# Patient Record
Sex: Male | Born: 1975 | Race: White | Hispanic: No | Smoking: Current every day smoker
Health system: Southern US, Community
[De-identification: ages and names within clinical notes are randomized; demographics above are authoritative.]

## PROBLEM LIST (undated history)

## (undated) DIAGNOSIS — Z8616 Personal history of COVID-19: Secondary | ICD-10-CM

## (undated) DIAGNOSIS — I639 Cerebral infarction, unspecified: Secondary | ICD-10-CM

## (undated) DIAGNOSIS — Z87891 Personal history of nicotine dependence: Secondary | ICD-10-CM

## (undated) DIAGNOSIS — G43909 Migraine, unspecified, not intractable, without status migrainosus: Secondary | ICD-10-CM

## (undated) DIAGNOSIS — Q249 Congenital malformation of heart, unspecified: Secondary | ICD-10-CM

## (undated) DIAGNOSIS — R51 Headache: Secondary | ICD-10-CM

## (undated) DIAGNOSIS — F419 Anxiety disorder, unspecified: Secondary | ICD-10-CM

## (undated) DIAGNOSIS — I10 Essential (primary) hypertension: Secondary | ICD-10-CM

## (undated) DIAGNOSIS — Z8673 Personal history of transient ischemic attack (TIA), and cerebral infarction without residual deficits: Secondary | ICD-10-CM

## (undated) DIAGNOSIS — M542 Cervicalgia: Secondary | ICD-10-CM

## (undated) DIAGNOSIS — Q210 Ventricular septal defect: Secondary | ICD-10-CM

## (undated) DIAGNOSIS — F319 Bipolar disorder, unspecified: Secondary | ICD-10-CM

## (undated) DIAGNOSIS — J449 Chronic obstructive pulmonary disease, unspecified: Secondary | ICD-10-CM

## (undated) DIAGNOSIS — Z720 Tobacco use: Secondary | ICD-10-CM

## (undated) DIAGNOSIS — R519 Headache, unspecified: Secondary | ICD-10-CM

## (undated) DIAGNOSIS — R002 Palpitations: Secondary | ICD-10-CM

## (undated) HISTORY — DX: Palpitations: R00.2

## (undated) HISTORY — PX: AMPUTATION FINGER / THUMB: SUR24

## (undated) HISTORY — DX: Cervicalgia: M54.2

## (undated) HISTORY — DX: Chronic obstructive pulmonary disease, unspecified: J44.9

## (undated) HISTORY — DX: Migraine, unspecified, not intractable, without status migrainosus: G43.909

## (undated) HISTORY — DX: Personal history of transient ischemic attack (TIA), and cerebral infarction without residual deficits: Z86.73

## (undated) HISTORY — DX: Headache, unspecified: R51.9

## (undated) HISTORY — PX: CARDIAC SURGERY: SHX584

## (undated) HISTORY — DX: Personal history of nicotine dependence: Z87.891

## (undated) HISTORY — DX: Tobacco use: Z72.0

## (undated) HISTORY — DX: Congenital malformation of heart, unspecified: Q24.9

## (undated) HISTORY — DX: Headache: R51

## (undated) HISTORY — DX: Bipolar disorder, unspecified: F31.9

## (undated) HISTORY — DX: Essential (primary) hypertension: I10

## (undated) HISTORY — DX: Personal history of COVID-19: Z86.16

## (undated) HISTORY — DX: Ventricular septal defect: Q21.0

## (undated) HISTORY — DX: Anxiety disorder, unspecified: F41.9

---

## 1997-12-09 ENCOUNTER — Emergency Department (HOSPITAL_COMMUNITY): Admission: EM | Admit: 1997-12-09 | Discharge: 1997-12-09 | Payer: Self-pay

## 2000-02-18 ENCOUNTER — Emergency Department (HOSPITAL_COMMUNITY): Admission: EM | Admit: 2000-02-18 | Discharge: 2000-02-18 | Payer: Self-pay | Admitting: Emergency Medicine

## 2001-01-30 ENCOUNTER — Encounter: Payer: Self-pay | Admitting: Emergency Medicine

## 2001-01-30 ENCOUNTER — Emergency Department (HOSPITAL_COMMUNITY): Admission: EM | Admit: 2001-01-30 | Discharge: 2001-01-30 | Payer: Self-pay | Admitting: Emergency Medicine

## 2001-03-22 ENCOUNTER — Emergency Department (HOSPITAL_COMMUNITY): Admission: EM | Admit: 2001-03-22 | Discharge: 2001-03-23 | Payer: Self-pay | Admitting: Emergency Medicine

## 2001-03-23 ENCOUNTER — Encounter: Payer: Self-pay | Admitting: Emergency Medicine

## 2001-09-23 ENCOUNTER — Encounter: Payer: Self-pay | Admitting: Emergency Medicine

## 2001-09-23 ENCOUNTER — Emergency Department (HOSPITAL_COMMUNITY): Admission: EM | Admit: 2001-09-23 | Discharge: 2001-09-23 | Payer: Self-pay | Admitting: Emergency Medicine

## 2001-11-11 ENCOUNTER — Emergency Department (HOSPITAL_COMMUNITY): Admission: EM | Admit: 2001-11-11 | Discharge: 2001-11-11 | Payer: Self-pay | Admitting: Emergency Medicine

## 2001-11-11 ENCOUNTER — Encounter: Payer: Self-pay | Admitting: Emergency Medicine

## 2001-12-14 ENCOUNTER — Emergency Department (HOSPITAL_COMMUNITY): Admission: EM | Admit: 2001-12-14 | Discharge: 2001-12-14 | Payer: Self-pay | Admitting: *Deleted

## 2002-02-01 ENCOUNTER — Encounter: Payer: Self-pay | Admitting: Emergency Medicine

## 2002-02-01 ENCOUNTER — Emergency Department (HOSPITAL_COMMUNITY): Admission: EM | Admit: 2002-02-01 | Discharge: 2002-02-01 | Payer: Self-pay | Admitting: Emergency Medicine

## 2002-02-19 ENCOUNTER — Emergency Department (HOSPITAL_COMMUNITY): Admission: EM | Admit: 2002-02-19 | Discharge: 2002-02-19 | Payer: Self-pay | Admitting: Emergency Medicine

## 2002-02-19 ENCOUNTER — Encounter: Payer: Self-pay | Admitting: Emergency Medicine

## 2002-05-19 ENCOUNTER — Emergency Department (HOSPITAL_COMMUNITY): Admission: EM | Admit: 2002-05-19 | Discharge: 2002-05-19 | Payer: Self-pay | Admitting: Emergency Medicine

## 2002-05-20 ENCOUNTER — Emergency Department (HOSPITAL_COMMUNITY): Admission: EM | Admit: 2002-05-20 | Discharge: 2002-05-20 | Payer: Self-pay | Admitting: Emergency Medicine

## 2002-08-02 ENCOUNTER — Emergency Department (HOSPITAL_COMMUNITY): Admission: EM | Admit: 2002-08-02 | Discharge: 2002-08-02 | Payer: Self-pay | Admitting: Emergency Medicine

## 2002-08-02 ENCOUNTER — Encounter: Payer: Self-pay | Admitting: Emergency Medicine

## 2002-09-25 ENCOUNTER — Emergency Department (HOSPITAL_COMMUNITY): Admission: EM | Admit: 2002-09-25 | Discharge: 2002-09-25 | Payer: Self-pay | Admitting: Emergency Medicine

## 2002-09-25 ENCOUNTER — Encounter: Payer: Self-pay | Admitting: Emergency Medicine

## 2002-10-10 ENCOUNTER — Encounter: Payer: Self-pay | Admitting: Emergency Medicine

## 2002-10-10 ENCOUNTER — Emergency Department (HOSPITAL_COMMUNITY): Admission: EM | Admit: 2002-10-10 | Discharge: 2002-10-10 | Payer: Self-pay | Admitting: Emergency Medicine

## 2003-08-25 ENCOUNTER — Emergency Department (HOSPITAL_COMMUNITY): Admission: EM | Admit: 2003-08-25 | Discharge: 2003-08-25 | Payer: Self-pay | Admitting: Emergency Medicine

## 2003-08-25 IMAGING — CR DG CHEST 2V
2 series · 2 of 2 positions shown · non-contrast
Comparison: [DATE].

CLINICAL DATA: Cough, fever, cold.
 CHEST, TWO VIEWS [DATE]

[view not recorded (1 of 2)]
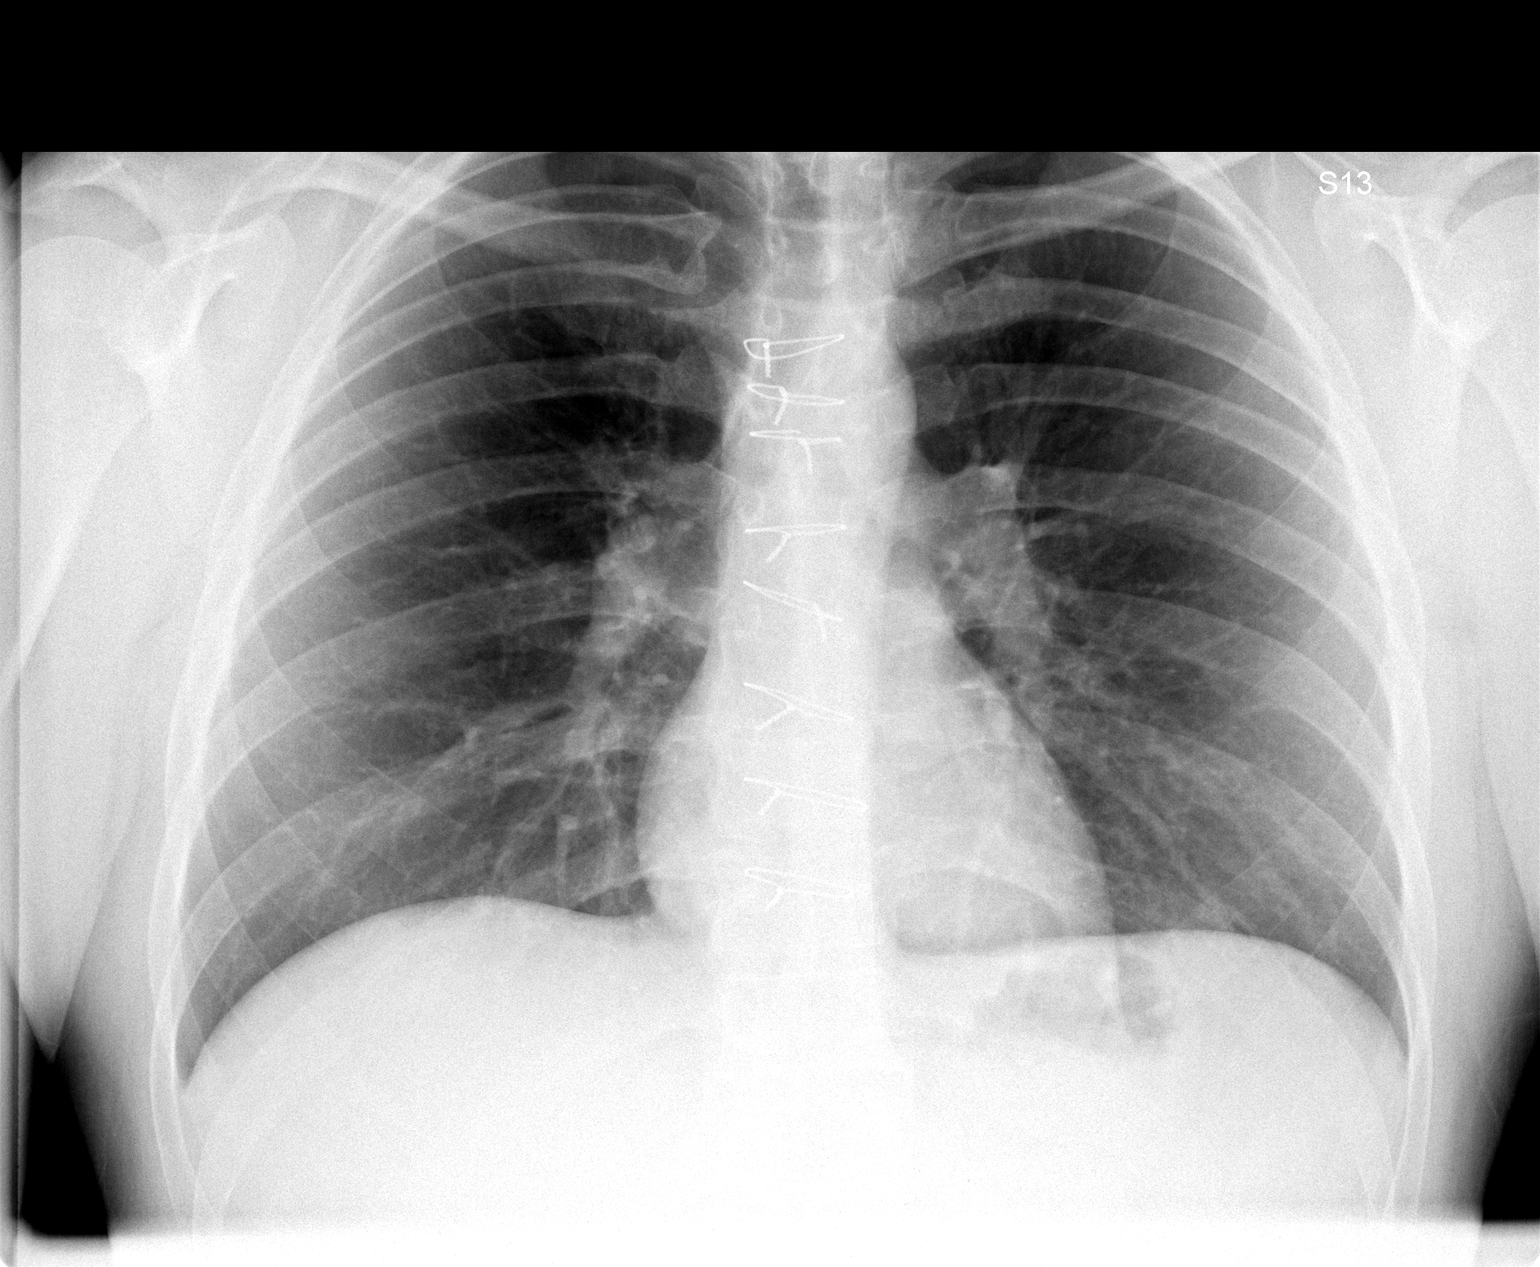

[view not recorded (2 of 2)]
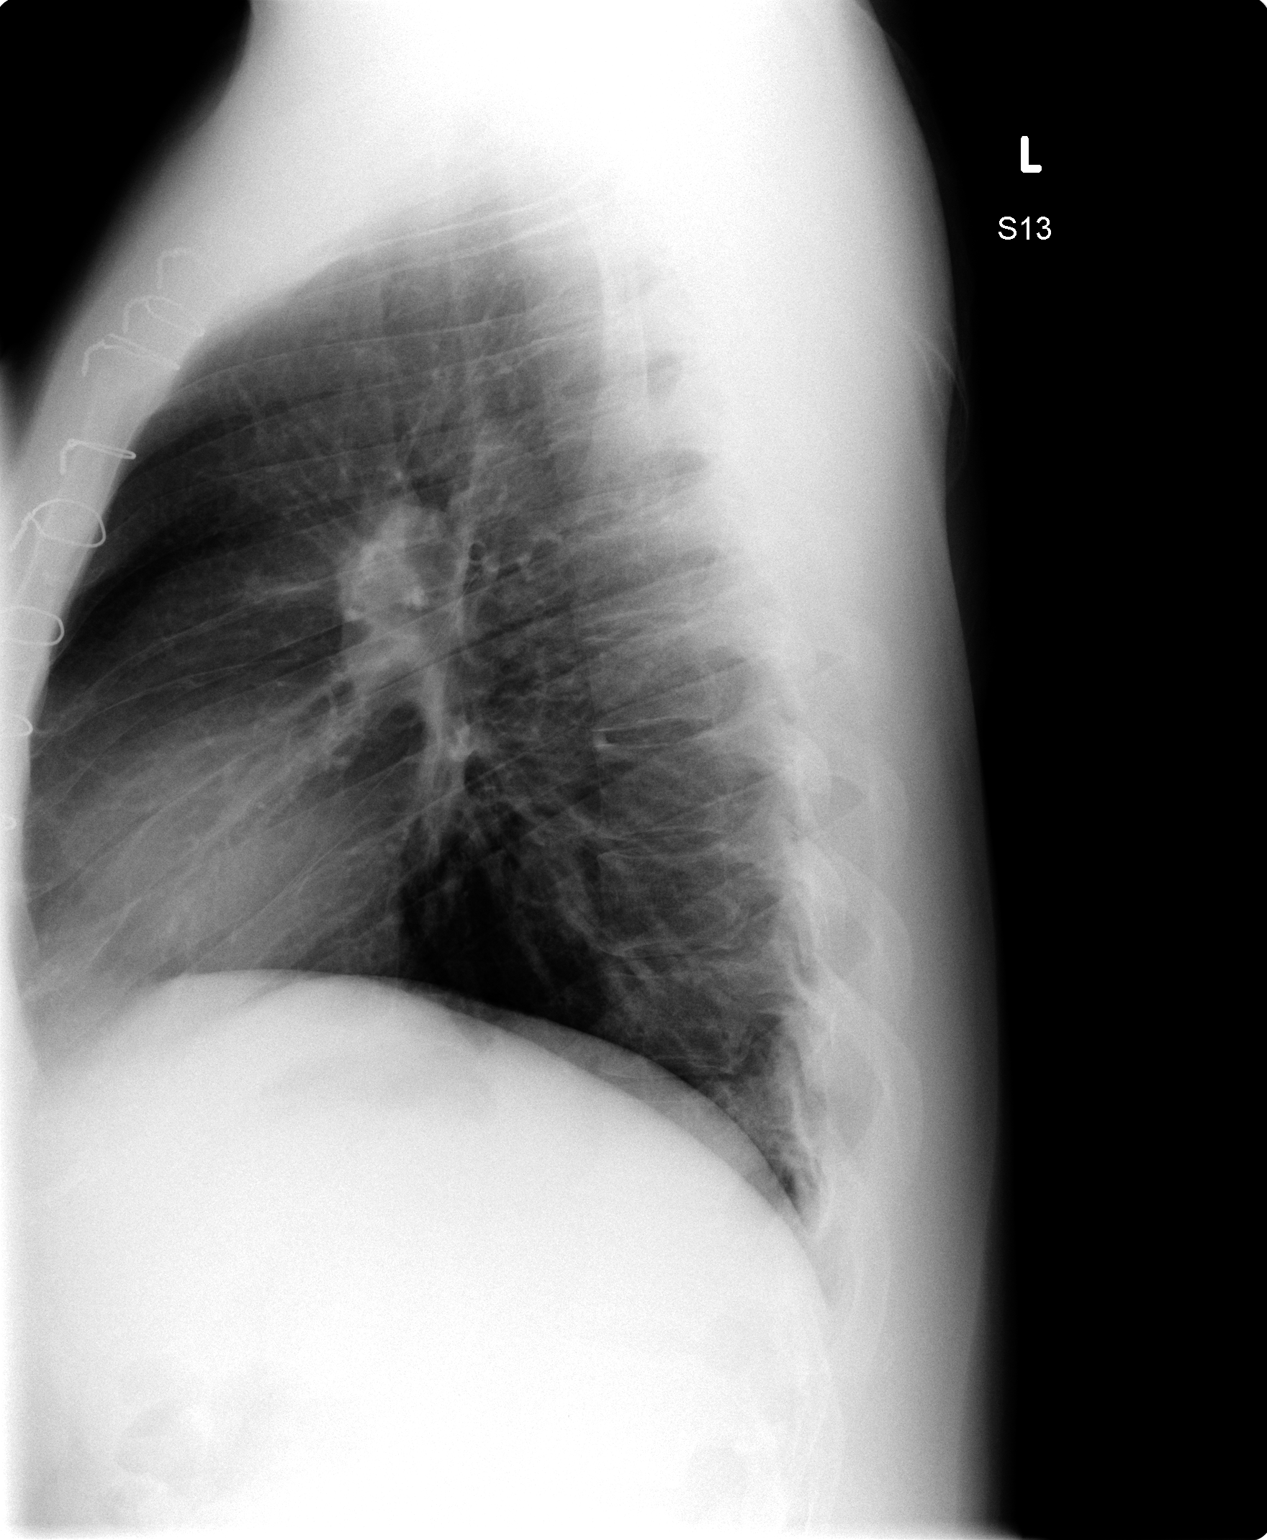

[2 of 2 positions shown; findings below may reference images not displayed]

The patient is status post median sternotomy.  The heart is normal in size.  The lungs are clear.  No effusions.  Visualized skeleton is unremarkable. 
 IMPRESSION
 Status post median sternotomy.  No active disease.

## 2003-11-03 ENCOUNTER — Emergency Department (HOSPITAL_COMMUNITY): Admission: EM | Admit: 2003-11-03 | Discharge: 2003-11-03 | Payer: Self-pay | Admitting: Emergency Medicine

## 2004-02-10 ENCOUNTER — Emergency Department (HOSPITAL_COMMUNITY): Admission: EM | Admit: 2004-02-10 | Discharge: 2004-02-10 | Payer: Self-pay | Admitting: Emergency Medicine

## 2009-04-16 ENCOUNTER — Ambulatory Visit (HOSPITAL_COMMUNITY): Payer: Self-pay | Admitting: Licensed Clinical Social Worker

## 2009-04-30 ENCOUNTER — Ambulatory Visit (HOSPITAL_COMMUNITY): Payer: Self-pay | Admitting: Licensed Clinical Social Worker

## 2009-05-31 ENCOUNTER — Ambulatory Visit (HOSPITAL_COMMUNITY): Payer: Self-pay | Admitting: Licensed Clinical Social Worker

## 2009-06-30 ENCOUNTER — Ambulatory Visit (HOSPITAL_COMMUNITY): Payer: Self-pay | Admitting: Psychiatry

## 2011-01-28 ENCOUNTER — Emergency Department (HOSPITAL_COMMUNITY)
Admission: EM | Admit: 2011-01-28 | Discharge: 2011-01-28 | Disposition: A | Discharge status: Home / Self Care | Payer: Medicare Other | Attending: Emergency Medicine | Admitting: Emergency Medicine

## 2011-01-28 ENCOUNTER — Emergency Department (HOSPITAL_COMMUNITY): Payer: Medicare Other

## 2011-01-28 DIAGNOSIS — J189 Pneumonia, unspecified organism: Secondary | ICD-10-CM | POA: Insufficient documentation

## 2011-01-28 DIAGNOSIS — I252 Old myocardial infarction: Secondary | ICD-10-CM | POA: Insufficient documentation

## 2011-01-28 DIAGNOSIS — Z86718 Personal history of other venous thrombosis and embolism: Secondary | ICD-10-CM | POA: Insufficient documentation

## 2011-01-28 DIAGNOSIS — R509 Fever, unspecified: Secondary | ICD-10-CM | POA: Insufficient documentation

## 2011-01-28 DIAGNOSIS — Z7982 Long term (current) use of aspirin: Secondary | ICD-10-CM | POA: Insufficient documentation

## 2011-01-28 DIAGNOSIS — IMO0001 Reserved for inherently not codable concepts without codable children: Secondary | ICD-10-CM | POA: Insufficient documentation

## 2011-01-28 LAB — CBC
Hemoglobin: 15 g/dL (ref 13.0–17.0)
MCH: 30.5 pg (ref 26.0–34.0)
MCHC: 35.7 g/dL (ref 30.0–36.0)
MCV: 85.5 fL (ref 78.0–100.0)
Platelets: 150 10*3/uL (ref 150–400)
RBC: 4.91 MIL/uL (ref 4.22–5.81)

## 2011-01-28 LAB — DIFFERENTIAL
Eosinophils Absolute: 0.3 10*3/uL (ref 0.0–0.7)
Lymphs Abs: 1.3 10*3/uL (ref 0.7–4.0)
Monocytes Absolute: 1.4 10*3/uL — ABNORMAL HIGH (ref 0.1–1.0)
Monocytes Relative: 13 % — ABNORMAL HIGH (ref 3–12)
Neutrophils Relative %: 73 % (ref 43–77)

## 2013-07-14 DIAGNOSIS — M545 Low back pain, unspecified: Secondary | ICD-10-CM

## 2013-07-14 HISTORY — DX: Low back pain, unspecified: M54.50

## 2014-08-19 DIAGNOSIS — S29019A Strain of muscle and tendon of unspecified wall of thorax, initial encounter: Secondary | ICD-10-CM | POA: Diagnosis not present

## 2014-08-19 DIAGNOSIS — Y999 Unspecified external cause status: Secondary | ICD-10-CM | POA: Diagnosis not present

## 2014-08-19 DIAGNOSIS — Z7982 Long term (current) use of aspirin: Secondary | ICD-10-CM | POA: Diagnosis not present

## 2014-08-19 DIAGNOSIS — I10 Essential (primary) hypertension: Secondary | ICD-10-CM | POA: Diagnosis not present

## 2015-01-28 DIAGNOSIS — J0101 Acute recurrent maxillary sinusitis: Secondary | ICD-10-CM | POA: Diagnosis not present

## 2015-01-28 DIAGNOSIS — I252 Old myocardial infarction: Secondary | ICD-10-CM | POA: Diagnosis not present

## 2015-01-28 DIAGNOSIS — F17210 Nicotine dependence, cigarettes, uncomplicated: Secondary | ICD-10-CM | POA: Diagnosis not present

## 2015-01-28 DIAGNOSIS — Z720 Tobacco use: Secondary | ICD-10-CM | POA: Diagnosis not present

## 2015-01-28 DIAGNOSIS — Z8673 Personal history of transient ischemic attack (TIA), and cerebral infarction without residual deficits: Secondary | ICD-10-CM | POA: Diagnosis not present

## 2015-01-28 DIAGNOSIS — Z7982 Long term (current) use of aspirin: Secondary | ICD-10-CM | POA: Diagnosis not present

## 2015-01-28 DIAGNOSIS — Z89011 Acquired absence of right thumb: Secondary | ICD-10-CM | POA: Diagnosis not present

## 2015-02-01 ENCOUNTER — Encounter (HOSPITAL_COMMUNITY): Payer: Self-pay | Admitting: *Deleted

## 2015-02-01 ENCOUNTER — Emergency Department (HOSPITAL_COMMUNITY)
Admission: EM | Admit: 2015-02-01 | Discharge: 2015-02-01 | Disposition: A | Discharge status: Home / Self Care | Payer: Commercial Managed Care - HMO | Attending: Emergency Medicine | Admitting: Emergency Medicine

## 2015-02-01 DIAGNOSIS — Q249 Congenital malformation of heart, unspecified: Secondary | ICD-10-CM | POA: Insufficient documentation

## 2015-02-01 DIAGNOSIS — Z720 Tobacco use: Secondary | ICD-10-CM | POA: Diagnosis not present

## 2015-02-01 DIAGNOSIS — K088 Other specified disorders of teeth and supporting structures: Secondary | ICD-10-CM | POA: Insufficient documentation

## 2015-02-01 DIAGNOSIS — Z8673 Personal history of transient ischemic attack (TIA), and cerebral infarction without residual deficits: Secondary | ICD-10-CM | POA: Diagnosis not present

## 2015-02-01 DIAGNOSIS — K0889 Other specified disorders of teeth and supporting structures: Secondary | ICD-10-CM

## 2015-02-01 HISTORY — DX: Cerebral infarction, unspecified: I63.9

## 2015-02-01 HISTORY — DX: Congenital malformation of heart, unspecified: Q24.9

## 2015-02-01 NOTE — ED Notes (Signed)
PT family reports 3-2 week Hx of dental pain. Pt also having a sinus infection. Pt is currently taking anti BX for the sinus infection. Pt was last seen at [facility].

## 2015-02-01 NOTE — ED Provider Notes (Signed)
CSN: [id number]     Arrival date & time 02/01/15  1006 History  This chart was scribed for non-physician practitioner, Montine Circle, PA-C working with Quintella Reichert, MD by Hansel Feinstein, ED scribe. This patient was seen in room TR09C/TR09C and the patient's care was started at 11:16 AM    Chief Complaint  Patient presents with  . Dental Problem   The history is provided by the patient. No language interpreter was used.    HPI Comments: Roy Koch is a 39 y.o. male who presents to the Emergency Department with a chief complaint of moderate lower left dental pain onset 3 weeks ago. He is currently on 500mg  amoxicillin for a sinus infection. Pt has also tried warm compress, taken 800mg  ibuprofen, Vicodin, motrin with no relief. Pt is not currently followed by a dentist. He denies fever, other symptoms.   Past Medical History  Diagnosis Date  . Stroke   . Cardiac abnormality    Past Surgical History  Procedure Laterality Date  . Cardiac surgery  at 39 years old   History reviewed. No pertinent family history. Social History  Substance Use Topics  . Smoking status: Current Every Day Smoker    Types: Cigarettes  . Smokeless tobacco: Never Used  . Alcohol Use: No    Review of Systems  Constitutional: Negative for fever.  HENT: Positive for dental problem.    Allergies  Review of patient's allergies indicates no known allergies.  Home Medications   Prior to Admission medications   Not on File   BP 138/98 mmHg  Pulse 61  Temp(Src) 97.9 F (36.6 C) (Oral)  Resp 12  Ht 6' (1.829 m)  Wt 220 lb (99.791 kg)  BMI 29.83 kg/m2  SpO2 96% Physical Exam  Constitutional: He is oriented to person, place, and time. He appears well-developed and well-nourished.  HENT:  Head: Normocephalic and atraumatic.  Terrible dentition throughout  No signs of peritonsillar or tonsillar abscess.  No signs of gingival abscess. Oropharynx is clear and without exudates.  Uvula is midline.  Airway  is intact. No signs of Ludwig's angina with palpation of oral and sublingual mucosa.   Eyes: Conjunctivae and EOM are normal. Pupils are equal, round, and reactive to light.  Neck: Normal range of motion. Neck supple.  Cardiovascular: Normal rate.   Pulmonary/Chest: Effort normal. No respiratory distress.  Abdominal: He exhibits no distension.  Musculoskeletal: Normal range of motion.  Neurological: He is alert and oriented to person, place, and time.  Skin: Skin is warm and dry.  Psychiatric: He has a normal mood and affect. His behavior is normal.  Nursing note and vitals reviewed.  ED Course  Dental Date/Time: 02/01/2015 11:38 AM Performed by: Montine Circle Authorized by: Montine Circle Consent: Verbal consent obtained. Risks and benefits: risks, benefits and alternatives were discussed Consent given by: patient Patient understanding: patient states understanding of the procedure being performed Patient consent: the patient's understanding of the procedure matches consent given Procedure consent: procedure consent matches procedure scheduled Relevant documents: relevant documents present and verified Test results: test results available and properly labeled Site marked: the operative site was marked Imaging studies: imaging studies available Required items: required blood products, implants, devices, and special equipment available Patient identity confirmed: verbally with patient Time out: Immediately prior to procedure a "time out" was called to verify the correct patient, procedure, equipment, support staff and site/side marked as required. Local anesthesia used: yes Anesthesia: local infiltration Local anesthetic: bupivacaine 0.25% with epinephrine  Anesthetic total: 1.8 ml Patient sedated: no Patient tolerance: Patient tolerated the procedure well with no immediate complications   (including critical care time) DIAGNOSTIC STUDIES: Oxygen Saturation is 96% on RA,  adequate by my interpretation.    COORDINATION OF CARE: 11:18 AM Discussed treatment plan with pt at bedside and pt agreed to plan.     MDM   Final diagnoses:  Pain, dental    Patient with toothache.  No gross abscess.  Exam unconcerning for Ludwig's angina or spread of infection.  Patient currently being treated with amoxicillin. Request dental block.  Urged patient to follow-up with dentist.    I personally performed the services described in this documentation, which was scribed in my presence. The recorded information has been reviewed and is accurate.    Adon Gehlhausen, PA-C 02/01/15 [place], MD 02/01/15 [phone]

## 2015-02-01 NOTE — ED Notes (Signed)
PA at the bedside.

## 2015-02-01 NOTE — Discharge Instructions (Signed)

## 2015-02-01 NOTE — ED Notes (Signed)
Declined W/C at D/C and was escorted to lobby by RN. 

## 2015-02-22 DIAGNOSIS — I252 Old myocardial infarction: Secondary | ICD-10-CM | POA: Diagnosis not present

## 2015-02-22 DIAGNOSIS — F17210 Nicotine dependence, cigarettes, uncomplicated: Secondary | ICD-10-CM | POA: Diagnosis not present

## 2015-02-22 DIAGNOSIS — K088 Other specified disorders of teeth and supporting structures: Secondary | ICD-10-CM | POA: Diagnosis not present

## 2015-02-22 DIAGNOSIS — Z8673 Personal history of transient ischemic attack (TIA), and cerebral infarction without residual deficits: Secondary | ICD-10-CM | POA: Diagnosis not present

## 2015-02-22 DIAGNOSIS — Z7982 Long term (current) use of aspirin: Secondary | ICD-10-CM | POA: Diagnosis not present

## 2015-04-19 DIAGNOSIS — J449 Chronic obstructive pulmonary disease, unspecified: Secondary | ICD-10-CM | POA: Diagnosis not present

## 2015-04-19 DIAGNOSIS — Z6830 Body mass index (BMI) 30.0-30.9, adult: Secondary | ICD-10-CM | POA: Diagnosis not present

## 2015-04-19 DIAGNOSIS — J019 Acute sinusitis, unspecified: Secondary | ICD-10-CM | POA: Diagnosis not present

## 2015-04-19 DIAGNOSIS — Z1389 Encounter for screening for other disorder: Secondary | ICD-10-CM | POA: Diagnosis not present

## 2015-05-11 DIAGNOSIS — Z6829 Body mass index (BMI) 29.0-29.9, adult: Secondary | ICD-10-CM | POA: Diagnosis not present

## 2015-05-11 DIAGNOSIS — F12920 Cannabis use, unspecified with intoxication, uncomplicated: Secondary | ICD-10-CM | POA: Diagnosis not present

## 2015-05-11 DIAGNOSIS — R030 Elevated blood-pressure reading, without diagnosis of hypertension: Secondary | ICD-10-CM | POA: Diagnosis not present

## 2015-05-11 DIAGNOSIS — F3110 Bipolar disorder, current episode manic without psychotic features, unspecified: Secondary | ICD-10-CM | POA: Diagnosis not present

## 2015-05-11 DIAGNOSIS — Z0000 Encounter for general adult medical examination without abnormal findings: Secondary | ICD-10-CM | POA: Diagnosis not present

## 2015-05-11 DIAGNOSIS — S68511S Complete traumatic transphalangeal amputation of right thumb, sequela: Secondary | ICD-10-CM | POA: Diagnosis not present

## 2015-05-11 DIAGNOSIS — Z8673 Personal history of transient ischemic attack (TIA), and cerebral infarction without residual deficits: Secondary | ICD-10-CM | POA: Diagnosis not present

## 2015-05-11 DIAGNOSIS — J449 Chronic obstructive pulmonary disease, unspecified: Secondary | ICD-10-CM | POA: Diagnosis not present

## 2015-06-11 DIAGNOSIS — Z6830 Body mass index (BMI) 30.0-30.9, adult: Secondary | ICD-10-CM | POA: Diagnosis not present

## 2015-06-11 DIAGNOSIS — J441 Chronic obstructive pulmonary disease with (acute) exacerbation: Secondary | ICD-10-CM | POA: Diagnosis not present

## 2015-06-11 DIAGNOSIS — Z1389 Encounter for screening for other disorder: Secondary | ICD-10-CM | POA: Diagnosis not present

## 2015-06-11 DIAGNOSIS — E669 Obesity, unspecified: Secondary | ICD-10-CM | POA: Diagnosis not present

## 2015-06-11 DIAGNOSIS — Z720 Tobacco use: Secondary | ICD-10-CM | POA: Diagnosis not present

## 2015-06-23 DIAGNOSIS — J40 Bronchitis, not specified as acute or chronic: Secondary | ICD-10-CM | POA: Diagnosis not present

## 2015-06-23 DIAGNOSIS — R071 Chest pain on breathing: Secondary | ICD-10-CM | POA: Diagnosis not present

## 2015-06-23 DIAGNOSIS — Z720 Tobacco use: Secondary | ICD-10-CM | POA: Diagnosis not present

## 2015-06-23 DIAGNOSIS — F17210 Nicotine dependence, cigarettes, uncomplicated: Secondary | ICD-10-CM | POA: Diagnosis not present

## 2015-06-23 DIAGNOSIS — R0789 Other chest pain: Secondary | ICD-10-CM | POA: Diagnosis not present

## 2015-06-23 DIAGNOSIS — R05 Cough: Secondary | ICD-10-CM | POA: Diagnosis not present

## 2015-06-23 DIAGNOSIS — Z7982 Long term (current) use of aspirin: Secondary | ICD-10-CM | POA: Diagnosis not present

## 2015-06-23 DIAGNOSIS — Z8673 Personal history of transient ischemic attack (TIA), and cerebral infarction without residual deficits: Secondary | ICD-10-CM | POA: Diagnosis not present

## 2015-06-23 DIAGNOSIS — I252 Old myocardial infarction: Secondary | ICD-10-CM | POA: Diagnosis not present

## 2015-06-23 DIAGNOSIS — I4510 Unspecified right bundle-branch block: Secondary | ICD-10-CM | POA: Diagnosis not present

## 2015-06-24 DIAGNOSIS — R05 Cough: Secondary | ICD-10-CM | POA: Diagnosis not present

## 2015-08-21 DIAGNOSIS — S2231XA Fracture of one rib, right side, initial encounter for closed fracture: Secondary | ICD-10-CM | POA: Diagnosis not present

## 2015-08-21 DIAGNOSIS — I252 Old myocardial infarction: Secondary | ICD-10-CM | POA: Diagnosis not present

## 2015-08-21 DIAGNOSIS — Z8673 Personal history of transient ischemic attack (TIA), and cerebral infarction without residual deficits: Secondary | ICD-10-CM | POA: Diagnosis not present

## 2015-08-21 DIAGNOSIS — Z7982 Long term (current) use of aspirin: Secondary | ICD-10-CM | POA: Diagnosis not present

## 2015-08-21 DIAGNOSIS — X58XXXA Exposure to other specified factors, initial encounter: Secondary | ICD-10-CM | POA: Diagnosis not present

## 2015-08-21 DIAGNOSIS — J441 Chronic obstructive pulmonary disease with (acute) exacerbation: Secondary | ICD-10-CM | POA: Diagnosis not present

## 2015-08-21 DIAGNOSIS — F17210 Nicotine dependence, cigarettes, uncomplicated: Secondary | ICD-10-CM | POA: Diagnosis not present

## 2015-08-21 DIAGNOSIS — Z720 Tobacco use: Secondary | ICD-10-CM | POA: Diagnosis not present

## 2015-08-22 DIAGNOSIS — S2231XA Fracture of one rib, right side, initial encounter for closed fracture: Secondary | ICD-10-CM | POA: Diagnosis not present

## 2015-08-31 DIAGNOSIS — S2231XA Fracture of one rib, right side, initial encounter for closed fracture: Secondary | ICD-10-CM | POA: Diagnosis not present

## 2015-08-31 DIAGNOSIS — Z6831 Body mass index (BMI) 31.0-31.9, adult: Secondary | ICD-10-CM | POA: Diagnosis not present

## 2015-08-31 DIAGNOSIS — J449 Chronic obstructive pulmonary disease, unspecified: Secondary | ICD-10-CM | POA: Diagnosis not present

## 2015-10-28 DIAGNOSIS — F17210 Nicotine dependence, cigarettes, uncomplicated: Secondary | ICD-10-CM | POA: Diagnosis not present

## 2015-10-28 DIAGNOSIS — K047 Periapical abscess without sinus: Secondary | ICD-10-CM | POA: Diagnosis not present

## 2015-10-28 DIAGNOSIS — K0889 Other specified disorders of teeth and supporting structures: Secondary | ICD-10-CM | POA: Diagnosis not present

## 2015-10-28 DIAGNOSIS — I252 Old myocardial infarction: Secondary | ICD-10-CM | POA: Diagnosis not present

## 2015-10-28 DIAGNOSIS — Z8673 Personal history of transient ischemic attack (TIA), and cerebral infarction without residual deficits: Secondary | ICD-10-CM | POA: Diagnosis not present

## 2015-10-28 DIAGNOSIS — Z9889 Other specified postprocedural states: Secondary | ICD-10-CM | POA: Diagnosis not present

## 2015-10-28 DIAGNOSIS — Z886 Allergy status to analgesic agent status: Secondary | ICD-10-CM | POA: Diagnosis not present

## 2015-10-28 DIAGNOSIS — Z89011 Acquired absence of right thumb: Secondary | ICD-10-CM | POA: Diagnosis not present

## 2015-10-28 DIAGNOSIS — H65191 Other acute nonsuppurative otitis media, right ear: Secondary | ICD-10-CM | POA: Diagnosis not present

## 2015-12-25 DIAGNOSIS — I252 Old myocardial infarction: Secondary | ICD-10-CM | POA: Diagnosis not present

## 2015-12-25 DIAGNOSIS — M50323 Other cervical disc degeneration at C6-C7 level: Secondary | ICD-10-CM | POA: Diagnosis not present

## 2015-12-25 DIAGNOSIS — X500XXA Overexertion from strenuous movement or load, initial encounter: Secondary | ICD-10-CM | POA: Diagnosis not present

## 2015-12-25 DIAGNOSIS — M47814 Spondylosis without myelopathy or radiculopathy, thoracic region: Secondary | ICD-10-CM | POA: Diagnosis not present

## 2015-12-25 DIAGNOSIS — S29012A Strain of muscle and tendon of back wall of thorax, initial encounter: Secondary | ICD-10-CM | POA: Diagnosis not present

## 2015-12-25 DIAGNOSIS — S161XXA Strain of muscle, fascia and tendon at neck level, initial encounter: Secondary | ICD-10-CM | POA: Diagnosis not present

## 2015-12-25 DIAGNOSIS — F17210 Nicotine dependence, cigarettes, uncomplicated: Secondary | ICD-10-CM | POA: Diagnosis not present

## 2015-12-25 DIAGNOSIS — Z9889 Other specified postprocedural states: Secondary | ICD-10-CM | POA: Diagnosis not present

## 2015-12-25 DIAGNOSIS — M5134 Other intervertebral disc degeneration, thoracic region: Secondary | ICD-10-CM | POA: Diagnosis not present

## 2015-12-25 DIAGNOSIS — Z8673 Personal history of transient ischemic attack (TIA), and cerebral infarction without residual deficits: Secondary | ICD-10-CM | POA: Diagnosis not present

## 2015-12-25 DIAGNOSIS — S29019A Strain of muscle and tendon of unspecified wall of thorax, initial encounter: Secondary | ICD-10-CM | POA: Diagnosis not present

## 2016-01-23 DIAGNOSIS — I252 Old myocardial infarction: Secondary | ICD-10-CM | POA: Diagnosis not present

## 2016-01-23 DIAGNOSIS — F17210 Nicotine dependence, cigarettes, uncomplicated: Secondary | ICD-10-CM | POA: Diagnosis not present

## 2016-01-23 DIAGNOSIS — J209 Acute bronchitis, unspecified: Secondary | ICD-10-CM | POA: Diagnosis not present

## 2016-01-23 DIAGNOSIS — Z7982 Long term (current) use of aspirin: Secondary | ICD-10-CM | POA: Diagnosis not present

## 2016-01-23 DIAGNOSIS — Z8673 Personal history of transient ischemic attack (TIA), and cerebral infarction without residual deficits: Secondary | ICD-10-CM | POA: Diagnosis not present

## 2016-01-23 DIAGNOSIS — Z720 Tobacco use: Secondary | ICD-10-CM | POA: Diagnosis not present

## 2016-01-23 DIAGNOSIS — R0602 Shortness of breath: Secondary | ICD-10-CM | POA: Diagnosis not present

## 2016-01-27 DIAGNOSIS — Z79899 Other long term (current) drug therapy: Secondary | ICD-10-CM | POA: Diagnosis not present

## 2016-01-27 DIAGNOSIS — M5412 Radiculopathy, cervical region: Secondary | ICD-10-CM | POA: Diagnosis not present

## 2016-01-27 DIAGNOSIS — J441 Chronic obstructive pulmonary disease with (acute) exacerbation: Secondary | ICD-10-CM | POA: Diagnosis not present

## 2016-01-27 DIAGNOSIS — M25511 Pain in right shoulder: Secondary | ICD-10-CM | POA: Diagnosis not present

## 2016-01-27 DIAGNOSIS — G4700 Insomnia, unspecified: Secondary | ICD-10-CM | POA: Diagnosis not present

## 2016-08-17 DIAGNOSIS — J069 Acute upper respiratory infection, unspecified: Secondary | ICD-10-CM | POA: Diagnosis not present

## 2016-08-17 DIAGNOSIS — J029 Acute pharyngitis, unspecified: Secondary | ICD-10-CM | POA: Diagnosis not present

## 2016-08-17 DIAGNOSIS — Z6831 Body mass index (BMI) 31.0-31.9, adult: Secondary | ICD-10-CM | POA: Diagnosis not present

## 2016-08-17 DIAGNOSIS — J020 Streptococcal pharyngitis: Secondary | ICD-10-CM | POA: Diagnosis not present

## 2016-12-23 DIAGNOSIS — R9431 Abnormal electrocardiogram [ECG] [EKG]: Secondary | ICD-10-CM | POA: Diagnosis not present

## 2016-12-23 DIAGNOSIS — R0789 Other chest pain: Secondary | ICD-10-CM | POA: Diagnosis not present

## 2016-12-23 DIAGNOSIS — I252 Old myocardial infarction: Secondary | ICD-10-CM | POA: Diagnosis not present

## 2016-12-23 DIAGNOSIS — J984 Other disorders of lung: Secondary | ICD-10-CM | POA: Diagnosis not present

## 2016-12-23 DIAGNOSIS — F17210 Nicotine dependence, cigarettes, uncomplicated: Secondary | ICD-10-CM | POA: Diagnosis not present

## 2016-12-23 DIAGNOSIS — Z7982 Long term (current) use of aspirin: Secondary | ICD-10-CM | POA: Diagnosis not present

## 2016-12-23 DIAGNOSIS — I4510 Unspecified right bundle-branch block: Secondary | ICD-10-CM | POA: Diagnosis not present

## 2016-12-23 DIAGNOSIS — R001 Bradycardia, unspecified: Secondary | ICD-10-CM | POA: Diagnosis not present

## 2016-12-23 DIAGNOSIS — R079 Chest pain, unspecified: Secondary | ICD-10-CM | POA: Diagnosis not present

## 2016-12-23 DIAGNOSIS — J9811 Atelectasis: Secondary | ICD-10-CM | POA: Diagnosis not present

## 2016-12-23 DIAGNOSIS — Z8673 Personal history of transient ischemic attack (TIA), and cerebral infarction without residual deficits: Secondary | ICD-10-CM | POA: Diagnosis not present

## 2017-02-07 DIAGNOSIS — Z6830 Body mass index (BMI) 30.0-30.9, adult: Secondary | ICD-10-CM | POA: Diagnosis not present

## 2017-02-07 DIAGNOSIS — Z716 Tobacco abuse counseling: Secondary | ICD-10-CM | POA: Diagnosis not present

## 2017-02-07 DIAGNOSIS — H6012 Cellulitis of left external ear: Secondary | ICD-10-CM | POA: Diagnosis not present

## 2017-02-12 DIAGNOSIS — H6012 Cellulitis of left external ear: Secondary | ICD-10-CM | POA: Diagnosis not present

## 2017-02-12 DIAGNOSIS — Z6831 Body mass index (BMI) 31.0-31.9, adult: Secondary | ICD-10-CM | POA: Diagnosis not present

## 2017-02-12 DIAGNOSIS — H60392 Other infective otitis externa, left ear: Secondary | ICD-10-CM | POA: Diagnosis not present

## 2017-02-21 DIAGNOSIS — K029 Dental caries, unspecified: Secondary | ICD-10-CM | POA: Diagnosis not present

## 2017-02-21 DIAGNOSIS — Z6830 Body mass index (BMI) 30.0-30.9, adult: Secondary | ICD-10-CM | POA: Diagnosis not present

## 2017-02-21 DIAGNOSIS — Q249 Congenital malformation of heart, unspecified: Secondary | ICD-10-CM | POA: Diagnosis not present

## 2017-03-07 ENCOUNTER — Other Ambulatory Visit: Payer: Self-pay

## 2017-03-07 NOTE — Patient Outreach (Signed)
[facility]) Care Management  03/07/2017  Roy Koch 11-Jun-1975 [id number] TELEPHONE SCREENING Referral date: 02/27/17 Referral source: primary MD  Referral reason: Psych needs, bipolar, intermittent explosive.  Social work needs.  Insurance: [employer]  Telephone call to patient regarding primary MD referral. Contact answering phone states patient is not at home.  HIPAA compliant message left with call back phone number.   PLAN: RNCM will attempt 2nd telephone outreach to patient within 1 week.   Quinn Plowman RN,BSN,CCM [facility] Telephonic  [phone]

## 2017-03-12 ENCOUNTER — Other Ambulatory Visit: Payer: Self-pay

## 2017-03-12 NOTE — Patient Outreach (Signed)
[facility]) Care Management  03/12/2017  Roy Koch Nov 29, 1975 [id number]  TELEPHONE SCREENING Referral date:02/27/17 Referral source: primary MD Referral reason: psych needs, patient has bipolar intermittent explosive disorder, needs social worker follow up   Insurance: [employer] Patient gives verbal authorization to speak with his mother, Roy Koch regarding any of her persona health information. Her contact phone number is [phone]  SUBJECTIVE: Telephone call to patient regarding primary MD referral. HIPAA verified. Discussed and offered [facility] care management services. Patient verbally agreed to services. Patient states he is suppose to see a therapist but  Is having a hard time finding one that handles his type of situation. Patient states he has bipolar intermittent explosive disorder. Patient state he is suppose to be on medications but is not at this time due to not having a therapist.  Patient states he would like to get back on his medications so that he can continueto live by himself.  Patient state he had a therapist but they now only see patients with addiction problems. Patient states he has disability and is able to live by himself. Patient states his mother helps him with paperwork and his bills.  Patient reports he has had heart surgery in the past and has had 2 stroke. Patient states he had a stroke when he was 41 years old. Patient states the doctors feel he developed the bipolar intermittent explosive disorder due to the stroke.  Patient verbally agreed to have [place] work services. Patient denies having any additional needs at this time.   PROVIDERS: Aram Candela, pa  SOCIAL/ SUPPORTIVE CARE: Roy Koch, mother. Patient lives alone.   PLAN: RNCM will refer patient to Education officer, museum.  Quinn Plowman RN,BSN,CCM [facility] Telephonic  [phone]

## 2017-03-30 ENCOUNTER — Ambulatory Visit: Payer: Self-pay | Admitting: *Deleted

## 2017-04-02 ENCOUNTER — Ambulatory Visit: Payer: Self-pay | Admitting: *Deleted

## 2017-04-10 ENCOUNTER — Other Ambulatory Visit: Payer: Self-pay | Admitting: *Deleted

## 2017-04-12 ENCOUNTER — Ambulatory Visit: Payer: Self-pay | Admitting: *Deleted

## 2017-04-25 DIAGNOSIS — M5416 Radiculopathy, lumbar region: Secondary | ICD-10-CM | POA: Diagnosis not present

## 2017-04-25 DIAGNOSIS — Z6831 Body mass index (BMI) 31.0-31.9, adult: Secondary | ICD-10-CM | POA: Diagnosis not present

## 2017-05-24 ENCOUNTER — Other Ambulatory Visit: Payer: Self-pay | Admitting: *Deleted

## 2017-06-01 ENCOUNTER — Other Ambulatory Visit: Payer: Self-pay | Admitting: *Deleted

## 2017-06-04 ENCOUNTER — Other Ambulatory Visit: Payer: Self-pay | Admitting: *Deleted

## 2017-06-04 NOTE — Patient Outreach (Signed)
[facility]) Care Management  06/04/2017  AKSHAT MINEHART [birth year] [id number]   CSW was able to reschedule patient's outpatient appointment with [facility].  At this time he does not want to pursue seeing a counselor but wants to wait to see the Psychiatrist then.   CSW has closed case and patient is aware of how to reach CSW if needs arise.   Eduard Clos, MSW, [place] Worker  [place] [phone]

## 2017-06-15 DIAGNOSIS — Z6831 Body mass index (BMI) 31.0-31.9, adult: Secondary | ICD-10-CM | POA: Diagnosis not present

## 2017-06-15 DIAGNOSIS — J441 Chronic obstructive pulmonary disease with (acute) exacerbation: Secondary | ICD-10-CM | POA: Diagnosis not present

## 2017-06-15 DIAGNOSIS — J019 Acute sinusitis, unspecified: Secondary | ICD-10-CM | POA: Diagnosis not present

## 2017-07-03 ENCOUNTER — Ambulatory Visit (HOSPITAL_COMMUNITY): Payer: Commercial Managed Care - HMO | Admitting: Psychiatry

## 2017-07-10 ENCOUNTER — Other Ambulatory Visit: Payer: Self-pay | Admitting: *Deleted

## 2017-08-06 ENCOUNTER — Other Ambulatory Visit: Payer: Self-pay | Admitting: *Deleted

## 2017-08-07 ENCOUNTER — Ambulatory Visit (HOSPITAL_COMMUNITY): Payer: Self-pay | Admitting: Psychiatry

## 2017-09-13 DIAGNOSIS — K0889 Other specified disorders of teeth and supporting structures: Secondary | ICD-10-CM | POA: Diagnosis not present

## 2017-09-13 DIAGNOSIS — K047 Periapical abscess without sinus: Secondary | ICD-10-CM | POA: Diagnosis not present

## 2017-09-13 DIAGNOSIS — F17210 Nicotine dependence, cigarettes, uncomplicated: Secondary | ICD-10-CM | POA: Diagnosis not present

## 2017-09-15 ENCOUNTER — Other Ambulatory Visit: Payer: Self-pay

## 2017-09-15 ENCOUNTER — Ambulatory Visit (INDEPENDENT_AMBULATORY_CARE_PROVIDER_SITE_OTHER): Payer: Medicare HMO | Admitting: Psychiatry

## 2017-09-15 ENCOUNTER — Encounter (HOSPITAL_COMMUNITY): Payer: Self-pay | Admitting: Psychiatry

## 2017-09-15 VITALS — BP 132/88 | HR 88 | Ht 72.0 in | Wt 220.0 lb

## 2017-09-15 DIAGNOSIS — G4700 Insomnia, unspecified: Secondary | ICD-10-CM

## 2017-09-15 DIAGNOSIS — Z736 Limitation of activities due to disability: Secondary | ICD-10-CM | POA: Diagnosis not present

## 2017-09-15 DIAGNOSIS — F4010 Social phobia, unspecified: Secondary | ICD-10-CM

## 2017-09-15 DIAGNOSIS — Z818 Family history of other mental and behavioral disorders: Secondary | ICD-10-CM | POA: Diagnosis not present

## 2017-09-15 DIAGNOSIS — Z8673 Personal history of transient ischemic attack (TIA), and cerebral infarction without residual deficits: Secondary | ICD-10-CM | POA: Diagnosis not present

## 2017-09-15 DIAGNOSIS — Z634 Disappearance and death of family member: Secondary | ICD-10-CM

## 2017-09-15 DIAGNOSIS — F411 Generalized anxiety disorder: Secondary | ICD-10-CM | POA: Insufficient documentation

## 2017-09-15 DIAGNOSIS — F0630 Mood disorder due to known physiological condition, unspecified: Secondary | ICD-10-CM

## 2017-09-15 DIAGNOSIS — R450 Nervousness: Secondary | ICD-10-CM | POA: Diagnosis not present

## 2017-09-15 DIAGNOSIS — F17210 Nicotine dependence, cigarettes, uncomplicated: Secondary | ICD-10-CM | POA: Diagnosis not present

## 2017-09-15 DIAGNOSIS — I219 Acute myocardial infarction, unspecified: Secondary | ICD-10-CM | POA: Diagnosis not present

## 2017-09-15 HISTORY — DX: Generalized anxiety disorder: F41.1

## 2017-09-15 MED ORDER — ALPRAZOLAM 1 MG PO TABS: 1.0000 mg | ORAL_TABLET | Freq: Two times a day (BID) | ORAL | 1 refills | Status: DC

## 2017-09-15 MED ORDER — SERTRALINE HCL 50 MG PO TABS: 50.0000 mg | ORAL_TABLET | Freq: Every day | ORAL | 1 refills | Status: DC
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
## 2017-11-05 DIAGNOSIS — G8911 Acute pain due to trauma: Secondary | ICD-10-CM | POA: Diagnosis not present

## 2017-11-05 DIAGNOSIS — F17210 Nicotine dependence, cigarettes, uncomplicated: Secondary | ICD-10-CM | POA: Diagnosis not present

## 2017-11-05 DIAGNOSIS — I252 Old myocardial infarction: Secondary | ICD-10-CM | POA: Diagnosis not present

## 2017-11-05 DIAGNOSIS — Z7982 Long term (current) use of aspirin: Secondary | ICD-10-CM | POA: Diagnosis not present

## 2017-11-05 DIAGNOSIS — S098XXA Other specified injuries of head, initial encounter: Secondary | ICD-10-CM | POA: Diagnosis not present

## 2017-11-05 DIAGNOSIS — S0033XA Contusion of nose, initial encounter: Secondary | ICD-10-CM | POA: Diagnosis not present

## 2017-11-05 DIAGNOSIS — Z8673 Personal history of transient ischemic attack (TIA), and cerebral infarction without residual deficits: Secondary | ICD-10-CM | POA: Diagnosis not present

## 2017-11-05 DIAGNOSIS — I10 Essential (primary) hypertension: Secondary | ICD-10-CM | POA: Diagnosis not present

## 2017-11-05 DIAGNOSIS — S0992XA Unspecified injury of nose, initial encounter: Secondary | ICD-10-CM | POA: Diagnosis not present

## 2017-11-06 ENCOUNTER — Encounter (HOSPITAL_COMMUNITY): Payer: Self-pay | Admitting: Psychiatry

## 2017-11-06 ENCOUNTER — Ambulatory Visit (HOSPITAL_COMMUNITY): Payer: Medicare HMO | Admitting: Psychiatry

## 2017-11-06 VITALS — BP 148/82 | HR 75 | Ht 72.0 in | Wt 221.0 lb

## 2017-11-06 DIAGNOSIS — Z818 Family history of other mental and behavioral disorders: Secondary | ICD-10-CM | POA: Diagnosis not present

## 2017-11-06 DIAGNOSIS — F6381 Intermittent explosive disorder: Secondary | ICD-10-CM | POA: Diagnosis not present

## 2017-11-06 DIAGNOSIS — F411 Generalized anxiety disorder: Secondary | ICD-10-CM | POA: Diagnosis not present

## 2017-11-06 DIAGNOSIS — F17200 Nicotine dependence, unspecified, uncomplicated: Secondary | ICD-10-CM | POA: Diagnosis not present

## 2017-11-06 MED ORDER — SERTRALINE HCL 100 MG PO TABS: 100.0000 mg | ORAL_TABLET | Freq: Every day | ORAL | 0 refills | Status: DC

## 2017-11-06 MED ORDER — GUANFACINE HCL 1 MG PO TABS: 1.0000 mg | ORAL_TABLET | Freq: Every day | ORAL | 1 refills | Status: DC

## 2017-11-06 MED ORDER — GUANFACINE HCL 1 MG PO TABS: 1.0000 mg | ORAL_TABLET | Freq: Three times a day (TID) | ORAL | 1 refills | Status: DC

## 2017-11-06 NOTE — Progress Notes (Signed)
[facility] MD/PA/NP OP Progress Note  11/06/2017 4:41 PM Roy Koch  MRN:  [id number]  Chief Complaint: I do not like new things, and I do not tolerate disrespect HPI: Roy Koch presents for a transition of care appointment, I have not met this patient.  I saw that he was seen for an intake assessment, and reviewed this with him.  I reviewed the [place] controlled substance database and prior to being seen here he has not been on Xanax for over 6 years.  I discussed with him that for intermittent explosive disorder, I tend to suggest we start with an alpha modulator such as Tenex, as I have concerns about long-term use and safety for benzodiazepines.  He was quite upset at first, but I realigned with him and suggested that we increase Zoloft and give the Tenex to try to see if this helps.  Reviewed his history which is not consistent with bipolar disorder and is far more consistent with neurological injury at age 55 and subsequent development of personality and behavioral changes.   Spent time with him educating him on the risks of benzodiazepines particularly in individuals with neurological injury.  We agreed to increase Zoloft to 100 mg and initiate Tenex 1 mg 3 times daily.    Patient continued to seek benzodiazepine, Xanax.  I suggested that we follow-up in 6-8 weeks, and we will schedule a transition of his care to an alternative provider at this office.  He is welcome to discuss Xanax with his new provider if they are comfortable with it. Visit Diagnosis:    ICD-10-CM   1. Generalized anxiety disorder F41.1 sertraline (ZOLOFT) 100 MG tablet    guanFACINE (TENEX) 1 MG tablet    DISCONTINUED: guanFACINE (TENEX) 1 MG tablet  2. Intermittent explosive disorder F63.81 sertraline (ZOLOFT) 100 MG tablet    guanFACINE (TENEX) 1 MG tablet    DISCONTINUED: guanFACINE (TENEX) 1 MG tablet    Past Psychiatric History: See intake H&P for full details. Reviewed, with no updates at this  time.   Past Medical History:  Past Medical History:  Diagnosis Date  . Anxiety   . Bipolar disorder ([place])   . Cardiac abnormality   . Headache   . Stroke [facility])     Past Surgical History:  Procedure Laterality Date  . AMPUTATION FINGER / THUMB    . CARDIAC SURGERY  at 41 years old    Family Psychiatric History: See intake H&P for full details. Reviewed, with no updates at this time.   Family History:  Family History  Problem Relation Age of Onset  . Schizophrenia Father   . Bipolar disorder Maternal Uncle   . Bipolar disorder Maternal Grandmother     Social History:  Social History   Socioeconomic History  . Marital status: Divorced    Spouse name: Not on file  . Number of children: Not on file  . Years of education: Not on file  . Highest education level: Not on file  Occupational History  . Not on file  Social Needs  . Financial resource strain: Not on file  . Food insecurity:    Worry: Not on file    Inability: Not on file  . Transportation needs:    Medical: Not on file    Non-medical: Not on file  Tobacco Use  . Smoking status: Current Every Day Smoker    Packs/day: 2.00    Types: Cigarettes  . Smokeless tobacco: Never Used  Substance and  Sexual Activity  . Alcohol use: No  . Drug use: No  . Sexual activity: Not on file  Lifestyle  . Physical activity:    Days per week: Not on file    Minutes per session: Not on file  . Stress: Not on file  Relationships  . Social connections:    Talks on phone: Not on file    Gets together: Not on file    Attends religious service: Not on file    Active member of club or organization: Not on file    Attends meetings of clubs or organizations: Not on file    Relationship status: Not on file  Other Topics Concern  . Not on file  Social History Narrative  . Not on file    Allergies: No Known Allergies  Metabolic Disorder Labs: No results found for: HGBA1C, MPG No results found for: PROLACTIN No  results found for: CHOL, TRIG, HDL, CHOLHDL, VLDL, LDLCALC No results found for: TSH  Therapeutic Level Labs: No results found for: LITHIUM No results found for: VALPROATE No components found for:  CBMZ  Current Medications: Current Outpatient Medications  Medication Sig Dispense Refill  . aspirin 325 MG tablet Take by mouth.    Marland Kitchen HYDROcodone-acetaminophen (NORCO/VICODIN) 5-325 MG tablet Take by mouth.    . sertraline (ZOLOFT) 100 MG tablet Take 1 tablet (100 mg total) by mouth daily. 90 tablet 0  . guanFACINE (TENEX) 1 MG tablet Take 1 tablet (1 mg total) by mouth 3 (three) times daily. 90 tablet 1   No current facility-administered medications for this visit.      Musculoskeletal: Strength & Muscle Tone: within normal limits Gait & Station: normal Patient leans: N/A  Psychiatric Specialty Exam: ROS  Blood pressure (!) 148/82, pulse 75, height 6' (1.829 m), weight 221 lb (100.2 kg).Body mass index is 29.97 kg/m.  General Appearance: Casual and Disheveled  Eye Contact:  Fair  Speech:  Clear and Coherent and Normal Rate  Volume:  Normal  Mood:  Irritable  Affect:  Congruent and Irritable  Thought Process:  Goal Directed and Descriptions of Associations: Intact  Orientation:  Full (Time, Place, and Person)  Thought Content: Logical and Focused on Xanax   Suicidal Thoughts:  No  Homicidal Thoughts:  No  Memory:  Immediate;   Poor  Judgement:  Intact  Insight:  Shallow  Psychomotor Activity:  Normal  Concentration:  Concentration: Fair  Recall:  [employer] of Knowledge: Fair  Language: Fair  Akathisia:  Negative  Handed:  Right  AIMS (if indicated): not done  Assets:  Communication Skills Desire for Improvement  ADL's:  Intact  Cognition: WNL  Sleep:  Fair   Screenings: PHQ2-9     Patient Outreach Telephone from 03/12/2017 in [place]  PHQ-2 Total Score  2  PHQ-9 Total Score  4       Assessment and Plan:  Roy Koch is a 42 year old  male with anxiety and intermittent explosive disorder in the context of neurological injury at age 28.  His wife is present today and able to corroborate that his personality changed significantly after the stroke at age 63, and he went from being generally happy to becoming more irritable, angry, and struggling with episodic anger.  He has been taking Xanax for the past 8 weeks, approximately twice a day.  Before that he has not been on Xanax for over 6-7 years.  He admits that he has been trying to find a  provider over the past few years who would be willing to prescribe Xanax and has seen multiple providers in the community and been turned away for this.   I am concerned about benzodiazepine seeking, and I spent time with the patient educating him on why benzodiazepines are considered to be a last resort after exhausting other options.  He was fairly irritable with Probation officer, but responded to redirection.  We agreed to increase Zoloft to 100 mg, and initiate Tenex 1 mg 3 times a day.  He drinks alcohol about 1-2 times per month but does not use any other substances.  We will follow-up in 6 weeks and he will continue in transition of care to Dr. Casimiro Needle thereafter.  I do believe that the patient would benefit from a dose increase of Zoloft to 200 mg and we can consider that change at his follow-up.  1. Generalized anxiety disorder   2. Intermittent explosive disorder     Status of current problems: new to [employer] Ordered: No orders of the defined types were placed in this encounter.   Labs Reviewed: na  Collateral Obtained/Records Reviewed: Wife is present and able to corroborate episodic agitation and explosive behaviors when he is angry  Plan:  No acute safety issues at this time Discontinue Xanax Initiate Tenex 1 mg 3 times daily Increase Zoloft to 100 mg rtc 6 weeks  Aundra Dubin, MD 11/06/2017, 4:41 PM

## 2017-11-06 NOTE — Patient Instructions (Signed)
START tenex 1 mg 3 times a day  Increase zoloft to 100 mg daily

## 2017-11-09 ENCOUNTER — Other Ambulatory Visit (HOSPITAL_COMMUNITY): Payer: Self-pay | Admitting: Psychiatry

## 2017-11-28 DIAGNOSIS — H52209 Unspecified astigmatism, unspecified eye: Secondary | ICD-10-CM | POA: Diagnosis not present

## 2017-11-28 DIAGNOSIS — H52229 Regular astigmatism, unspecified eye: Secondary | ICD-10-CM | POA: Diagnosis not present

## 2017-11-28 DIAGNOSIS — H5213 Myopia, bilateral: Secondary | ICD-10-CM | POA: Diagnosis not present

## 2017-12-20 DIAGNOSIS — J441 Chronic obstructive pulmonary disease with (acute) exacerbation: Secondary | ICD-10-CM | POA: Diagnosis not present

## 2017-12-20 DIAGNOSIS — J019 Acute sinusitis, unspecified: Secondary | ICD-10-CM | POA: Diagnosis not present

## 2017-12-20 DIAGNOSIS — Z6830 Body mass index (BMI) 30.0-30.9, adult: Secondary | ICD-10-CM | POA: Diagnosis not present

## 2018-01-09 ENCOUNTER — Ambulatory Visit (HOSPITAL_COMMUNITY): Payer: Medicare HMO | Admitting: Psychiatry

## 2018-01-09 ENCOUNTER — Encounter (HOSPITAL_COMMUNITY): Payer: Self-pay | Admitting: Psychiatry

## 2018-01-09 VITALS — BP 148/92 | HR 80 | Ht 72.0 in | Wt 222.2 lb

## 2018-01-09 DIAGNOSIS — Z818 Family history of other mental and behavioral disorders: Secondary | ICD-10-CM

## 2018-01-09 DIAGNOSIS — F411 Generalized anxiety disorder: Secondary | ICD-10-CM | POA: Diagnosis not present

## 2018-01-09 DIAGNOSIS — F6381 Intermittent explosive disorder: Secondary | ICD-10-CM | POA: Diagnosis not present

## 2018-01-09 DIAGNOSIS — F0630 Mood disorder due to known physiological condition, unspecified: Secondary | ICD-10-CM | POA: Diagnosis not present

## 2018-01-09 DIAGNOSIS — F17210 Nicotine dependence, cigarettes, uncomplicated: Secondary | ICD-10-CM

## 2018-01-09 MED ORDER — GUANFACINE HCL 1 MG PO TABS: 1.0000 mg | ORAL_TABLET | Freq: Three times a day (TID) | ORAL | 0 refills | Status: DC

## 2018-01-09 MED ORDER — CITALOPRAM HYDROBROMIDE 20 MG PO TABS: 20.0000 mg | ORAL_TABLET | Freq: Every day | ORAL | 0 refills | Status: DC

## 2018-01-09 NOTE — Progress Notes (Signed)
[facility] MD/PA/NP OP Progress Note  01/09/2018 4:22 PM Roy Koch  MRN:  [id number]  Chief Complaint: A little better  HPI: Roy Koch reports that the Tenex actually does help him feel a little bit more calm and in control of his anger.  He reports that the Zoloft also helps but it has been pretty hard on his stomach.  We agreed to switch him to Celexa given the reduced side effect profile and he was receptive to this.  He has only been taking Tenex once a day at night, and we agreed to increase to 3 times a day.  He was confused because the pharmacy told him he is only supposed to take it at night.  I reiterated the regimen of Celexa once a day in the morning and Tenex 3 times a day and he was receptive to following up with Dr. Casimiro Needle in this office in 3 months or sooner if needed.  No acute safety concerns or suicidality, and his wife is present and able to corroborate that he has been more calm on the regimen.  He continues to be irritated by things that are outside of his control but he realizes that may be his anger is really just sadness that he cannot control certain things about his life and his brain.  Visit Diagnosis:    ICD-10-CM   1. Mood disorder in conditions classified elsewhere F06.30 Ambulatory referral to Psychology  2. Generalized anxiety disorder F41.1 guanFACINE (TENEX) 1 MG tablet    Ambulatory referral to Psychology  3. Intermittent explosive disorder F63.81 guanFACINE (TENEX) 1 MG tablet    Past Psychiatric History: See intake H&P for full details. Reviewed, with no updates at this time.   Past Medical History:  Past Medical History:  Diagnosis Date  . Anxiety   . Bipolar disorder ([place])   . Cardiac abnormality   . Headache   . Stroke [facility])     Past Surgical History:  Procedure Laterality Date  . AMPUTATION FINGER / THUMB    . CARDIAC SURGERY  at 42 years old    Family Psychiatric History: See intake H&P for full details. Reviewed, with no updates at  this time.   Family History:  Family History  Problem Relation Age of Onset  . Schizophrenia Father   . Bipolar disorder Maternal Uncle   . Bipolar disorder Maternal Grandmother     Social History:  Social History   Socioeconomic History  . Marital status: Divorced    Spouse name: Not on file  . Number of children: Not on file  . Years of education: Not on file  . Highest education level: Not on file  Occupational History  . Not on file  Social Needs  . Financial resource strain: Not on file  . Food insecurity:    Worry: Not on file    Inability: Not on file  . Transportation needs:    Medical: Not on file    Non-medical: Not on file  Tobacco Use  . Smoking status: Current Every Day Smoker    Packs/day: 2.00    Types: Cigarettes  . Smokeless tobacco: Never Used  Substance and Sexual Activity  . Alcohol use: No  . Drug use: No  . Sexual activity: Not on file  Lifestyle  . Physical activity:    Days per week: Not on file    Minutes per session: Not on file  . Stress: Not on file  Relationships  . Social connections:  Talks on phone: Not on file    Gets together: Not on file    Attends religious service: Not on file    Active member of club or organization: Not on file    Attends meetings of clubs or organizations: Not on file    Relationship status: Not on file  Other Topics Concern  . Not on file  Social History Narrative  . Not on file    Allergies: No Known Allergies  Metabolic Disorder Labs: No results found for: HGBA1C, MPG No results found for: PROLACTIN No results found for: CHOL, TRIG, HDL, CHOLHDL, VLDL, LDLCALC No results found for: TSH  Therapeutic Level Labs: No results found for: LITHIUM No results found for: VALPROATE No components found for:  CBMZ  Current Medications: Current Outpatient Medications  Medication Sig Dispense Refill  . aspirin 325 MG tablet Take by mouth.    . guanFACINE (TENEX) 1 MG tablet Take 1 tablet (1 mg  total) by mouth 3 (three) times daily. 270 tablet 0  . citalopram (CELEXA) 20 MG tablet Take 1 tablet (20 mg total) by mouth daily. 90 tablet 0   No current facility-administered medications for this visit.     Musculoskeletal: Strength & Muscle Tone: within normal limits Gait & Station: normal Patient leans: N/A  Psychiatric Specialty Exam: ROS  Blood pressure (!) 148/92, pulse 80, height 6' (1.829 m), weight 222 lb 3.2 oz (100.8 kg).Body mass index is 30.14 kg/m.  General Appearance: Casual and Disheveled  Eye Contact:  Fair  Speech:  Clear and Coherent and Normal Rate  Volume:  Normal  Mood:  Less irritable  Affect:  Appropriate and Congruent  Thought Process:  Goal Directed and Descriptions of Associations: Intact  Orientation:  Full (Time, Place, and Person)  Thought Content: Logical and Focused on Xanax   Suicidal Thoughts:  No  Homicidal Thoughts:  No  Memory:  Immediate;   Poor  Judgement:  Fair  Insight:  Present, Shallow and Improving  Psychomotor Activity:  Normal  Concentration:  Concentration: Fair  Recall:  [employer] of Knowledge: Fair  Language: Fair  Akathisia:  Negative  Handed:  Right  AIMS (if indicated): not done  Assets:  Communication Skills Desire for Improvement  ADL's:  Intact  Cognition: WNL  Sleep:  Fair   Screenings: PHQ2-9     Patient Outreach Telephone from 03/12/2017 in [place]  PHQ-2 Total Score  2  PHQ-9 Total Score  4       Assessment and Plan:  Roy Koch is a 42 year old male with anxiety and intermittent explosive disorder in the context of neurological injury at age 34.  He seems to be having a good response to SSRI and alpha modulating agent, Tenex.  He is agreeable to follow-up with Dr. Casimiro Needle in 3 months for routine medication management and also receptive to an individual therapy referral.  1. Mood disorder in conditions classified elsewhere   2. Generalized anxiety disorder   3. Intermittent  explosive disorder     Status of current problems: new to [employer] Ordered: Orders Placed This Encounter  Procedures  . Ambulatory referral to Psychology    Referral Priority:   Routine    Referral Type:   Psychiatric    Referral Reason:   Specialty Services Required    Requested Specialty:   Psychology    Number of Visits Requested:   1    Labs Reviewed: na  Collateral Obtained/Records Reviewed: Wife is  present and able to corroborate episodic agitation and explosive behaviors when he is angry  Plan:  Tenex 1 mg 3 times daily Celexa 20 mg daily  Roy Dubin, MD 01/09/2018, 4:22 PM

## 2018-03-13 ENCOUNTER — Ambulatory Visit (HOSPITAL_COMMUNITY): Payer: Self-pay | Admitting: Psychiatry

## 2018-03-20 DIAGNOSIS — J449 Chronic obstructive pulmonary disease, unspecified: Secondary | ICD-10-CM | POA: Diagnosis not present

## 2018-03-20 DIAGNOSIS — Z720 Tobacco use: Secondary | ICD-10-CM | POA: Diagnosis not present

## 2018-03-20 DIAGNOSIS — J019 Acute sinusitis, unspecified: Secondary | ICD-10-CM | POA: Diagnosis not present

## 2018-04-18 ENCOUNTER — Encounter (HOSPITAL_COMMUNITY): Payer: Self-pay | Admitting: Psychiatry

## 2018-04-18 ENCOUNTER — Ambulatory Visit (INDEPENDENT_AMBULATORY_CARE_PROVIDER_SITE_OTHER): Payer: Medicare HMO | Admitting: Psychiatry

## 2018-04-18 VITALS — BP 168/99 | HR 62 | Ht 72.0 in | Wt 226.0 lb

## 2018-04-18 DIAGNOSIS — F6381 Intermittent explosive disorder: Secondary | ICD-10-CM

## 2018-04-18 MED ORDER — CITALOPRAM HYDROBROMIDE 20 MG PO TABS: 20.0000 mg | ORAL_TABLET | Freq: Every day | ORAL | 2 refills | Status: DC

## 2018-04-18 MED ORDER — CITALOPRAM HYDROBROMIDE 20 MG PO TABS: 20.0000 mg | ORAL_TABLET | Freq: Every day | ORAL | 1 refills | Status: DC

## 2018-04-18 NOTE — Progress Notes (Signed)
[facility] MD/PA/NP OP Progress Note  04/18/2018 2:45 PM Roy Koch  MRN:  [id number]  Chief Complaint: A little better  HPI: This patient is a 42 year old white male who is divorced and is being treated here for an anxiety disorder.  In reality the patient really does not have an anxiety disorder but it is true at night.  He has intermittent explosive disorder.  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  He is at home and [place].  Unfortunately his mother died in November 19, 2022 suddenly as a complication of her hip surgery.  Presently the patient lives with his grandmother plans to return to his home in [place].  At this time the patient denies daily depression.  He is sleeping and eating well.  He is got good energy.  He claims he can think and concentrate quite well.  He lost patient does not.  He is not suicidal now and never has been.  The patient does admit that is a problem with his temper but it is gotten better.  He says that when he goes off he can control himself.  He says it occurs much less often at this time.  He does not know exactly what brings it on.  He does not necessarily have remorse when he gets aggressive.  In fact he says he does not care about anyone around him.  Just does not let anybody bother him.  He drinks no alcohol uses no drugs.  He is never been psychotic.  He denies generalized anxiety disorder symptoms he is never had panic attacks.  Patient is never been in a psychiatric hospital.  The patient works at a [employer] station a few days a week.  Patient is on disability.  Patient has a very isolated sedentary job.  He can tell he does not like to be around a lot of people.  He acknowledges that he is here easy to irritate.  Patient believes that Celexa has been helpful.  Patient has had a significant medical history.  He includes 2 strokes and heart attack.  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  He is not significantly physically limited in  terms of his motor or sensory functioning.  I suspect he has issues with irritability impulsivity related from brain injury.  At this time he seems generally stable.  He tells me today that he is in a good mood.  Visit Diagnosis:     No diagnosis found.  Past Psychiatric History: See intake H&P for full details. Reviewed, with no updates at this time.   Past Medical History:  Past Medical History:  Diagnosis Date  . Anxiety   . Bipolar disorder ([place])   . Cardiac abnormality   . Headache   . Stroke [facility])     Past Surgical History:  Procedure Laterality Date  . AMPUTATION FINGER / THUMB    . CARDIAC SURGERY  at 42 years old    Family Psychiatric History: See intake H&P for full details. Reviewed, with no updates at this time.   Family History:  Family History  Problem Relation Age of Onset  . Schizophrenia Father   . Bipolar disorder Maternal Uncle   . Bipolar disorder Maternal Grandmother     Social History:  Social History   Socioeconomic History  . Marital status: Divorced    Spouse name: Not on file  . Number of children: 2  . Years of education: Not on file  .  Highest education level: 11th grade  Occupational History  . Not on file  Social Needs  . Financial resource strain: Somewhat hard  . Food insecurity:    Worry: Never true    Inability: Never true  . Transportation needs:    Medical: No    Non-medical: Not on file  Tobacco Use  . Smoking status: Current Every Day Smoker    Packs/day: 2.00    Types: Cigarettes  . Smokeless tobacco: Never Used  Substance and Sexual Activity  . Alcohol use: No  . Drug use: No  . Sexual activity: Not on file  Lifestyle  . Physical activity:    Days per week: 7 days    Minutes per session: 20 min  . Stress: To some extent  Relationships  . Social connections:    Talks on phone: More than three times a week    Gets together: More than three times a week    Attends religious service: Never    Active member  of club or organization: No    Attends meetings of clubs or organizations: Never    Relationship status: Divorced  Other Topics Concern  . Not on file  Social History Narrative  . Not on file    Allergies: No Known Allergies  Metabolic Disorder Labs: No results found for: HGBA1C, MPG No results found for: PROLACTIN No results found for: CHOL, TRIG, HDL, CHOLHDL, VLDL, LDLCALC No results found for: TSH  Therapeutic Level Labs: No results found for: LITHIUM No results found for: VALPROATE No components found for:  CBMZ  Current Medications: Current Outpatient Medications  Medication Sig Dispense Refill  . aspirin 325 MG tablet Take by mouth.    . citalopram (CELEXA) 20 MG tablet Take 1 tablet (20 mg total) by mouth daily. 90 tablet 1  . guanFACINE (TENEX) 1 MG tablet Take 1 tablet (1 mg total) by mouth 3 (three) times daily. 270 tablet 0   No current facility-administered medications for this visit.     Musculoskeletal: Strength & Muscle Tone: within normal limits Gait & Station: normal Patient leans: N/A  Psychiatric Specialty Exam: ROS  Blood pressure (!) 168/99, pulse 62, height 6' (1.829 m), weight 226 lb (102.5 kg), SpO2 96 %.Body mass index is 30.65 kg/m.  General Appearance: Casual and Disheveled  Eye Contact:  Fair  Speech:  Clear and Coherent and Normal Rate  Volume:  Normal  Mood:  Less irritable  Affect:  Appropriate and Congruent  Thought Process:  Goal Directed and Descriptions of Associations: Intact  Orientation:  Full (Time, Place, and Person)  Thought Content: Logical and Focused on Xanax   Suicidal Thoughts:  No  Homicidal Thoughts:  No  Memory:  Immediate;   Poor  Judgement:  Fair  Insight:  Present, Shallow and Improving  Psychomotor Activity:  Normal  Concentration:  Concentration: Fair  Recall:  [employer] of Knowledge: Fair  Language: Fair  Akathisia:  Negative  Handed:  Right  AIMS (if indicated): not done  Assets:  Communication  Skills Desire for Improvement  ADL's:  Intact  Cognition: WNL  Sleep:  Fair   Screenings: PHQ2-9     Patient Outreach Telephone from 03/12/2017 in [place]  PHQ-2 Total Score  2  PHQ-9 Total Score  4       Assessment and Plan:  At this time the patient will continue taking Celexa 20 mg.  Generally he seems to be relatively stable.  He is happy with  life.  He wants nothing new.  He stays active has energy continues to work.  At this time he denies chest pain shortness of breath or any neurological symptoms.  This patient she will be reevaluated in 3 months.  I do not think of him is being fatigued.  Generally symptomatology would be considered to be minimal.  I think he represents an individual whose brain major and is little more limited capacity to regulate his anger.  At this time fortunately it seems to be checked by taking Celexa.  He also has developed a lifestyle where he can work a small amount time and get those problem where he likes to be alone.  I do not believe this patient is dangerous to himself or anyone else.  He actually has some insight that he knows he can get irritable and explosive and he does everything he can to avoid those settings.  He shared with me that he has ability to walk away from things and to ignore those that are bothering him.  He is not homicidal nor she comes suicidal. No diagnosis found.  Status of current problems: new to [employer] Ordered: No orders of the defined types were placed in this encounter.   Labs Reviewed: na  Collateral Obtained/Records Reviewed: Wife is present and able to corroborate episodic agitation and explosive behaviors when he is angry  Plan:  Tenex 1 mg 3 times daily Celexa 20 mg daily  Jerral Ralph, MD 04/18/2018, 2:45 PM

## 2018-05-10 ENCOUNTER — Other Ambulatory Visit (HOSPITAL_COMMUNITY): Payer: Self-pay

## 2018-05-10 DIAGNOSIS — F411 Generalized anxiety disorder: Secondary | ICD-10-CM

## 2018-05-10 DIAGNOSIS — F6381 Intermittent explosive disorder: Secondary | ICD-10-CM

## 2018-05-10 MED ORDER — GUANFACINE HCL 1 MG PO TABS: 1.0000 mg | ORAL_TABLET | Freq: Three times a day (TID) | ORAL | 0 refills | Status: DC

## 2018-06-10 DIAGNOSIS — J019 Acute sinusitis, unspecified: Secondary | ICD-10-CM | POA: Diagnosis not present

## 2018-06-10 DIAGNOSIS — M542 Cervicalgia: Secondary | ICD-10-CM | POA: Diagnosis not present

## 2018-06-10 DIAGNOSIS — Z6832 Body mass index (BMI) 32.0-32.9, adult: Secondary | ICD-10-CM | POA: Diagnosis not present

## 2018-06-10 DIAGNOSIS — R002 Palpitations: Secondary | ICD-10-CM | POA: Diagnosis not present

## 2018-06-10 DIAGNOSIS — G43909 Migraine, unspecified, not intractable, without status migrainosus: Secondary | ICD-10-CM | POA: Diagnosis not present

## 2018-06-18 ENCOUNTER — Ambulatory Visit: Payer: Medicare HMO | Admitting: Cardiology

## 2018-06-18 ENCOUNTER — Encounter: Payer: Self-pay | Admitting: Cardiology

## 2018-06-18 VITALS — BP 160/100 | HR 75 | Ht 72.0 in | Wt 226.0 lb

## 2018-06-18 DIAGNOSIS — F17210 Nicotine dependence, cigarettes, uncomplicated: Secondary | ICD-10-CM

## 2018-06-18 DIAGNOSIS — Z8774 Personal history of (corrected) congenital malformations of heart and circulatory system: Secondary | ICD-10-CM

## 2018-06-18 DIAGNOSIS — I10 Essential (primary) hypertension: Secondary | ICD-10-CM

## 2018-06-18 DIAGNOSIS — R0789 Other chest pain: Secondary | ICD-10-CM

## 2018-06-18 HISTORY — DX: Essential (primary) hypertension: I10

## 2018-06-18 HISTORY — DX: Personal history of (corrected) congenital malformations of heart and circulatory system: Z87.74

## 2018-06-18 HISTORY — DX: Other chest pain: R07.89

## 2018-06-18 HISTORY — DX: Nicotine dependence, cigarettes, uncomplicated: F17.210

## 2018-06-18 MED ORDER — LISINOPRIL 20 MG PO TABS: 20.0000 mg | ORAL_TABLET | Freq: Every day | ORAL | 1 refills | Status: DC

## 2018-06-18 NOTE — Patient Instructions (Signed)
Medication Instructions:  Your physician has recommended you make the following change in your medication:   Start: Lisinopril 20 mg daily    If you need a refill on your cardiac medications before your next appointment, please call your pharmacy.   Lab work: Your physician recommends that you return for lab work in 1 week (when you come for Roy Koch): bmp  If you have labs (blood work) drawn today and your tests are completely normal, you will receive your results only by: Marland Kitchen MyChart Message (if you have MyChart) OR . A paper copy in the mail If you have any lab test that is abnormal or we need to change your treatment, we will call you to review the results.  Testing/Procedures: Your physician has requested that you have an echocardiogram. Echocardiography is a painless test that uses sound waves to create images of your heart. It provides your doctor with information about the size and shape of your heart and how well your heart's chambers and valves are working. This procedure takes approximately one hour. There are no restrictions for this procedure.  Your physician has requested that you have a lexiscan myoview. For further information please visit [email or web address]. Please follow instruction sheet, as given.    Follow-Up: At [facility], you and your health needs are our priority.  As part of our continuing mission to provide you with exceptional heart care, we have created designated Provider Care Teams.  These Care Teams include your primary Cardiologist (physician) and Advanced Practice Providers (APPs -  Physician Assistants and Nurse Practitioners) who all work together to provide you with the care you need, when you need it. You will need a follow up appointment in 6 months.  Please call our office 2 months in advance to schedule this appointment.  You may see No primary care provider on file. or another member of our [employer] in [place]: Jenne Campus, MD . Shirlee More, MD  Any Other Special Instructions Will Be Listed Below (If Applicable).   Echocardiogram An echocardiogram is a procedure that uses painless sound waves (ultrasound) to produce an image of the heart. Images from an echocardiogram can provide important information about:  Signs of coronary artery disease (CAD).  Aneurysm detection. An aneurysm is a weak or damaged part of an artery wall that bulges out from the normal force of blood pumping through the body.  Heart size and shape. Changes in the size or shape of the heart can be associated with certain conditions, including heart failure, aneurysm, and CAD.  Heart muscle function.  Heart valve function.  Signs of a past heart attack.  Fluid buildup around the heart.  Thickening of the heart muscle.  A tumor or infectious growth around the heart valves. Tell a health care provider about:  Any allergies you have.  All medicines you are taking, including vitamins, herbs, eye drops, creams, and over-the-counter medicines.  Any blood disorders you have.  Any surgeries you have had.  Any medical conditions you have.  Whether you are pregnant or may be pregnant. What are the risks? Generally, this is a safe procedure. However, problems may occur, including:  Allergic reaction to dye (contrast) that may be used during the procedure. What happens before the procedure? No specific preparation is needed. You may eat and drink normally. What happens during the procedure?   An IV tube may be inserted into one of your veins.  You may receive contrast through this tube. A  contrast is an injection that improves the quality of the pictures from your heart.  A gel will be applied to your chest.  A wand-like tool (transducer) will be moved over your chest. The gel will help to transmit the sound waves from the transducer.  The sound waves will harmlessly bounce off of your heart to allow the heart  images to be captured in real-time motion. The images will be recorded on a computer. The procedure may vary among health care providers and hospitals. What happens after the procedure?  You may return to your normal, everyday life, including diet, activities, and medicines, unless your health care provider tells you not to do that. Summary  An echocardiogram is a procedure that uses painless sound waves (ultrasound) to produce an image of the heart.  Images from an echocardiogram can provide important information about the size and shape of your heart, heart muscle function, heart valve function, and fluid buildup around your heart.  You do not need to do anything to prepare before this procedure. You may eat and drink normally.  After the echocardiogram is completed, you may return to your normal, everyday life, unless your health care provider tells you not to do that. This information is not intended to replace advice given to you by your health care provider. Make sure you discuss any questions you have with your health care provider. Document Released: 05/12/2000 Document Revised: 06/17/2016 Document Reviewed: 06/17/2016 Elsevier Interactive Patient Education  2019 Elsevier Inc.   Lisinopril tablets What is this medicine? LISINOPRIL (lyse IN oh pril) is an ACE inhibitor. This medicine is used to treat high blood pressure and heart failure. It is also used to protect the heart immediately after a heart attack. This medicine may be used for other purposes; ask your health care provider or pharmacist if you have questions. COMMON BRAND NAME(S): Prinivil, Zestril What should I tell my health care provider before I take this medicine? They need to know if you have any of these conditions: -diabetes -heart or blood vessel disease -kidney disease -low blood pressure -previous swelling of the tongue, face, or lips with difficulty breathing, difficulty swallowing, hoarseness, or  tightening of the throat -an unusual or allergic reaction to lisinopril, other ACE inhibitors, insect venom, foods, dyes, or preservatives -pregnant or trying to get pregnant -breast-feeding How should I use this medicine? Take this medicine by mouth with a glass of water. Follow the directions on your prescription label. You may take this medicine with or without food. If it upsets your stomach, take it with food. Take your medicine at regular intervals. Do not take it more often than directed. Do not stop taking except on your doctor's advice. Talk to your pediatrician regarding the use of this medicine in children. Special care may be needed. While this drug may be prescribed for children as young as 20 years of age for selected conditions, precautions do apply. Overdosage: If you think you have taken too much of this medicine contact a poison control center or emergency room at once. NOTE: This medicine is only for you. Do not share this medicine with others. What if I miss a dose? If you miss a dose, take it as soon as you can. If it is almost time for your next dose, take only that dose. Do not take double or extra doses. What may interact with this medicine? Do not take this medicine with any of the following medications: -hymenoptera venom -sacubitril; valsartan This medicines may  also interact with the following medications: -aliskiren -angiotensin receptor blockers, like losartan or valsartan -certain medicines for diabetes -diuretics -everolimus -gold compounds -lithium -NSAIDs, medicines for pain and inflammation, like ibuprofen or naproxen -potassium salts or supplements -salt substitutes -sirolimus -temsirolimus This list may not describe all possible interactions. Give your health care provider a list of all the medicines, herbs, non-prescription drugs, or dietary supplements you use. Also tell them if you smoke, drink alcohol, or use illegal drugs. Some items may interact  with your medicine. What should I watch for while using this medicine? Visit your doctor or health care professional for regular check ups. Check your blood pressure as directed. Ask your doctor what your blood pressure should be, and when you should contact him or her. Do not treat yourself for coughs, colds, or pain while you are using this medicine without asking your doctor or health care professional for advice. Some ingredients may increase your blood pressure. Women should inform their doctor if they wish to become pregnant or think they might be pregnant. There is a potential for serious side effects to an unborn child. Talk to your health care professional or pharmacist for more information. Check with your doctor or health care professional if you get an attack of severe diarrhea, nausea and vomiting, or if you sweat a lot. The loss of too much body fluid can make it dangerous for you to take this medicine. You may get drowsy or dizzy. Do not drive, use machinery, or do anything that needs mental alertness until you know how this drug affects you. Do not stand or sit up quickly, especially if you are an older patient. This reduces the risk of dizzy or fainting spells. Alcohol can make you more drowsy and dizzy. Avoid alcoholic drinks. Avoid salt substitutes unless you are told otherwise by your doctor or health care professional. What side effects may I notice from receiving this medicine? Side effects that you should report to your doctor or health care professional as soon as possible: -allergic reactions like skin rash, itching or hives, swelling of the hands, feet, face, lips, throat, or tongue -breathing problems -signs and symptoms of kidney injury like trouble passing urine or change in the amount of urine -signs and symptoms of increased potassium like muscle weakness; chest pain; or fast, irregular heartbeat -signs and symptoms of liver injury like dark yellow or Heroux urine; general  ill feeling or flu-like symptoms; light-colored stools; loss of appetite; nausea; right upper belly pain; unusually weak or tired; yellowing of the eyes or skin -signs and symptoms of low blood pressure like dizziness; feeling faint or lightheaded, falls; unusually weak or tired -stomach pain with or without nausea and vomiting Side effects that usually do not require medical attention (report to your doctor or health care professional if they continue or are bothersome): -changes in taste -cough -dizziness -fever -headache -sensitivity to light This list may not describe all possible side effects. Call your doctor for medical advice about side effects. You may report side effects to FDA at 1-800-FDA-1088. Where should I keep my medicine? Keep out of the reach of children. Store at room temperature between 15 and 30 degrees C (59 and 86 degrees F). Protect from moisture. Keep container tightly closed. Throw away any unused medicine after the expiration date. NOTE: This sheet is a summary. It may not cover all possible information. If you have questions about this medicine, talk to your doctor, pharmacist, or health care provider.  2019 Elsevier/Gold  Standard (2015-07-05 12:52:35)   Cardiac Nuclear Scan A cardiac nuclear scan is a test that measures blood flow to the heart when a person is resting and when he or she is exercising. The test looks for problems such as:  Not enough blood reaching a portion of the heart.  The heart muscle not working normally. You may need this test if:  You have heart disease.  You have had abnormal lab results.  You have had heart surgery or a balloon procedure to open up blocked arteries (angioplasty).  You have chest pain.  You have shortness of breath. In this test, a radioactive dye (tracer) is injected into your bloodstream. After the tracer has traveled to your heart, an imaging device is used to measure how much of the tracer is absorbed by or  distributed to various areas of your heart. This procedure is usually done at a hospital and takes 2-4 hours. Tell a health care provider about:  Any allergies you have.  All medicines you are taking, including vitamins, herbs, eye drops, creams, and over-the-counter medicines.  Any problems you or family members have had with anesthetic medicines.  Any blood disorders you have.  Any surgeries you have had.  Any medical conditions you have.  Whether you are pregnant or may be pregnant. What are the risks? Generally, this is a safe procedure. However, problems may occur, including:  Serious chest pain and heart attack. This is only a risk if the stress portion of the test is done.  Rapid heartbeat.  Sensation of warmth in your chest. This usually passes quickly.  Allergic reaction to the tracer. What happens before the procedure?  Ask your health care provider about changing or stopping your regular medicines. This is especially important if you are taking diabetes medicines or blood thinners.  Follow instructions from your health care provider about eating or drinking restrictions.  Remove your jewelry on the day of the procedure. What happens during the procedure?  An IV will be inserted into one of your veins.  Your health care provider will inject a small amount of radioactive tracer through the IV.  You will wait for 20-40 minutes while the tracer travels through your bloodstream.  Your heart activity will be monitored with an electrocardiogram (ECG).  You will lie down on an exam table.  Images of your heart will be taken for about 15-20 minutes.  You may also have a stress test. For this test, one of the following may be done: ? You will exercise on a treadmill or stationary bike. While you exercise, your heart's activity will be monitored with an ECG, and your blood pressure will be checked. ? You will be given medicines that will increase blood flow to parts  of your heart. This is done if you are unable to exercise.  When blood flow to your heart has peaked, a tracer will again be injected through the IV.  After 20-40 minutes, you will get back on the exam table and have more images taken of your heart.  Depending on the type of tracer used, scans may need to be repeated 3-4 hours later.  Your IV line will be removed when the procedure is over. The procedure may vary among health care providers and hospitals. What happens after the procedure?  Unless your health care provider tells you otherwise, you may return to your normal schedule, including diet, activities, and medicines.  Unless your health care provider tells you otherwise, you may increase  your fluid intake. This will help to flush the contrast dye from your body. Drink enough fluid to keep your urine pale yellow.  Ask your health care provider, or the department that is doing the test: ? When will my results be ready? ? How will I get my results? Summary  A cardiac nuclear scan measures the blood flow to the heart when a person is resting and when he or she is exercising.  Tell your health care provider if you are pregnant.  Before the procedure, ask your health care provider about changing or stopping your regular medicines. This is especially important if you are taking diabetes medicines or blood thinners.  After the procedure, unless your health care provider tells you otherwise, increase your fluid intake. This will help flush the contrast dye from your body.  After the procedure, unless your health care provider tells you otherwise, you may return to your normal schedule, including diet, activities, and medicines. This information is not intended to replace advice given to you by your health care provider. Make sure you discuss any questions you have with your health care provider. Document Released: 06/09/2004 Document Revised: 10/29/2017 Document Reviewed:  10/29/2017 Elsevier Interactive Patient Education  2019 [employer].

## 2018-06-18 NOTE — Progress Notes (Signed)
Cardiology Office Note:    Date:  06/18/2018   ID:  Roy Koch, Roy Koch [birth year], MRN [id number]  PCP:  Roy Bender, PA-C  Cardiologist:  Roy Lindau, MD   Referring MD: Roy Bender, PA-C    ASSESSMENT:    1. Chest discomfort   2. Essential hypertension   3. Cigarette smoker   4. History of repair of congenital atrial septal defect (ASD)    PLAN:    In order of problems listed above:  1. In view of the above following recommendations were made to the patient. 2. His blood pressure is elevated and he and his fiance agree that is elevated at home also.  It is very similar at home and I have initiated him on lisinopril 20 mg daily.  He will keep a track of his blood pressures and we will do blood work in the next time when he comes for a stress test. 3. In view of his chest pain symptoms I have recommended a Lexiscan sestamibi. 4. Echocardiogram will be done to assess murmur heard on auscultation and also will help me assess his congenital heart disease. 5. I spent 5 minutes with the patient discussing solely about smoking. Smoking cessation was counseled. I suggested to the patient also different medications and pharmacological interventions. Patient is keen to try stopping on its own at this time. He will get back to me if he needs any further assistance in this matter. 6. Patient will be seen in follow-up appointment in 6 months or earlier if the patient has any concerns    Medication Adjustments/Labs and Tests Ordered: Current medicines are reviewed at length with the patient today.  Concerns regarding medicines are outlined above.  No orders of the defined types were placed in this encounter.  No orders of the defined types were placed in this encounter.    History of Present Illness:    Roy Koch is a 43 y.o. male who is being seen today for the evaluation of chest discomfort at the request of Roy Koch, Roy Koch.  Patient is a pleasant 43 year old male.   He is accompanied by his Roy Koch.  He has past medical history of essential hypertension and post ASD repair.  He gives history of ventricular septal defect.  He mentions to me that occasionally he will have chest tightness.  This is not occurring on exertion.  No radiation of the symptoms to the neck or to the arms.  At the time of my evaluation, the patient is alert awake oriented and in no distress.  The patient mentions to me that sexual activity does not bring about his chest pain.  He is a heavy smoker and smokes since young age.  Past Medical History:  Diagnosis Date  . Anxiety   . Bipolar disorder ([place])   . Cardiac abnormality   . Headache   . Stroke [facility])     Past Surgical History:  Procedure Laterality Date  . AMPUTATION FINGER / THUMB    . CARDIAC SURGERY  at 43 years old    Current Medications: Current Meds  Medication Sig  . albuterol (PROVENTIL HFA;VENTOLIN HFA) 108 (90 Base) MCG/ACT inhaler Inhale 1-2 puffs into the lungs daily as needed.  Marland Kitchen aspirin 325 MG tablet Take 325 mg by mouth daily.   . Butalbital-APAP-Caffeine 50-325-40 MG capsule Take 1 capsule by mouth daily as needed.  . doxycycline (VIBRA-TABS) 100 MG tablet Take 100 mg by mouth 2 (two) times daily.  Allergies:   Patient has no known allergies.   Social History   Socioeconomic History  . Marital status: Divorced    Spouse name: Not on file  . Number of children: 2  . Years of education: Not on file  . Highest education level: 11th grade  Occupational History  . Not on file  Social Needs  . Financial resource strain: Somewhat hard  . Food insecurity:    Worry: Never true    Inability: Never true  . Transportation needs:    Medical: No    Non-medical: Not on file  Tobacco Use  . Smoking status: Current Every Day Smoker    Packs/day: 2.00    Types: Cigarettes  . Smokeless tobacco: Never Used  Substance and Sexual Activity  . Alcohol use: No  . Drug use: No  . Sexual activity: Not on file    Lifestyle  . Physical activity:    Days per week: 7 days    Minutes per session: 20 min  . Stress: To some extent  Relationships  . Social connections:    Talks on phone: More than three times a week    Gets together: More than three times a week    Attends religious service: Never    Active member of club or organization: No    Attends meetings of clubs or organizations: Never    Relationship status: Divorced  Other Topics Concern  . Not on file  Social History Narrative  . Not on file     Family History: The patient's family history includes Bipolar disorder in his maternal grandmother and maternal uncle; Schizophrenia in his father.  ROS:   Please see the history of present illness.    All other systems reviewed and are negative.  EKGs/Labs/Other Studies Reviewed:    The following studies were reviewed today: I discussed my findings with the patient at length including EKG.  This is unremarkable.   Recent Labs: No results found for requested labs within last 8760 hours.  Recent Lipid Panel No results found for: CHOL, TRIG, HDL, CHOLHDL, VLDL, LDLCALC, LDLDIRECT  Physical Exam:    VS:  BP (!) 160/100   Pulse 75   Ht 6' (1.829 m)   Wt 226 lb (102.5 kg)   SpO2 98%   BMI 30.65 kg/m     Wt Readings from Last 3 Encounters:  06/18/18 226 lb (102.5 kg)  02/01/15 220 lb (99.8 kg)     GEN: Patient is in no acute distress HEENT: Normal NECK: No JVD; No carotid bruits LYMPHATICS: No lymphadenopathy CARDIAC: S1 S2 regular, 2/6 systolic murmur at the apex. RESPIRATORY:  Clear to auscultation without rales, wheezing or rhonchi  ABDOMEN: Soft, non-tender, non-distended MUSCULOSKELETAL:  No edema; No deformity  SKIN: Warm and dry NEUROLOGIC:  Alert and oriented x 3 PSYCHIATRIC:  Normal affect    Signed, Roy Lindau, MD  06/18/2018 4:44 PM    [facility]

## 2018-06-21 ENCOUNTER — Telehealth: Payer: Self-pay | Admitting: *Deleted

## 2018-06-21 DIAGNOSIS — I10 Essential (primary) hypertension: Secondary | ICD-10-CM

## 2018-06-21 NOTE — Telephone Encounter (Signed)
Since pt started Lisinopril is having stomach pain onset yesterday and worse today. Please advise.

## 2018-06-21 NOTE — Telephone Encounter (Signed)
Patient fiance answered, she is not on dpr, no dpr on file. Called patients cell phone left message for patient to return call.

## 2018-06-24 NOTE — Telephone Encounter (Signed)
Called patient he reports having right and left sided flank pain since he started lisinopril on 06/18/2018. He called his pcp and they advised for him to stop the medication until he was able to talk to [name]. He has been off of it for 2 days now. He reports his flank pain has gotten better since stopping the lisinopril. He doesn't have any blood pressure readings because he doesn't have a cuff at home. [facility] and [facility]. Will route to Dr. Geraldo Pitter for further recommendation

## 2018-06-24 NOTE — Telephone Encounter (Signed)
Called patient back, he hasn't went to [facility] to have his blood pressure checked yet. He will call us when he does

## 2018-06-24 NOTE — Telephone Encounter (Signed)
Let me know what the pulse and bp is. thanks

## 2018-06-25 ENCOUNTER — Telehealth: Payer: Self-pay | Admitting: Cardiology

## 2018-06-25 NOTE — Telephone Encounter (Signed)
Left message for patient to return call.

## 2018-06-25 NOTE — Telephone Encounter (Signed)
Patient called to report his blood pressure last night was 156/107 and heart rate 98. Will inform Dr. Geraldo Pitter

## 2018-06-25 NOTE — Telephone Encounter (Signed)
Please see previous phone call as patient was supposed to call back with these results

## 2018-06-25 NOTE — Telephone Encounter (Signed)
BP last night was 156/107 pulse 98

## 2018-06-26 MED ORDER — LISINOPRIL 20 MG PO TABS: 40.0000 mg | ORAL_TABLET | Freq: Every day | ORAL | 1 refills | Status: DC

## 2018-06-26 NOTE — Addendum Note (Signed)
Addended by: Ashok Norris on: 06/26/2018 04:10 PM   Modules accepted: Orders

## 2018-06-26 NOTE — Telephone Encounter (Signed)
Patient called back informed him to increase lisinopril 40 mg daily, keep a blood pressure log with pulse and have labs rechecked in 1 week. He verbally understands

## 2018-07-01 ENCOUNTER — Telehealth (HOSPITAL_COMMUNITY): Payer: Self-pay | Admitting: *Deleted

## 2018-07-01 NOTE — Telephone Encounter (Signed)
Left message on voicemail in reference to upcoming appointment scheduled for 07/03/18. Phone number given for a call back so details instructions can be given. Roy Koch

## 2018-07-03 ENCOUNTER — Ambulatory Visit (HOSPITAL_COMMUNITY): Payer: Medicare HMO | Attending: Cardiology

## 2018-07-03 ENCOUNTER — Ambulatory Visit (HOSPITAL_BASED_OUTPATIENT_CLINIC_OR_DEPARTMENT_OTHER): Payer: Medicare HMO

## 2018-07-03 ENCOUNTER — Encounter (HOSPITAL_COMMUNITY): Payer: Self-pay | Admitting: *Deleted

## 2018-07-03 VITALS — Ht 72.0 in | Wt 226.0 lb

## 2018-07-03 DIAGNOSIS — R0789 Other chest pain: Secondary | ICD-10-CM

## 2018-07-03 DIAGNOSIS — I10 Essential (primary) hypertension: Secondary | ICD-10-CM | POA: Insufficient documentation

## 2018-07-03 LAB — ECHOCARDIOGRAM COMPLETE
Height: 72 in
Weight: 3616 oz

## 2018-07-03 MED ORDER — TECHNETIUM TC 99M TETROFOSMIN IV KIT
33.0000 | PACK | Freq: Once | INTRAVENOUS | Status: AC | PRN
  Administered 2018-07-03: 33 via INTRAVENOUS
  Filled 2018-07-03: qty 33

## 2018-07-04 ENCOUNTER — Telehealth: Payer: Self-pay

## 2018-07-04 ENCOUNTER — Ambulatory Visit (HOSPITAL_COMMUNITY): Payer: Medicare HMO | Attending: Cardiology

## 2018-07-04 DIAGNOSIS — R0789 Other chest pain: Secondary | ICD-10-CM | POA: Diagnosis not present

## 2018-07-04 DIAGNOSIS — I10 Essential (primary) hypertension: Secondary | ICD-10-CM | POA: Insufficient documentation

## 2018-07-04 LAB — MYOCARDIAL PERFUSION IMAGING
CHL CUP NUCLEAR SSS: 0
CSEPPHR: 103 {beats}/min
LVDIAVOL: 175 mL (ref 62–150)
LVSYSVOL: 100 mL
Rest HR: 76 {beats}/min
SDS: 0
SRS: 0
TID: 1.01

## 2018-07-04 MED ORDER — REGADENOSON 0.4 MG/5ML IV SOLN
0.4000 mg | Freq: Once | INTRAVENOUS | Status: AC
  Administered 2018-07-04: 0.4 mg via INTRAVENOUS

## 2018-07-04 MED ORDER — TECHNETIUM TC 99M TETROFOSMIN IV KIT
32.4000 | PACK | Freq: Once | INTRAVENOUS | Status: AC | PRN
  Administered 2018-07-04: 32.4 via INTRAVENOUS
  Filled 2018-07-04: qty 33

## 2018-07-04 NOTE — Telephone Encounter (Signed)
Called patient and left detailed voice message on patients phone regarding test results. 

## 2018-07-04 NOTE — Telephone Encounter (Signed)
-----   Message from Jenean Lindau, MD sent at 07/04/2018  3:23 PM EST ----- The results of the study is unremarkable. Please inform patient. I will discuss in detail at next appointment. Cc  primary care/referring physician Jenean Lindau, MD 07/04/2018 3:23 PM

## 2018-07-05 MED FILL — Regadenoson IV Inj 0.4 MG/5ML (0.08 MG/ML): INTRAVENOUS | Qty: 5 | Status: AC

## 2018-07-11 DIAGNOSIS — B079 Viral wart, unspecified: Secondary | ICD-10-CM | POA: Diagnosis not present

## 2018-07-11 DIAGNOSIS — G43909 Migraine, unspecified, not intractable, without status migrainosus: Secondary | ICD-10-CM | POA: Diagnosis not present

## 2018-07-11 DIAGNOSIS — I10 Essential (primary) hypertension: Secondary | ICD-10-CM | POA: Diagnosis not present

## 2018-07-11 DIAGNOSIS — Z87891 Personal history of nicotine dependence: Secondary | ICD-10-CM | POA: Diagnosis not present

## 2018-07-11 DIAGNOSIS — Z6831 Body mass index (BMI) 31.0-31.9, adult: Secondary | ICD-10-CM | POA: Diagnosis not present

## 2018-07-11 DIAGNOSIS — Z1331 Encounter for screening for depression: Secondary | ICD-10-CM | POA: Diagnosis not present

## 2018-07-11 DIAGNOSIS — J449 Chronic obstructive pulmonary disease, unspecified: Secondary | ICD-10-CM | POA: Diagnosis not present

## 2018-07-24 ENCOUNTER — Encounter (HOSPITAL_COMMUNITY): Payer: Self-pay | Admitting: Psychiatry

## 2018-07-24 ENCOUNTER — Ambulatory Visit (INDEPENDENT_AMBULATORY_CARE_PROVIDER_SITE_OTHER): Payer: Medicare HMO | Admitting: Psychiatry

## 2018-07-24 VITALS — BP 130/78 | Ht 72.0 in | Wt 225.0 lb

## 2018-07-24 DIAGNOSIS — G3184 Mild cognitive impairment, so stated: Secondary | ICD-10-CM | POA: Diagnosis not present

## 2018-07-24 DIAGNOSIS — IMO0001 Reserved for inherently not codable concepts without codable children: Secondary | ICD-10-CM

## 2018-07-24 DIAGNOSIS — S069X9D Unspecified intracranial injury with loss of consciousness of unspecified duration, subsequent encounter: Secondary | ICD-10-CM

## 2018-07-24 MED ORDER — CARBAMAZEPINE ER 100 MG PO CP12: ORAL_CAPSULE | ORAL | 5 refills | Status: DC

## 2018-07-24 NOTE — Progress Notes (Signed)
BH MD/PA/NP OP Progress Note  07/24/2018 4:04 PM Roy Koch  MRN:  [id number]  Chief Complaint: Irritability  HPI:  Visit Diagnosis: Mild neurocognitive disorder  Today the patient is seen with his wife.  In a close evaluation as it turns out the patient is not taking Celexa.  In the last month the patient had a myocardial infarction per an EKG.  He was seen by cardiologist and had a thallium scan demonstrating some degree of loss of function.  The patient denies daily depression.  He denies anxiety.  He is sleeping and eating fairly well and has good energy.  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  The patient has multiple cardiovascular risk factors including a family history, hypertension and the fact that he smokes 1 pack/day of cigarettes.  He denies any psychotic symptoms at all.  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  Is been on multiple psychotropic medications including neuroleptics.  His wife shares that the only thing that seems to help with Valium.  I shared with her that unfortunately he does not have a diagnosis that justifies the use of this controlled agent.  Further his behavioral disturbance do not end up being violent.  He ends up being loud and talking in an angry fashion.  This is dysfunctional for his wife to some degree but in ways she is used to it.  She says at least 3 times a week he will get overtly angry and irritable.  As noted in her last interview the patient denies any remorse or any insight to this.  The patient is no clear evidence of a manic condition.  He is never grandiose, never has racing thinking and never has a problem with sleep.  Patient is an 11 year old son who is doing okay.  The patient denies the use of drugs or alcohol.  His irritability seems to be chronic in nature but seems to have gotten worse over the years.  The patient is not homicidal and not suicidal.    No diagnosis found.  Past Psychiatric History: See intake H&P  for full details. Reviewed, with no updates at this time.   Past Medical History:  Past Medical History:  Diagnosis Date  . Anxiety   . Bipolar disorder ([place])   . Cardiac abnormality   . Headache   . Stroke [facility])     Past Surgical History:  Procedure Laterality Date  . AMPUTATION FINGER / THUMB    . CARDIAC SURGERY  at 43 years old    Family Psychiatric History: See intake H&P for full details. Reviewed, with no updates at this time.   Family History:  Family History  Problem Relation Age of Onset  . Schizophrenia Father   . Bipolar disorder Maternal Uncle   . Bipolar disorder Maternal Grandmother     Social History:  Social History   Socioeconomic History  . Marital status: Divorced    Spouse name: Not on file  . Number of children: 2  . Years of education: Not on file  . Highest education level: 11th grade  Occupational History  . Not on file  Social Needs  . Financial resource strain: Somewhat hard  . Food insecurity:    Worry: Never true    Inability: Never true  . Transportation needs:    Medical: No    Non-medical: Not on file  Tobacco Use  . Smoking status: Current Every Day Smoker    Packs/day: 2.00  Types: Cigarettes  . Smokeless tobacco: Never Used  Substance and Sexual Activity  . Alcohol use: No  . Drug use: No  . Sexual activity: Not on file  Lifestyle  . Physical activity:    Days per week: 7 days    Minutes per session: 20 min  . Stress: To some extent  Relationships  . Social connections:    Talks on phone: More than three times a week    Gets together: More than three times a week    Attends religious service: Never    Active member of club or organization: No    Attends meetings of clubs or organizations: Never    Relationship status: Divorced  Other Topics Concern  . Not on file  Social History Narrative  . Not on file    Allergies: No Known Allergies  Metabolic Disorder Labs: No results found for: HGBA1C, MPG No  results found for: PROLACTIN No results found for: CHOL, TRIG, HDL, CHOLHDL, VLDL, LDLCALC No results found for: TSH  Therapeutic Level Labs: No results found for: LITHIUM No results found for: VALPROATE No components found for:  CBMZ  Current Medications: Current Outpatient Medications  Medication Sig Dispense Refill  . albuterol (PROVENTIL HFA;VENTOLIN HFA) 108 (90 Base) MCG/ACT inhaler Inhale 1-2 puffs into the lungs daily as needed.    Marland Kitchen aspirin 325 MG tablet Take 325 mg by mouth daily.     . Butalbital-APAP-Caffeine 50-325-40 MG capsule Take 1 capsule by mouth daily as needed.    Marland Kitchen lisinopril (PRINIVIL,ZESTRIL) 20 MG tablet Take 2 tablets (40 mg total) by mouth daily. 90 tablet 1  . Carbamazepine (EQUETRO) 100 MG CP12 12 hr capsule 1  bid 60 each 5  . doxycycline (VIBRA-TABS) 100 MG tablet Take 100 mg by mouth 2 (two) times daily.     No current facility-administered medications for this visit.     Musculoskeletal: Strength & Muscle Tone: within normal limits Gait & Station: normal Patient leans: N/A  Psychiatric Specialty Exam: ROS  Blood pressure 130/78, height 6' (1.829 m), weight 225 lb (102.1 kg), SpO2 98 %.Body mass index is 30.52 kg/m.  General Appearance: Casual and Disheveled  Eye Contact:  Fair  Speech:  Clear and Coherent and Normal Rate  Volume:  Normal  Mood:  Less irritable  Affect:  Appropriate and Congruent  Thought Process:  Goal Directed and Descriptions of Associations: Intact  Orientation:  Full (Time, Place, and Person)  Thought Content: Logical and Focused on Xanax   Suicidal Thoughts:  No  Homicidal Thoughts:  No  Memory:  Immediate;   Poor  Judgement:  Fair  Insight:  Present, Shallow and Improving  Psychomotor Activity:  Normal  Concentration:  Concentration: Fair  Recall:  [employer] of Knowledge: Fair  Language: Fair  Akathisia:  Negative  Handed:  Right  AIMS (if indicated): not done  Assets:  Communication Skills Desire for  Improvement  ADL's:  Intact  Cognition: WNL  Sleep:  Fair   Screenings: PHQ2-9     Patient Outreach Telephone from 03/12/2017 in [place]  PHQ-2 Total Score  2  PHQ-9 Total Score  4       Assessment and Plan  At this time the patient denies chest pain or shortness of breath.  He denies any neurological symptoms at this time.  At this time we will go ahead and begin him on  Equetrol 100 mg twice daily.  The patient was told to call if  there are any problems with his agent.  According to his wife at least 3 times a week he becomes very irritable and explosive.  He does not actually get violent but he is very distressed.  He is she agreed to try this medication and return to see me in about 2-1/2 months.  In essence in 1 month.  He is likely to be explosive at least 15 times.  When he sees [name] at her next visit we will ask him how frequent his explosions are.  They occur for the smallest insignificant reasons.  They do not need to violence but he is clearly very distressed.  It is very hard for him to be around any other people other than his wife who can tolerate this.  In this visit the patient is cooperative and compliant.  It is noted that he has had multiple strokes multiple cardiac procedures.  The patient was told to call us if there are any problems with his medicines. His diagnosis is mild neurocognitive disorder, vascular disease with behavioral disturbance.  Status of current problems: new to [employer] Ordered: No orders of the defined types were placed in this encounter.   Labs Reviewed: na  Collateral Obtained/Records Reviewed: Wife is present and able to corroborate episodic agitation and explosive behaviors when he is angry  Plan:    Equetro 100 mg twice daily.  The patient return to see [name] in 2-1/2 months and shared how often he is been explosive.  At that time we will get blood work including a comprehensive metabolic panel.  Jerral Ralph,  MD 07/24/2018, 4:04 PM

## 2018-07-28 DIAGNOSIS — R0602 Shortness of breath: Secondary | ICD-10-CM | POA: Diagnosis not present

## 2018-07-28 DIAGNOSIS — R079 Chest pain, unspecified: Secondary | ICD-10-CM | POA: Diagnosis not present

## 2018-07-28 DIAGNOSIS — Z7982 Long term (current) use of aspirin: Secondary | ICD-10-CM | POA: Diagnosis not present

## 2018-07-28 DIAGNOSIS — I959 Hypotension, unspecified: Secondary | ICD-10-CM | POA: Diagnosis not present

## 2018-07-28 DIAGNOSIS — R0682 Tachypnea, not elsewhere classified: Secondary | ICD-10-CM | POA: Diagnosis not present

## 2018-07-28 DIAGNOSIS — Z8673 Personal history of transient ischemic attack (TIA), and cerebral infarction without residual deficits: Secondary | ICD-10-CM | POA: Diagnosis not present

## 2018-07-28 DIAGNOSIS — J449 Chronic obstructive pulmonary disease, unspecified: Secondary | ICD-10-CM | POA: Diagnosis not present

## 2018-07-28 DIAGNOSIS — F17210 Nicotine dependence, cigarettes, uncomplicated: Secondary | ICD-10-CM | POA: Diagnosis not present

## 2018-07-28 DIAGNOSIS — J209 Acute bronchitis, unspecified: Secondary | ICD-10-CM | POA: Diagnosis not present

## 2018-07-28 DIAGNOSIS — R0600 Dyspnea, unspecified: Secondary | ICD-10-CM | POA: Diagnosis not present

## 2018-07-28 DIAGNOSIS — R000 Tachycardia, unspecified: Secondary | ICD-10-CM | POA: Diagnosis not present

## 2018-07-28 DIAGNOSIS — I252 Old myocardial infarction: Secondary | ICD-10-CM | POA: Diagnosis not present

## 2018-07-28 DIAGNOSIS — R457 State of emotional shock and stress, unspecified: Secondary | ICD-10-CM | POA: Diagnosis not present

## 2018-09-18 ENCOUNTER — Other Ambulatory Visit: Payer: Self-pay

## 2018-09-18 ENCOUNTER — Ambulatory Visit (HOSPITAL_COMMUNITY): Payer: Medicare HMO | Admitting: Psychiatry

## 2018-11-12 DIAGNOSIS — I10 Essential (primary) hypertension: Secondary | ICD-10-CM | POA: Diagnosis not present

## 2018-11-12 DIAGNOSIS — R0789 Other chest pain: Secondary | ICD-10-CM | POA: Diagnosis not present

## 2018-11-12 DIAGNOSIS — F319 Bipolar disorder, unspecified: Secondary | ICD-10-CM | POA: Diagnosis not present

## 2018-11-12 DIAGNOSIS — M79642 Pain in left hand: Secondary | ICD-10-CM | POA: Diagnosis not present

## 2018-11-12 DIAGNOSIS — Z6831 Body mass index (BMI) 31.0-31.9, adult: Secondary | ICD-10-CM | POA: Diagnosis not present

## 2018-12-19 ENCOUNTER — Ambulatory Visit (INDEPENDENT_AMBULATORY_CARE_PROVIDER_SITE_OTHER): Payer: Medicare HMO | Admitting: Psychiatry

## 2018-12-19 ENCOUNTER — Other Ambulatory Visit: Payer: Self-pay

## 2018-12-19 DIAGNOSIS — I999 Unspecified disorder of circulatory system: Secondary | ICD-10-CM | POA: Diagnosis not present

## 2018-12-19 DIAGNOSIS — F919 Conduct disorder, unspecified: Secondary | ICD-10-CM | POA: Diagnosis not present

## 2018-12-19 DIAGNOSIS — G3184 Mild cognitive impairment, so stated: Secondary | ICD-10-CM | POA: Diagnosis not present

## 2018-12-19 DIAGNOSIS — F09 Unspecified mental disorder due to known physiological condition: Secondary | ICD-10-CM | POA: Diagnosis not present

## 2018-12-19 MED ORDER — CARBAMAZEPINE ER 100 MG PO CP12: ORAL_CAPSULE | ORAL | 5 refills | Status: DC

## 2018-12-19 MED ORDER — TRAZODONE HCL 100 MG PO TABS: ORAL_TABLET | ORAL | 4 refills | Status: DC

## 2018-12-19 NOTE — Progress Notes (Signed)
BH MD/PA/NP OP Progress Note  12/19/2018 3:16 PM Roy Koch  MRN:  [id number]  Chief Complaint: Irritability  HPI:  Visit Diagnosis: Mild neurocognitive disorder  At this time the patient is not changed.  He still has verbal explosions on a regular basis.  Today we had a long discussion with his fiance [name].  She says she has difficulty getting him to take his Tegretol but mainly because the pharmacist does not seem to be delivering the right dose.  We will clarify that he should be taking 1 twice a day.  We will send the order again.  Another factor that may be creating irritability is problems sleeping.  Today we will prescribe trazodone to help him sleep.  We will see him back here in 6 or 7 weeks.  He shows no signs of being depressed.  He is showing no signs of psychosis.  He essentially is explosive most likely as result of his brain injury.  I am attempting to treat him with Tegretol for irritability from brain injury.  I have to get him a good night sleep which I think would help his irritability as well.  No diagnosis found.  Past Psychiatric History: See intake H&P for full details. Reviewed, with no updates at this time.   Past Medical History:  Past Medical History:  Diagnosis Date  . Anxiety   . Bipolar disorder ([place])   . Cardiac abnormality   . Headache   . Stroke [facility])     Past Surgical History:  Procedure Laterality Date  . AMPUTATION FINGER / THUMB    . CARDIAC SURGERY  at 43 years old    Family Psychiatric History: See intake H&P for full details. Reviewed, with no updates at this time.   Family History:  Family History  Problem Relation Age of Onset  . Schizophrenia Father   . Bipolar disorder Maternal Uncle   . Bipolar disorder Maternal Grandmother     Social History:  Social History   Socioeconomic History  . Marital status: Divorced    Spouse name: Not on file  . Number of children: 2  . Years of education: Not on file  . Highest education  level: 11th grade  Occupational History  . Not on file  Social Needs  . Financial resource strain: Somewhat hard  . Food insecurity    Worry: Never true    Inability: Never true  . Transportation needs    Medical: No    Non-medical: Not on file  Tobacco Use  . Smoking status: Current Every Day Smoker    Packs/day: 2.00    Types: Cigarettes  . Smokeless tobacco: Never Used  Substance and Sexual Activity  . Alcohol use: No  . Drug use: No  . Sexual activity: Not on file  Lifestyle  . Physical activity    Days per week: 7 days    Minutes per session: 20 min  . Stress: To some extent  Relationships  . Social connections    Talks on phone: More than three times a week    Gets together: More than three times a week    Attends religious service: Never    Active member of club or organization: No    Attends meetings of clubs or organizations: Never    Relationship status: Divorced  Other Topics Concern  . Not on file  Social History Narrative  . Not on file    Allergies: No Known Allergies  Metabolic Disorder Labs: No  results found for: HGBA1C, MPG No results found for: PROLACTIN No results found for: CHOL, TRIG, HDL, CHOLHDL, VLDL, LDLCALC No results found for: TSH  Therapeutic Level Labs: No results found for: LITHIUM No results found for: VALPROATE No components found for:  CBMZ  Current Medications: Current Outpatient Medications  Medication Sig Dispense Refill  . albuterol (PROVENTIL HFA;VENTOLIN HFA) 108 (90 Base) MCG/ACT inhaler Inhale 1-2 puffs into the lungs daily as needed.    Marland Kitchen aspirin 325 MG tablet Take 325 mg by mouth daily.     . Butalbital-APAP-Caffeine 50-325-40 MG capsule Take 1 capsule by mouth daily as needed.    . Carbamazepine (EQUETRO) 100 MG CP12 12 hr capsule 1  bid 60 capsule 5  . doxycycline (VIBRA-TABS) 100 MG tablet Take 100 mg by mouth 2 (two) times daily.    Marland Kitchen lisinopril (PRINIVIL,ZESTRIL) 20 MG tablet Take 2 tablets (40 mg total) by  mouth daily. 90 tablet 1  . traZODone (DESYREL) 100 MG tablet 1  qhs  After 3 days if not sleeping 2 qhs 60 tablet 4   No current facility-administered medications for this visit.     Musculoskeletal: Strength & Muscle Tone: within normal limits Gait & Station: normal Patient leans: N/A  Psychiatric Specialty Exam: ROS  There were no vitals taken for this visit.There is no height or weight on file to calculate BMI.  General Appearance: Casual and Disheveled  Eye Contact:  Fair  Speech:  Clear and Coherent and Normal Rate  Volume:  Normal  Mood:  Less irritable  Affect:  Appropriate and Congruent  Thought Process:  Goal Directed and Descriptions of Associations: Intact  Orientation:  Full (Time, Place, and Person)  Thought Content: Logical and Focused on Xanax   Suicidal Thoughts:  No  Homicidal Thoughts:  No  Memory:  Immediate;   Poor  Judgement:  Fair  Insight:  Present, Shallow and Improving  Psychomotor Activity:  Normal  Concentration:  Concentration: Fair  Recall:  [employer] of Knowledge: Fair  Language: Fair  Akathisia:  Negative  Handed:  Right  AIMS (if indicated): not done  Assets:  Communication Skills Desire for Improvement  ADL's:  Intact  Cognition: WNL  Sleep:  Fair   Screenings: PHQ2-9     Patient Outreach Telephone from 03/12/2017 in [place]  PHQ-2 Total Score  2  PHQ-9 Total Score  4       Assessment and Plan   At this time this patient who was diagnosed with a mild neurocognitive disorder most likely that of vascular disease will continue taking a fixed dose of Tegretol 100 mg twice daily.  Today we will start him on trazodone as well.  We will take 100 mg and if that does not work he will double the dose.  Return to see me in 7 weeks.  The patient is not violent he is not homicidal he is not suicidal.  There is no evidence that he is drinking any alcohol or using any drugs. His diagnosis is mild neurocognitive disorder,  vascular disease with behavioral disturbance.  Status of current problems: new to [employer] Ordered: No orders of the defined types were placed in this encounter.   Labs Reviewed: na  Collateral Obtained/Records Reviewed: Wife is present and able to corroborate episodic agitation and explosive behaviors when he is angry  Plan:    Equetro 100 mg twice daily.  The patient return to see [name] in 2-1/2 months and shared how  often he is been explosive.  At that time we will get blood work including a comprehensive metabolic panel.  Jerral Ralph, MD 12/19/2018, 3:16 PM

## 2019-01-09 DIAGNOSIS — I10 Essential (primary) hypertension: Secondary | ICD-10-CM | POA: Diagnosis not present

## 2019-01-09 DIAGNOSIS — J0130 Acute sphenoidal sinusitis, unspecified: Secondary | ICD-10-CM | POA: Diagnosis not present

## 2019-01-09 DIAGNOSIS — G43909 Migraine, unspecified, not intractable, without status migrainosus: Secondary | ICD-10-CM | POA: Diagnosis not present

## 2019-01-09 DIAGNOSIS — J449 Chronic obstructive pulmonary disease, unspecified: Secondary | ICD-10-CM | POA: Diagnosis not present

## 2019-02-05 DIAGNOSIS — Z20828 Contact with and (suspected) exposure to other viral communicable diseases: Secondary | ICD-10-CM | POA: Diagnosis not present

## 2019-04-28 DIAGNOSIS — J449 Chronic obstructive pulmonary disease, unspecified: Secondary | ICD-10-CM | POA: Diagnosis not present

## 2019-04-28 DIAGNOSIS — Z20828 Contact with and (suspected) exposure to other viral communicable diseases: Secondary | ICD-10-CM | POA: Diagnosis not present

## 2019-06-20 DIAGNOSIS — J111 Influenza due to unidentified influenza virus with other respiratory manifestations: Secondary | ICD-10-CM | POA: Diagnosis not present

## 2019-06-20 DIAGNOSIS — J449 Chronic obstructive pulmonary disease, unspecified: Secondary | ICD-10-CM | POA: Diagnosis not present

## 2019-06-23 DIAGNOSIS — J441 Chronic obstructive pulmonary disease with (acute) exacerbation: Secondary | ICD-10-CM | POA: Diagnosis not present

## 2019-06-23 DIAGNOSIS — Z20822 Contact with and (suspected) exposure to covid-19: Secondary | ICD-10-CM | POA: Diagnosis not present

## 2019-08-04 DIAGNOSIS — J019 Acute sinusitis, unspecified: Secondary | ICD-10-CM | POA: Diagnosis not present

## 2019-08-08 ENCOUNTER — Ambulatory Visit (INDEPENDENT_AMBULATORY_CARE_PROVIDER_SITE_OTHER): Payer: Medicare HMO | Admitting: Psychiatry

## 2019-08-08 ENCOUNTER — Other Ambulatory Visit: Payer: Self-pay

## 2019-08-08 DIAGNOSIS — F6381 Intermittent explosive disorder: Secondary | ICD-10-CM

## 2019-08-08 MED ORDER — CARBAMAZEPINE ER 100 MG PO CP12: ORAL_CAPSULE | ORAL | 5 refills | Status: DC

## 2019-08-08 MED ORDER — TRAZODONE HCL 100 MG PO TABS: ORAL_TABLET | ORAL | 4 refills | Status: DC

## 2019-08-08 NOTE — Progress Notes (Signed)
BH MD/PA/NP OP Progress Note  08/08/2019 10:25 AM Roy Koch  MRN:  [id number]  Chief Complaint: Irritability  HPI: Today the patient comes the phone late.  He did not answer initially and then he had a call back.  Therefore this was a short abbreviated visit.  It is very confusing.  I spoke to the pharmacy and for some reason his Tegretol was never received.  The patient is not taking Tegretol nor is he taking trazodone.  We will again restart this process.  He will start on Equetro 100 mg twice daily and take trazodone at night.  The patient says he still irritable feels even more explosive.  Unfortunately his wife Roy Koch is not there.  They are having trouble communicating with this patient and his fiance.  We will go ahead and give him another appointment to be seen in about 3 months in the office.  We will call in all the medications.  Visit Diagnosis: Mild neurocognitive disorder    No diagnosis found.  Past Psychiatric History: See intake H&P for full details. Reviewed, with no updates at this time.   Past Medical History:  Past Medical History:  Diagnosis Date  . Anxiety   . Bipolar disorder ([place])   . Cardiac abnormality   . Headache   . Stroke [facility])     Past Surgical History:  Procedure Laterality Date  . AMPUTATION FINGER / THUMB    . CARDIAC SURGERY  at 44 years old    Family Psychiatric History: See intake H&P for full details. Reviewed, with no updates at this time.   Family History:  Family History  Problem Relation Age of Onset  . Schizophrenia Father   . Bipolar disorder Maternal Uncle   . Bipolar disorder Maternal Grandmother     Social History:  Social History   Socioeconomic History  . Marital status: Divorced    Spouse name: Not on file  . Number of children: 2  . Years of education: Not on file  . Highest education level: 11th grade  Occupational History  . Not on file  Tobacco Use  . Smoking status: Current Every Day Smoker   Packs/day: 2.00    Types: Cigarettes  . Smokeless tobacco: Never Used  Substance and Sexual Activity  . Alcohol use: No  . Drug use: No  . Sexual activity: Not on file  Other Topics Concern  . Not on file  Social History Narrative  . Not on file   Social Determinants of Health   Financial Resource Strain:   . Difficulty of Paying Living Expenses:   Food Insecurity:   . Worried About Charity fundraiser in the Last Year:   . Arboriculturist in the Last Year:   Transportation Needs:   . Film/video editor (Medical):   Marland Kitchen Lack of Transportation (Non-Medical):   Physical Activity:   . Days of Exercise per Week:   . Minutes of Exercise per Session:   Stress:   . Feeling of Stress :   Social Connections:   . Frequency of Communication with Friends and Family:   . Frequency of Social Gatherings with Friends and Family:   . Attends Religious Services:   . Active Member of Clubs or Organizations:   . Attends Archivist Meetings:   Marland Kitchen Marital Status:     Allergies: No Known Allergies  Metabolic Disorder Labs: No results found for: HGBA1C, MPG No results found for: PROLACTIN No results found  for: CHOL, TRIG, HDL, CHOLHDL, VLDL, LDLCALC No results found for: TSH  Therapeutic Level Labs: No results found for: LITHIUM No results found for: VALPROATE No components found for:  CBMZ  Current Medications: Current Outpatient Medications  Medication Sig Dispense Refill  . albuterol (PROVENTIL HFA;VENTOLIN HFA) 108 (90 Base) MCG/ACT inhaler Inhale 1-2 puffs into the lungs daily as needed.    Marland Kitchen aspirin 325 MG tablet Take 325 mg by mouth daily.     . Butalbital-APAP-Caffeine 50-325-40 MG capsule Take 1 capsule by mouth daily as needed.    . Carbamazepine (EQUETRO) 100 MG CP12 12 hr capsule 1  bid 60 capsule 5  . doxycycline (VIBRA-TABS) 100 MG tablet Take 100 mg by mouth 2 (two) times daily.    Marland Kitchen lisinopril (PRINIVIL,ZESTRIL) 20 MG tablet Take 2 tablets (40 mg total) by  mouth daily. 90 tablet 1  . traZODone (DESYREL) 100 MG tablet 1  qhs  After 3 days if not sleeping 2 qhs 60 tablet 4   No current facility-administered medications for this visit.    Musculoskeletal: Strength & Muscle Tone: within normal limits Gait & Station: normal Patient leans: N/A  Psychiatric Specialty Exam: ROS  There were no vitals taken for this visit.There is no height or weight on file to calculate BMI.  General Appearance: Casual and Disheveled  Eye Contact:  Fair  Speech:  Clear and Coherent and Normal Rate  Volume:  Normal  Mood:  Less irritable  Affect:  Appropriate and Congruent  Thought Process:  Goal Directed and Descriptions of Associations: Intact  Orientation:  Full (Time, Place, and Person)  Thought Content: Logical and Focused on Xanax   Suicidal Thoughts:  No  Homicidal Thoughts:  No  Memory:  Immediate;   Poor  Judgement:  Fair  Insight:  Present, Shallow and Improving  Psychomotor Activity:  Normal  Concentration:  Concentration: Fair  Recall:  [employer] of Knowledge: Fair  Language: Fair  Akathisia:  Negative  Handed:  Right  AIMS (if indicated): not done  Assets:  Communication Skills Desire for Improvement  ADL's:  Intact  Cognition: WNL  Sleep:  Fair   Screenings: PHQ2-9     Patient Outreach Telephone from 03/12/2017 in [place]  PHQ-2 Total Score  2  PHQ-9 Total Score  4       Assessment and Plan   At this time the patient's diagnosis is mild neurocognitive disorder vascular disease with a behavioral disturbance.  He is having intense irritability you the does not really get violent but gets very very uncomfortable for him in his environment.  Today once again we will Tegretol 100 mg ( Equetrol) twice daily and also some trazodone.  Those problems are behavioral disturbance and I think insomnia.  Patient will be seen again in the next 2 to 3 months ideally in person.  At that time we will obtain a blood level and a  comprehensive metabolic panel.  His diagnosis is mild neurocognitive disorder, vascular disease with behavioral disturbance.  Status of current problems: new to [employer] Ordered: No orders of the defined types were placed in this encounter.   Labs Reviewed: na  Collateral Obtained/Records Reviewed: Wife is present and able to corroborate episodic agitation and explosive behaviors when he is angry  Plan:    Equetro 100 mg twice daily.  The patient return to see [name] in 2-1/2 months and shared how often he is been explosive.  At that time we will  get blood work including a Advertising account executive.  Jerral Ralph, MD 08/08/2019, 10:25 AM

## 2019-08-19 DIAGNOSIS — Z87891 Personal history of nicotine dependence: Secondary | ICD-10-CM | POA: Diagnosis not present

## 2019-08-19 DIAGNOSIS — Z6832 Body mass index (BMI) 32.0-32.9, adult: Secondary | ICD-10-CM | POA: Diagnosis not present

## 2019-08-19 DIAGNOSIS — J449 Chronic obstructive pulmonary disease, unspecified: Secondary | ICD-10-CM | POA: Diagnosis not present

## 2019-08-19 DIAGNOSIS — J309 Allergic rhinitis, unspecified: Secondary | ICD-10-CM | POA: Diagnosis not present

## 2019-08-19 DIAGNOSIS — I10 Essential (primary) hypertension: Secondary | ICD-10-CM | POA: Diagnosis not present

## 2019-08-31 ENCOUNTER — Encounter (HOSPITAL_COMMUNITY): Payer: Self-pay | Admitting: Emergency Medicine

## 2019-08-31 ENCOUNTER — Emergency Department (HOSPITAL_COMMUNITY)
Admission: EM | Admit: 2019-08-31 | Discharge: 2019-08-31 | Disposition: A | Discharge status: Home / Self Care | Payer: Medicare HMO | Attending: Emergency Medicine | Admitting: Emergency Medicine

## 2019-08-31 ENCOUNTER — Other Ambulatory Visit: Payer: Self-pay

## 2019-08-31 DIAGNOSIS — I10 Essential (primary) hypertension: Secondary | ICD-10-CM | POA: Diagnosis not present

## 2019-08-31 DIAGNOSIS — K0889 Other specified disorders of teeth and supporting structures: Secondary | ICD-10-CM | POA: Insufficient documentation

## 2019-08-31 DIAGNOSIS — F17210 Nicotine dependence, cigarettes, uncomplicated: Secondary | ICD-10-CM | POA: Diagnosis not present

## 2019-08-31 DIAGNOSIS — F319 Bipolar disorder, unspecified: Secondary | ICD-10-CM | POA: Insufficient documentation

## 2019-08-31 DIAGNOSIS — Z7982 Long term (current) use of aspirin: Secondary | ICD-10-CM | POA: Insufficient documentation

## 2019-08-31 MED ORDER — LIDOCAINE VISCOUS HCL 2 % MT SOLN: 15.0000 mL | OROMUCOSAL | 2 refills | Status: DC | PRN

## 2019-08-31 MED ORDER — BUPIVACAINE-EPINEPHRINE (PF) 0.5% -1:200000 IJ SOLN
1.8000 mL | Freq: Once | INTRAMUSCULAR | Status: AC
  Administered 2019-08-31: 1.8 mL

## 2019-08-31 NOTE — ED Notes (Signed)
Patient verbalizes understanding of discharge instructions. Opportunity for questioning and answers were provided. Armband removed by staff, pt discharged from ED ambulatory.   

## 2019-08-31 NOTE — ED Provider Notes (Signed)
[facility] EMERGENCY DEPARTMENT Provider Note   CSN: [id number] Arrival date & time: 08/31/19  [id number]     History Chief Complaint  Patient presents with  . Dental Pain    Roy Koch is a 44 y.o. male.  HPI      Roy Koch is a 44 y.o. male, with a history of anxiety, bipolar, stroke, presenting to the ED with dental pain that he states has been going on for "a long while," but has worsened over the last couple days.  Pain is to the left upper teeth, aching, moderate to severe, nonradiating. He has been using ibuprofen and topical analgesic liquid. He is 7 days into a 10-day course of Augmentin prescribed for sinus infection.  Denies fever/chills, nausea/vomiting, persistent facial swelling, difficulty swallowing or breathing, or any other complaints.  Past Medical History:  Diagnosis Date  . Anxiety   . Bipolar disorder ([place])   . Cardiac abnormality   . Headache   . Stroke [facility])     Patient Active Problem List   Diagnosis Date Noted  . Chest discomfort 06/18/2018  . Essential hypertension 06/18/2018  . Cigarette smoker 06/18/2018  . History of repair of congenital atrial septal defect (ASD) 06/18/2018  . Generalized anxiety disorder 09/15/2017  . Cardiac abnormality   . Low back pain 07/14/2013    Past Surgical History:  Procedure Laterality Date  . AMPUTATION FINGER / THUMB    . CARDIAC SURGERY  at 44 years old       Family History  Problem Relation Age of Onset  . Schizophrenia Father   . Bipolar disorder Maternal Uncle   . Bipolar disorder Maternal Grandmother     Social History   Tobacco Use  . Smoking status: Current Every Day Smoker    Packs/day: 2.00    Types: Cigarettes  . Smokeless tobacco: Never Used  Substance Use Topics  . Alcohol use: No  . Drug use: No    Home Medications Prior to Admission medications   Medication Sig Start Date End Date Taking? Authorizing Provider  albuterol (PROVENTIL HFA;VENTOLIN  HFA) 108 (90 Base) MCG/ACT inhaler Inhale 1-2 puffs into the lungs daily as needed. 06/06/18   [provider]  aspirin 325 MG tablet Take 325 mg by mouth daily.     [provider]  Butalbital-APAP-Caffeine [phone] MG capsule Take 1 capsule by mouth daily as needed. 06/10/18   [provider]  Carbamazepine (EQUETRO) 100 MG CP12 12 hr capsule 1  bid 08/08/19   Plovsky, Berneta Sages, MD  doxycycline (VIBRA-TABS) 100 MG tablet Take 100 mg by mouth 2 (two) times daily. 06/10/18   [provider]  lidocaine (XYLOCAINE) 2 % solution Use as directed 15 mLs in the mouth or throat as needed for mouth pain. 08/31/19   Derak Schurman C, PA-C  lisinopril (PRINIVIL,ZESTRIL) 20 MG tablet Take 2 tablets (40 mg total) by mouth daily. 06/26/18 09/24/18  Revankar, Reita Cliche, MD  traZODone (DESYREL) 100 MG tablet 1  qhs  After 3 days if not sleeping 2 qhs 08/08/19   Norma Fredrickson, MD    Allergies    Patient has no known allergies.  Review of Systems   Review of Systems  Constitutional: Negative for chills and fever.  HENT: Positive for dental problem. Negative for ear pain, facial swelling, trouble swallowing and voice change.   Respiratory: Negative for shortness of breath.   Cardiovascular: Negative for chest pain.  Gastrointestinal: Negative for nausea and  vomiting.  Musculoskeletal: Negative for neck pain and neck stiffness.    Physical Exam Updated Vital Signs BP (!) 143/92 (BP Location: Right Arm)   Pulse 72   Temp 98.5 F (36.9 C) (Oral)   Resp 18   Ht 6' (1.829 m)   Wt 104.3 kg   SpO2 99%   BMI 31.19 kg/m   Physical Exam Vitals and nursing note reviewed.  Constitutional:      General: He is not in acute distress.    Appearance: He is well-developed. He is not diaphoretic.  HENT:     Head: Normocephalic and atraumatic.     Right Ear: Tympanic membrane, ear canal and external ear normal.     Left Ear: Tympanic membrane, ear canal and external ear normal.      Mouth/Throat:     Mouth: Mucous membranes are moist.     Comments: Poor dentition throughout.  Erosion specifically noted to the left maxillary molars and premolars.  No noted pulp exposure.  Tenderness in the same region. Dentition appears to be stable.  No noted area of intraoral swelling or fluctuance.  No trismus or noted abnormal phonation.  Mouth opening to at least 3 finger widths.  Handles oral secretions without difficulty.  No noted facial swelling.  No sublingual swelling.  No swelling or tenderness to the submental or submandibular regions.  No swelling or tenderness into the soft tissues of the neck. Eyes:     Conjunctiva/sclera: Conjunctivae normal.  Cardiovascular:     Rate and Rhythm: Normal rate and regular rhythm.  Pulmonary:     Effort: Pulmonary effort is normal.  Musculoskeletal:     Cervical back: Normal range of motion and neck supple. No tenderness.  Lymphadenopathy:     Cervical: No cervical adenopathy.  Skin:    General: Skin is warm and dry.     Coloration: Skin is not pale.  Neurological:     Mental Status: He is alert.  Psychiatric:        Behavior: Behavior normal.     ED Results / Procedures / Treatments   Labs (all labs ordered are listed, but only abnormal results are displayed) Labs Reviewed - No data to display  EKG None  Radiology No results found.  Procedures Dental Block  Date/Time: 08/31/2019 11:15 AM Performed by: Lorayne Bender, PA-C Authorized by: Lorayne Bender, PA-C   Consent:    Consent obtained:  Verbal   Consent given by:  Patient   Risks discussed:  Nerve damage, swelling, unsuccessful block, pain and hematoma Indications:    Indications: dental pain   Procedure details (see MAR for exact dosages):    Topical anesthetic:  Benzocaine gel   Syringe type:  Controlled syringe   Needle gauge:  27 G   Anesthetic injected:  Bupivacaine 0.5% WITH epi   Injection procedure:  Anatomic landmarks identified, anatomic landmarks  palpated, introduced needle, negative aspiration for blood and incremental injection Post-procedure details:    Outcome:  Anesthesia achieved   Patient tolerance of procedure:  Tolerated well, no immediate complications   (including critical care time)  Medications Ordered in ED Medications  bupivacaine-epinephrine (MARCAINE W/ EPI) 0.5% -1:200000 injection 1.8 mL (1.8 mLs Infiltration Given 08/31/19 1114)    ED Course  I have reviewed the triage vital signs and the nursing notes.  Pertinent labs & imaging results that were available during my care of the patient were reviewed by me and considered in my medical decision making (see  chart for details).    MDM Rules/Calculators/A&P                      Patient presents with dental pain.  Low suspicion for sepsis or Ludwig's angioedema.  Dental block performed with successful pain relief. Dental follow-up recommended.  Patient is already on an appropriate antibiotic. The patient was given instructions for home care as well as return precautions. Patient voices understanding of these instructions, accepts the plan, and is comfortable with discharge.    Final Clinical Impression(s) / ED Diagnoses Final diagnoses:  Pain, dental    Rx / DC Orders ED Discharge Orders         Ordered    lidocaine (XYLOCAINE) 2 % solution  As needed     08/31/19 [id number] PA-C 08/31/19 1125    Pattricia Boss, MD 09/01/19 1436

## 2019-08-31 NOTE — ED Triage Notes (Signed)
Patient c/o four teeth to left upper mouth causing pain for "a while now" but pain increasing yesterday. States he has been taking penicillin from a doctor for a different complaint.

## 2019-08-31 NOTE — Discharge Instructions (Addendum)
°  Dental Pain °You have been seen today for dental pain. You should follow up with a dentist as soon as possible. This problem will not resolve on its own without the care of a dentist.  °Lidocaine liquid: Use the viscous lidocaine for mouth pain. Swish with the lidocaine and spit it out. Do not swallow it. °Salt water solution: You should also swish with a homemade salt water solution, twice a day.  Make this solution by mixing 8 ounces of warm water with about half a teaspoon of salt. °Antiinflammatory medications: Take 600 mg of ibuprofen every 6 hours or 440 mg (over the counter dose) to 500 mg (prescription dose) of naproxen every 12 hours for the next 3 days. After this time, these medications may be used as needed for pain. Take these medications with food to avoid upset stomach. Choose only one of these medications, do not take them together. °Acetaminophen (generic for Tylenol): Should you continue to have additional pain while taking the ibuprofen or naproxen, you may add in acetaminophen as needed. Your daily total maximum amount of acetaminophen from all sources should be limited to 4000mg/day for persons without liver problems, or 2000mg/day for those with liver problems. ° °Please take all of your antibiotics until finished!   You may develop abdominal discomfort or diarrhea from the antibiotic.  You may help offset this with probiotics which you can buy or get in yogurt. Do not eat or take the probiotics until 2 hours after your antibiotic.  ° °For prescription assistance, may try using prescription discount sites or apps, such as goodrx.com °

## 2019-10-17 ENCOUNTER — Ambulatory Visit (INDEPENDENT_AMBULATORY_CARE_PROVIDER_SITE_OTHER): Payer: Medicare HMO | Admitting: Psychiatry

## 2019-10-17 ENCOUNTER — Other Ambulatory Visit: Payer: Self-pay

## 2019-10-17 DIAGNOSIS — F6381 Intermittent explosive disorder: Secondary | ICD-10-CM | POA: Diagnosis not present

## 2019-10-17 MED ORDER — CARBAMAZEPINE ER 200 MG PO CP12: ORAL_CAPSULE | ORAL | 5 refills | Status: DC

## 2019-10-17 NOTE — Progress Notes (Signed)
BH MD/PA/NP OP Progress Note  10/17/2019 9:43 AM Roy Koch  MRN:  [id number]  Chief Complaint: Irritability  HPI:   Visit Diagnosis: Mild neurocognitive disorder  Today we spoke with the patient's care.  We also spoke with the patient for a short period of time.  Ways very irritable on a nearly daily basis.  The patient seems to be stable on interview.  He denies being depressed.  He is very tense and on edge and things got on his nerves easily.  He is not violent or aggressive according to his wife.  He actually is sleeping and eating well and has good energy.  He drinks no alcohol uses no drugs.  He denies any chest pain shortness of breath or any neurological symptoms at this time.  He is under the care of Dr. Samul Dada in [place] and seems to be doing fairly well.  Patient is not suicidal nor she homicidal.  He has had no side effects from the carbamazepine.  Past Psychiatric History: See intake H&P for full details. Reviewed, with no updates at this time.   Past Medical History:  Past Medical History:  Diagnosis Date  . Anxiety   . Bipolar disorder ([place])   . Cardiac abnormality   . Headache   . Stroke [facility])     Past Surgical History:  Procedure Laterality Date  . AMPUTATION FINGER / THUMB    . CARDIAC SURGERY  at 44 years old    Family Psychiatric History: See intake H&P for full details. Reviewed, with no updates at this time.   Family History:  Family History  Problem Relation Age of Onset  . Schizophrenia Father   . Bipolar disorder Maternal Uncle   . Bipolar disorder Maternal Grandmother     Social History:  Social History   Socioeconomic History  . Marital status: Divorced    Spouse name: Not on file  . Number of children: 2  . Years of education: Not on file  . Highest education level: 11th grade  Occupational History  . Not on file  Tobacco Use  . Smoking status: Current Every Day Smoker    Packs/day: 2.00    Types: Cigarettes  . Smokeless  tobacco: Never Used  Substance and Sexual Activity  . Alcohol use: No  . Drug use: No  . Sexual activity: Not on file  Other Topics Concern  . Not on file  Social History Narrative  . Not on file   Social Determinants of Health   Financial Resource Strain:   . Difficulty of Paying Living Expenses:   Food Insecurity:   . Worried About Charity fundraiser in the Last Year:   . Arboriculturist in the Last Year:   Transportation Needs:   . Film/video editor (Medical):   Marland Kitchen Lack of Transportation (Non-Medical):   Physical Activity:   . Days of Exercise per Week:   . Minutes of Exercise per Session:   Stress:   . Feeling of Stress :   Social Connections:   . Frequency of Communication with Friends and Family:   . Frequency of Social Gatherings with Friends and Family:   . Attends Religious Services:   . Active Member of Clubs or Organizations:   . Attends Archivist Meetings:   Marland Kitchen Marital Status:     Allergies: No Known Allergies  Metabolic Disorder Labs: No results found for: HGBA1C, MPG No results found for: PROLACTIN No results found for:  CHOL, TRIG, HDL, CHOLHDL, VLDL, LDLCALC No results found for: TSH  Therapeutic Level Labs: No results found for: LITHIUM No results found for: VALPROATE No components found for:  CBMZ  Current Medications: Current Outpatient Medications  Medication Sig Dispense Refill  . albuterol (PROVENTIL HFA;VENTOLIN HFA) 108 (90 Base) MCG/ACT inhaler Inhale 1-2 puffs into the lungs daily as needed.    Marland Kitchen aspirin 325 MG tablet Take 325 mg by mouth daily.     . Butalbital-APAP-Caffeine 50-325-40 MG capsule Take 1 capsule by mouth daily as needed.    . carbamazepine (EQUETRO) 200 MG CP12 12 hr capsule 1  bid 60 capsule 5  . doxycycline (VIBRA-TABS) 100 MG tablet Take 100 mg by mouth 2 (two) times daily.    Marland Kitchen lidocaine (XYLOCAINE) 2 % solution Use as directed 15 mLs in the mouth or throat as needed for mouth pain. 100 mL 2  .  lisinopril (PRINIVIL,ZESTRIL) 20 MG tablet Take 2 tablets (40 mg total) by mouth daily. 90 tablet 1  . traZODone (DESYREL) 100 MG tablet 1  qhs  After 3 days if not sleeping 2 qhs 60 tablet 4   No current facility-administered medications for this visit.    Musculoskeletal: Strength & Muscle Tone: within normal limits Gait & Station: normal Patient leans: N/A  Psychiatric Specialty Exam: ROS  There were no vitals taken for this visit.There is no height or weight on file to calculate BMI.  General Appearance: Casual and Disheveled  Eye Contact:  Fair  Speech:  Clear and Coherent and Normal Rate  Volume:  Normal  Mood:  Less irritable  Affect:  Appropriate and Congruent  Thought Process:  Goal Directed and Descriptions of Associations: Intact  Orientation:  Full (Time, Place, and Person)  Thought Content: Logical and Focused on Xanax   Suicidal Thoughts:  No  Homicidal Thoughts:  No  Memory:  Immediate;   Poor  Judgement:  Fair  Insight:  Present, Shallow and Improving  Psychomotor Activity:  Normal  Concentration:  Concentration: Fair  Recall:  [employer] of Knowledge: Fair  Language: Fair  Akathisia:  Negative  Handed:  Right  AIMS (if indicated): not done  Assets:  Communication Skills Desire for Improvement  ADL's:  Intact  Cognition: WNL  Sleep:  Fair   Screenings: PHQ2-9     Patient Outreach Telephone from 03/12/2017 in [place]  PHQ-2 Total Score  2  PHQ-9 Total Score  4       Assessment and Plan   This patient is #1 problem is that of mild neurocognitive disorder.  This is vascular dementia.  As result he has irritability that affects his day-to-day life.  At this time he has not shown any response to carbamazepine at 100 mg twice daily.  Therefore increase it to 200 mg twice daily and in 1 month we will get some blood work to determine the level and other blood work.  Patient again is sleeping and eating well he is not psychotic and he  denies the use of alcohol or drugs.  Will be seen again in about 2-1/2 months.  Status of current problems: new to [employer] Ordered: No orders of the defined types were placed in this encounter.   Labs Reviewed: na  Collateral Obtained/Records Reviewed: Wife is present and able to corroborate episodic agitation and explosive behaviors when he is angry  Plan:    Equetro 100 mg twice daily.  The patient return to see [name] in  2-1/2 months and shared how often he is been explosive.  At that time we will get blood work including a comprehensive metabolic panel.  Jerral Ralph, MD 10/17/2019, 9:43 AM

## 2019-10-20 ENCOUNTER — Other Ambulatory Visit (HOSPITAL_COMMUNITY): Payer: Self-pay | Admitting: *Deleted

## 2019-10-20 ENCOUNTER — Telehealth (HOSPITAL_COMMUNITY): Payer: Self-pay | Admitting: *Deleted

## 2019-10-20 DIAGNOSIS — Z79899 Other long term (current) drug therapy: Secondary | ICD-10-CM

## 2019-10-20 NOTE — Telephone Encounter (Signed)
Writer called and spoke to office [facility], [name] PA-C, who is pt's pCP regarding recent labs. Writer was told there have been no labs fo pt in over a year. Writer placed and sent orders for CBC w/diff, CMP, and a Tegretol level. Writer spoke with pt and instructed him to go to [facility] in [place]. Pt verbalizes understanding.

## 2019-11-12 DIAGNOSIS — Z20822 Contact with and (suspected) exposure to covid-19: Secondary | ICD-10-CM | POA: Diagnosis not present

## 2019-11-12 DIAGNOSIS — J449 Chronic obstructive pulmonary disease, unspecified: Secondary | ICD-10-CM | POA: Diagnosis not present

## 2019-11-14 ENCOUNTER — Telehealth (HOSPITAL_COMMUNITY): Payer: Self-pay

## 2019-11-14 NOTE — Telephone Encounter (Signed)
Received a fax from the pharmacy regarding patient's Carbamazepine [place]) 200mg . Spoke with the pharmacy and they stated that this medication is Non-Formulary and his insurance is no longer covering it. They're asking for an alternative. What would you like to do? Please review and advise. Thank you.

## 2019-11-20 ENCOUNTER — Other Ambulatory Visit (HOSPITAL_COMMUNITY): Payer: Self-pay

## 2019-11-20 MED ORDER — CARBAMAZEPINE ER 200 MG PO TB12: 200.0000 mg | ORAL_TABLET | Freq: Two times a day (BID) | ORAL | 1 refills | Status: DC

## 2019-11-20 NOTE — Telephone Encounter (Signed)
Done

## 2019-12-26 ENCOUNTER — Other Ambulatory Visit: Payer: Self-pay

## 2019-12-26 ENCOUNTER — Telehealth (INDEPENDENT_AMBULATORY_CARE_PROVIDER_SITE_OTHER): Payer: Medicare HMO | Admitting: Psychiatry

## 2019-12-26 DIAGNOSIS — G3184 Mild cognitive impairment, so stated: Secondary | ICD-10-CM

## 2019-12-26 DIAGNOSIS — S069X9S Unspecified intracranial injury with loss of consciousness of unspecified duration, sequela: Secondary | ICD-10-CM | POA: Diagnosis not present

## 2019-12-26 DIAGNOSIS — IMO0001 Reserved for inherently not codable concepts without codable children: Secondary | ICD-10-CM

## 2019-12-26 MED ORDER — CARBAMAZEPINE ER 100 MG PO TB12: ORAL_TABLET | ORAL | 3 refills | Status: DC

## 2019-12-26 MED ORDER — TRAZODONE HCL 100 MG PO TABS: ORAL_TABLET | ORAL | 4 refills | Status: DC

## 2019-12-26 NOTE — Progress Notes (Signed)
BH MD/PA/NP OP Progress Note  12/26/2019 10:51 AM Roy Koch  MRN:  [id number]  Chief Complaint: Irritability  HPI:   Visit Diagnosis: Mild neurocognitive disorder  Today the patient seems to be doing pretty well.  He himself admits that he is less irritable.  His wife agrees.  Actually it is his live-in girlfriend and is going to marry her tomorrow.  Today we spoke to the patient and to his fiance.  She says that he is actually doing better.  Unfortunately they never got any blood levels.  I am concerned about what medicines they are taking and what dose.  The patient reads the bottle and says he is taking 100 mg twice a day.  Her notes that he is taking 200 mg twice a day.  At this time we will clarify that he is going to take 100 mg twice daily and that they will in fact get blood work done in the next few weeks.  In the next few months the patient will return to see me in person.  The patient is not depressed.  He is sleeping and eating fairly well.  He drinks no alcohol uses no drugs and shows no overt evidence of psychosis.  He will continue taking Tegretol.  Will take 100 mg twice daily and he will continue trazodone for sleep.  Past Psychiatric History: See intake H&P for full details. Reviewed, with no updates at this time.   Past Medical History:  Past Medical History:  Diagnosis Date  . Anxiety   . Bipolar disorder (Roy Koch)   . Cardiac abnormality   . Headache   . Stroke [facility])     Past Surgical History:  Procedure Laterality Date  . AMPUTATION FINGER / THUMB    . CARDIAC SURGERY  at 44 years old    Family Psychiatric History: See intake H&P for full details. Reviewed, with no updates at this time.   Family History:  Family History  Problem Relation Age of Onset  . Schizophrenia Father   . Bipolar disorder Maternal Uncle   . Bipolar disorder Maternal Grandmother     Social History:  Social History   Socioeconomic History  . Marital status: Divorced    Spouse  name: Not on file  . Number of children: 2  . Years of education: Not on file  . Highest education level: 11th grade  Occupational History  . Not on file  Tobacco Use  . Smoking status: Current Every Day Smoker    Packs/day: 2.00    Types: Cigarettes  . Smokeless tobacco: Never Used  Vaping Use  . Vaping Use: Never used  Substance and Sexual Activity  . Alcohol use: No  . Drug use: No  . Sexual activity: Not on file  Other Topics Concern  . Not on file  Social History Narrative  . Not on file   Social Determinants of Health   Financial Resource Strain:   . Difficulty of Paying Living Expenses:   Food Insecurity:   . Worried About Charity fundraiser in the Last Year:   . Arboriculturist in the Last Year:   Transportation Needs:   . Film/video editor (Medical):   Marland Kitchen Lack of Transportation (Non-Medical):   Physical Activity:   . Days of Exercise per Week:   . Minutes of Exercise per Session:   Stress:   . Feeling of Stress :   Social Connections:   . Frequency of Communication with Friends  and Family:   . Frequency of Social Gatherings with Friends and Family:   . Attends Religious Services:   . Active Member of Clubs or Organizations:   . Attends Archivist Meetings:   Marland Kitchen Marital Status:     Allergies: No Known Allergies  Metabolic Disorder Labs: No results found for: HGBA1C, MPG No results found for: PROLACTIN No results found for: CHOL, TRIG, HDL, CHOLHDL, VLDL, LDLCALC No results found for: TSH  Therapeutic Level Labs: No results found for: LITHIUM No results found for: VALPROATE No components found for:  CBMZ  Current Medications: Current Outpatient Medications  Medication Sig Dispense Refill  . albuterol (PROVENTIL HFA;VENTOLIN HFA) 108 (90 Base) MCG/ACT inhaler Inhale 1-2 puffs into the lungs daily as needed.    Marland Kitchen aspirin 325 MG tablet Take 325 mg by mouth daily.     . Butalbital-APAP-Caffeine 50-325-40 MG capsule Take 1 capsule by  mouth daily as needed.    . carbamazepine (TEGRETOL XR) 100 MG 12 hr tablet 1  bid 60 tablet 3  . doxycycline (VIBRA-TABS) 100 MG tablet Take 100 mg by mouth 2 (two) times daily.    Marland Kitchen lidocaine (XYLOCAINE) 2 % solution Use as directed 15 mLs in the mouth or throat as needed for mouth pain. 100 mL 2  . lisinopril (PRINIVIL,ZESTRIL) 20 MG tablet Take 2 tablets (40 mg total) by mouth daily. 90 tablet 1  . traZODone (DESYREL) 100 MG tablet 1  qhs  After 3 days if not sleeping 2 qhs 60 tablet 4   No current facility-administered medications for this visit.    Musculoskeletal: Strength & Muscle Tone: within normal limits Gait & Station: normal Patient leans: N/A  Psychiatric Specialty Exam: ROS  There were no vitals taken for this visit.There is no height or weight on file to calculate BMI.  General Appearance: Casual and Disheveled  Eye Contact:  Fair  Speech:  Clear and Coherent and Normal Rate  Volume:  Normal  Mood:  Less irritable  Affect:  Appropriate and Congruent  Thought Process:  Goal Directed and Descriptions of Associations: Intact  Orientation:  Full (Time, Place, and Person)  Thought Content: Logical and Focused on Xanax   Suicidal Thoughts:  No  Homicidal Thoughts:  No  Memory:  Immediate;   Poor  Judgement:  Fair  Insight:  Present, Shallow and Improving  Psychomotor Activity:  Normal  Concentration:  Concentration: Fair  Recall:  [employer] of Knowledge: Fair  Language: Fair  Akathisia:  Negative  Handed:  Right  AIMS (if indicated): not done  Assets:  Communication Skills Desire for Improvement  ADL's:  Intact  Cognition: WNL  Sleep:  Fair   Screenings: PHQ2-9     Patient Outreach Telephone from 03/12/2017 in [place]  PHQ-2 Total Score 2  PHQ-9 Total Score 4       Assessment and Plan   This patient is #1 problem is that of vascular dementia.  He has associated irritability and takes Tegretol.  It is not clear how much she is  taking.  At this time will be conservative and given 100 mg twice daily in addition to get some blood work done in the next few weeks.  His second problem is that of insomnia.  He will continue taking trazodone for that and works pretty well.  Patient is functioning fairly well.  He is stable. Status of current problems: new to [employer] Ordered: No orders of the defined types  were placed in this encounter.   Labs Reviewed: na  Collateral Obtained/Records Reviewed: Wife is present and able to corroborate episodic agitation and explosive behaviors when he is angry  Plan:    Equetro 100 mg twice daily.  The patient return to see [name] in 2-1/2 months and shared how often he is been explosive.  At that time we will get blood work including a comprehensive metabolic panel.  Jerral Ralph, MD 12/26/2019, 10:51 AM

## 2020-01-14 DIAGNOSIS — J449 Chronic obstructive pulmonary disease, unspecified: Secondary | ICD-10-CM | POA: Diagnosis not present

## 2020-01-14 DIAGNOSIS — R002 Palpitations: Secondary | ICD-10-CM | POA: Diagnosis not present

## 2020-01-14 DIAGNOSIS — Z6832 Body mass index (BMI) 32.0-32.9, adult: Secondary | ICD-10-CM | POA: Diagnosis not present

## 2020-01-14 DIAGNOSIS — M549 Dorsalgia, unspecified: Secondary | ICD-10-CM | POA: Diagnosis not present

## 2020-01-19 DIAGNOSIS — M9905 Segmental and somatic dysfunction of pelvic region: Secondary | ICD-10-CM | POA: Diagnosis not present

## 2020-01-19 DIAGNOSIS — M9902 Segmental and somatic dysfunction of thoracic region: Secondary | ICD-10-CM | POA: Diagnosis not present

## 2020-01-19 DIAGNOSIS — R519 Headache, unspecified: Secondary | ICD-10-CM | POA: Diagnosis not present

## 2020-01-19 DIAGNOSIS — M9904 Segmental and somatic dysfunction of sacral region: Secondary | ICD-10-CM | POA: Diagnosis not present

## 2020-01-19 DIAGNOSIS — M50322 Other cervical disc degeneration at C5-C6 level: Secondary | ICD-10-CM | POA: Diagnosis not present

## 2020-01-19 DIAGNOSIS — M9901 Segmental and somatic dysfunction of cervical region: Secondary | ICD-10-CM | POA: Diagnosis not present

## 2020-01-19 DIAGNOSIS — Q72892 Other reduction defects of left lower limb: Secondary | ICD-10-CM | POA: Diagnosis not present

## 2020-01-19 DIAGNOSIS — M5137 Other intervertebral disc degeneration, lumbosacral region: Secondary | ICD-10-CM | POA: Diagnosis not present

## 2020-01-19 DIAGNOSIS — M9903 Segmental and somatic dysfunction of lumbar region: Secondary | ICD-10-CM | POA: Diagnosis not present

## 2020-01-20 DIAGNOSIS — Q72892 Other reduction defects of left lower limb: Secondary | ICD-10-CM | POA: Diagnosis not present

## 2020-01-20 DIAGNOSIS — M5137 Other intervertebral disc degeneration, lumbosacral region: Secondary | ICD-10-CM | POA: Diagnosis not present

## 2020-01-20 DIAGNOSIS — M9901 Segmental and somatic dysfunction of cervical region: Secondary | ICD-10-CM | POA: Diagnosis not present

## 2020-01-20 DIAGNOSIS — M9903 Segmental and somatic dysfunction of lumbar region: Secondary | ICD-10-CM | POA: Diagnosis not present

## 2020-01-20 DIAGNOSIS — M9902 Segmental and somatic dysfunction of thoracic region: Secondary | ICD-10-CM | POA: Diagnosis not present

## 2020-01-20 DIAGNOSIS — M9904 Segmental and somatic dysfunction of sacral region: Secondary | ICD-10-CM | POA: Diagnosis not present

## 2020-01-20 DIAGNOSIS — M50322 Other cervical disc degeneration at C5-C6 level: Secondary | ICD-10-CM | POA: Diagnosis not present

## 2020-01-20 DIAGNOSIS — R519 Headache, unspecified: Secondary | ICD-10-CM | POA: Diagnosis not present

## 2020-01-20 DIAGNOSIS — M9905 Segmental and somatic dysfunction of pelvic region: Secondary | ICD-10-CM | POA: Diagnosis not present

## 2020-01-22 DIAGNOSIS — M9901 Segmental and somatic dysfunction of cervical region: Secondary | ICD-10-CM | POA: Diagnosis not present

## 2020-01-22 DIAGNOSIS — M9903 Segmental and somatic dysfunction of lumbar region: Secondary | ICD-10-CM | POA: Diagnosis not present

## 2020-01-22 DIAGNOSIS — M5137 Other intervertebral disc degeneration, lumbosacral region: Secondary | ICD-10-CM | POA: Diagnosis not present

## 2020-01-22 DIAGNOSIS — M50322 Other cervical disc degeneration at C5-C6 level: Secondary | ICD-10-CM | POA: Diagnosis not present

## 2020-01-22 DIAGNOSIS — Q72892 Other reduction defects of left lower limb: Secondary | ICD-10-CM | POA: Diagnosis not present

## 2020-01-22 DIAGNOSIS — M9902 Segmental and somatic dysfunction of thoracic region: Secondary | ICD-10-CM | POA: Diagnosis not present

## 2020-01-22 DIAGNOSIS — M9904 Segmental and somatic dysfunction of sacral region: Secondary | ICD-10-CM | POA: Diagnosis not present

## 2020-01-22 DIAGNOSIS — M9905 Segmental and somatic dysfunction of pelvic region: Secondary | ICD-10-CM | POA: Diagnosis not present

## 2020-01-22 DIAGNOSIS — R519 Headache, unspecified: Secondary | ICD-10-CM | POA: Diagnosis not present

## 2020-01-26 DIAGNOSIS — M50322 Other cervical disc degeneration at C5-C6 level: Secondary | ICD-10-CM | POA: Diagnosis not present

## 2020-01-26 DIAGNOSIS — M9901 Segmental and somatic dysfunction of cervical region: Secondary | ICD-10-CM | POA: Diagnosis not present

## 2020-01-26 DIAGNOSIS — M9903 Segmental and somatic dysfunction of lumbar region: Secondary | ICD-10-CM | POA: Diagnosis not present

## 2020-01-26 DIAGNOSIS — M9904 Segmental and somatic dysfunction of sacral region: Secondary | ICD-10-CM | POA: Diagnosis not present

## 2020-01-26 DIAGNOSIS — R519 Headache, unspecified: Secondary | ICD-10-CM | POA: Diagnosis not present

## 2020-01-26 DIAGNOSIS — M5137 Other intervertebral disc degeneration, lumbosacral region: Secondary | ICD-10-CM | POA: Diagnosis not present

## 2020-01-26 DIAGNOSIS — Q72892 Other reduction defects of left lower limb: Secondary | ICD-10-CM | POA: Diagnosis not present

## 2020-01-26 DIAGNOSIS — M9902 Segmental and somatic dysfunction of thoracic region: Secondary | ICD-10-CM | POA: Diagnosis not present

## 2020-01-26 DIAGNOSIS — M9905 Segmental and somatic dysfunction of pelvic region: Secondary | ICD-10-CM | POA: Diagnosis not present

## 2020-01-27 DIAGNOSIS — Q72892 Other reduction defects of left lower limb: Secondary | ICD-10-CM | POA: Diagnosis not present

## 2020-01-27 DIAGNOSIS — M9904 Segmental and somatic dysfunction of sacral region: Secondary | ICD-10-CM | POA: Diagnosis not present

## 2020-01-27 DIAGNOSIS — M50322 Other cervical disc degeneration at C5-C6 level: Secondary | ICD-10-CM | POA: Diagnosis not present

## 2020-01-27 DIAGNOSIS — M9903 Segmental and somatic dysfunction of lumbar region: Secondary | ICD-10-CM | POA: Diagnosis not present

## 2020-01-27 DIAGNOSIS — M5137 Other intervertebral disc degeneration, lumbosacral region: Secondary | ICD-10-CM | POA: Diagnosis not present

## 2020-01-27 DIAGNOSIS — M9902 Segmental and somatic dysfunction of thoracic region: Secondary | ICD-10-CM | POA: Diagnosis not present

## 2020-01-27 DIAGNOSIS — M9905 Segmental and somatic dysfunction of pelvic region: Secondary | ICD-10-CM | POA: Diagnosis not present

## 2020-01-27 DIAGNOSIS — M9901 Segmental and somatic dysfunction of cervical region: Secondary | ICD-10-CM | POA: Diagnosis not present

## 2020-01-27 DIAGNOSIS — R519 Headache, unspecified: Secondary | ICD-10-CM | POA: Diagnosis not present

## 2020-02-03 DIAGNOSIS — Q72892 Other reduction defects of left lower limb: Secondary | ICD-10-CM | POA: Diagnosis not present

## 2020-02-03 DIAGNOSIS — M5137 Other intervertebral disc degeneration, lumbosacral region: Secondary | ICD-10-CM | POA: Diagnosis not present

## 2020-02-03 DIAGNOSIS — M9905 Segmental and somatic dysfunction of pelvic region: Secondary | ICD-10-CM | POA: Diagnosis not present

## 2020-02-03 DIAGNOSIS — M9904 Segmental and somatic dysfunction of sacral region: Secondary | ICD-10-CM | POA: Diagnosis not present

## 2020-02-03 DIAGNOSIS — R519 Headache, unspecified: Secondary | ICD-10-CM | POA: Diagnosis not present

## 2020-02-03 DIAGNOSIS — M9902 Segmental and somatic dysfunction of thoracic region: Secondary | ICD-10-CM | POA: Diagnosis not present

## 2020-02-03 DIAGNOSIS — M9901 Segmental and somatic dysfunction of cervical region: Secondary | ICD-10-CM | POA: Diagnosis not present

## 2020-02-03 DIAGNOSIS — M9903 Segmental and somatic dysfunction of lumbar region: Secondary | ICD-10-CM | POA: Diagnosis not present

## 2020-02-03 DIAGNOSIS — M50322 Other cervical disc degeneration at C5-C6 level: Secondary | ICD-10-CM | POA: Diagnosis not present

## 2020-02-04 DIAGNOSIS — M9902 Segmental and somatic dysfunction of thoracic region: Secondary | ICD-10-CM | POA: Diagnosis not present

## 2020-02-04 DIAGNOSIS — M50322 Other cervical disc degeneration at C5-C6 level: Secondary | ICD-10-CM | POA: Diagnosis not present

## 2020-02-04 DIAGNOSIS — M9904 Segmental and somatic dysfunction of sacral region: Secondary | ICD-10-CM | POA: Diagnosis not present

## 2020-02-04 DIAGNOSIS — M5137 Other intervertebral disc degeneration, lumbosacral region: Secondary | ICD-10-CM | POA: Diagnosis not present

## 2020-02-04 DIAGNOSIS — M9905 Segmental and somatic dysfunction of pelvic region: Secondary | ICD-10-CM | POA: Diagnosis not present

## 2020-02-04 DIAGNOSIS — R519 Headache, unspecified: Secondary | ICD-10-CM | POA: Diagnosis not present

## 2020-02-04 DIAGNOSIS — Q72892 Other reduction defects of left lower limb: Secondary | ICD-10-CM | POA: Diagnosis not present

## 2020-02-04 DIAGNOSIS — M9903 Segmental and somatic dysfunction of lumbar region: Secondary | ICD-10-CM | POA: Diagnosis not present

## 2020-02-04 DIAGNOSIS — M9901 Segmental and somatic dysfunction of cervical region: Secondary | ICD-10-CM | POA: Diagnosis not present

## 2020-02-09 DIAGNOSIS — M9905 Segmental and somatic dysfunction of pelvic region: Secondary | ICD-10-CM | POA: Diagnosis not present

## 2020-02-09 DIAGNOSIS — M9902 Segmental and somatic dysfunction of thoracic region: Secondary | ICD-10-CM | POA: Diagnosis not present

## 2020-02-09 DIAGNOSIS — Q72892 Other reduction defects of left lower limb: Secondary | ICD-10-CM | POA: Diagnosis not present

## 2020-02-09 DIAGNOSIS — R519 Headache, unspecified: Secondary | ICD-10-CM | POA: Diagnosis not present

## 2020-02-09 DIAGNOSIS — M9903 Segmental and somatic dysfunction of lumbar region: Secondary | ICD-10-CM | POA: Diagnosis not present

## 2020-02-09 DIAGNOSIS — M5137 Other intervertebral disc degeneration, lumbosacral region: Secondary | ICD-10-CM | POA: Diagnosis not present

## 2020-02-09 DIAGNOSIS — M9901 Segmental and somatic dysfunction of cervical region: Secondary | ICD-10-CM | POA: Diagnosis not present

## 2020-02-09 DIAGNOSIS — M9904 Segmental and somatic dysfunction of sacral region: Secondary | ICD-10-CM | POA: Diagnosis not present

## 2020-02-09 DIAGNOSIS — M50322 Other cervical disc degeneration at C5-C6 level: Secondary | ICD-10-CM | POA: Diagnosis not present

## 2020-02-10 DIAGNOSIS — M9901 Segmental and somatic dysfunction of cervical region: Secondary | ICD-10-CM | POA: Diagnosis not present

## 2020-02-10 DIAGNOSIS — Q72892 Other reduction defects of left lower limb: Secondary | ICD-10-CM | POA: Diagnosis not present

## 2020-02-10 DIAGNOSIS — M9903 Segmental and somatic dysfunction of lumbar region: Secondary | ICD-10-CM | POA: Diagnosis not present

## 2020-02-10 DIAGNOSIS — M50322 Other cervical disc degeneration at C5-C6 level: Secondary | ICD-10-CM | POA: Diagnosis not present

## 2020-02-10 DIAGNOSIS — M9905 Segmental and somatic dysfunction of pelvic region: Secondary | ICD-10-CM | POA: Diagnosis not present

## 2020-02-10 DIAGNOSIS — M5137 Other intervertebral disc degeneration, lumbosacral region: Secondary | ICD-10-CM | POA: Diagnosis not present

## 2020-02-10 DIAGNOSIS — M9902 Segmental and somatic dysfunction of thoracic region: Secondary | ICD-10-CM | POA: Diagnosis not present

## 2020-02-10 DIAGNOSIS — R519 Headache, unspecified: Secondary | ICD-10-CM | POA: Diagnosis not present

## 2020-02-10 DIAGNOSIS — M9904 Segmental and somatic dysfunction of sacral region: Secondary | ICD-10-CM | POA: Diagnosis not present

## 2020-02-12 DIAGNOSIS — M9905 Segmental and somatic dysfunction of pelvic region: Secondary | ICD-10-CM | POA: Diagnosis not present

## 2020-02-12 DIAGNOSIS — M9902 Segmental and somatic dysfunction of thoracic region: Secondary | ICD-10-CM | POA: Diagnosis not present

## 2020-02-12 DIAGNOSIS — M50322 Other cervical disc degeneration at C5-C6 level: Secondary | ICD-10-CM | POA: Diagnosis not present

## 2020-02-12 DIAGNOSIS — R519 Headache, unspecified: Secondary | ICD-10-CM | POA: Diagnosis not present

## 2020-02-12 DIAGNOSIS — M5137 Other intervertebral disc degeneration, lumbosacral region: Secondary | ICD-10-CM | POA: Diagnosis not present

## 2020-02-12 DIAGNOSIS — M9904 Segmental and somatic dysfunction of sacral region: Secondary | ICD-10-CM | POA: Diagnosis not present

## 2020-02-12 DIAGNOSIS — Q72892 Other reduction defects of left lower limb: Secondary | ICD-10-CM | POA: Diagnosis not present

## 2020-02-12 DIAGNOSIS — M9903 Segmental and somatic dysfunction of lumbar region: Secondary | ICD-10-CM | POA: Diagnosis not present

## 2020-02-12 DIAGNOSIS — M9901 Segmental and somatic dysfunction of cervical region: Secondary | ICD-10-CM | POA: Diagnosis not present

## 2020-02-16 DIAGNOSIS — Z20822 Contact with and (suspected) exposure to covid-19: Secondary | ICD-10-CM | POA: Diagnosis not present

## 2020-02-25 DIAGNOSIS — M9904 Segmental and somatic dysfunction of sacral region: Secondary | ICD-10-CM | POA: Diagnosis not present

## 2020-02-25 DIAGNOSIS — M5137 Other intervertebral disc degeneration, lumbosacral region: Secondary | ICD-10-CM | POA: Diagnosis not present

## 2020-02-25 DIAGNOSIS — M9903 Segmental and somatic dysfunction of lumbar region: Secondary | ICD-10-CM | POA: Diagnosis not present

## 2020-02-25 DIAGNOSIS — M9902 Segmental and somatic dysfunction of thoracic region: Secondary | ICD-10-CM | POA: Diagnosis not present

## 2020-02-25 DIAGNOSIS — M50322 Other cervical disc degeneration at C5-C6 level: Secondary | ICD-10-CM | POA: Diagnosis not present

## 2020-02-25 DIAGNOSIS — R519 Headache, unspecified: Secondary | ICD-10-CM | POA: Diagnosis not present

## 2020-02-25 DIAGNOSIS — Q72892 Other reduction defects of left lower limb: Secondary | ICD-10-CM | POA: Diagnosis not present

## 2020-02-25 DIAGNOSIS — M9905 Segmental and somatic dysfunction of pelvic region: Secondary | ICD-10-CM | POA: Diagnosis not present

## 2020-02-25 DIAGNOSIS — M9901 Segmental and somatic dysfunction of cervical region: Secondary | ICD-10-CM | POA: Diagnosis not present

## 2020-02-26 DIAGNOSIS — M9904 Segmental and somatic dysfunction of sacral region: Secondary | ICD-10-CM | POA: Diagnosis not present

## 2020-02-26 DIAGNOSIS — R519 Headache, unspecified: Secondary | ICD-10-CM | POA: Diagnosis not present

## 2020-02-26 DIAGNOSIS — M9902 Segmental and somatic dysfunction of thoracic region: Secondary | ICD-10-CM | POA: Diagnosis not present

## 2020-02-26 DIAGNOSIS — M9903 Segmental and somatic dysfunction of lumbar region: Secondary | ICD-10-CM | POA: Diagnosis not present

## 2020-02-26 DIAGNOSIS — M9905 Segmental and somatic dysfunction of pelvic region: Secondary | ICD-10-CM | POA: Diagnosis not present

## 2020-02-26 DIAGNOSIS — M9901 Segmental and somatic dysfunction of cervical region: Secondary | ICD-10-CM | POA: Diagnosis not present

## 2020-02-26 DIAGNOSIS — M5137 Other intervertebral disc degeneration, lumbosacral region: Secondary | ICD-10-CM | POA: Diagnosis not present

## 2020-02-26 DIAGNOSIS — Q72892 Other reduction defects of left lower limb: Secondary | ICD-10-CM | POA: Diagnosis not present

## 2020-02-26 DIAGNOSIS — M50322 Other cervical disc degeneration at C5-C6 level: Secondary | ICD-10-CM | POA: Diagnosis not present

## 2020-03-02 DIAGNOSIS — Q72892 Other reduction defects of left lower limb: Secondary | ICD-10-CM | POA: Diagnosis not present

## 2020-03-02 DIAGNOSIS — M50322 Other cervical disc degeneration at C5-C6 level: Secondary | ICD-10-CM | POA: Diagnosis not present

## 2020-03-02 DIAGNOSIS — M9902 Segmental and somatic dysfunction of thoracic region: Secondary | ICD-10-CM | POA: Diagnosis not present

## 2020-03-02 DIAGNOSIS — M9903 Segmental and somatic dysfunction of lumbar region: Secondary | ICD-10-CM | POA: Diagnosis not present

## 2020-03-02 DIAGNOSIS — M5137 Other intervertebral disc degeneration, lumbosacral region: Secondary | ICD-10-CM | POA: Diagnosis not present

## 2020-03-02 DIAGNOSIS — M9905 Segmental and somatic dysfunction of pelvic region: Secondary | ICD-10-CM | POA: Diagnosis not present

## 2020-03-02 DIAGNOSIS — M9904 Segmental and somatic dysfunction of sacral region: Secondary | ICD-10-CM | POA: Diagnosis not present

## 2020-03-02 DIAGNOSIS — R519 Headache, unspecified: Secondary | ICD-10-CM | POA: Diagnosis not present

## 2020-03-02 DIAGNOSIS — M9901 Segmental and somatic dysfunction of cervical region: Secondary | ICD-10-CM | POA: Diagnosis not present

## 2020-03-08 DIAGNOSIS — I10 Essential (primary) hypertension: Secondary | ICD-10-CM | POA: Diagnosis not present

## 2020-03-08 DIAGNOSIS — J449 Chronic obstructive pulmonary disease, unspecified: Secondary | ICD-10-CM | POA: Diagnosis not present

## 2020-03-08 DIAGNOSIS — Z6832 Body mass index (BMI) 32.0-32.9, adult: Secondary | ICD-10-CM | POA: Diagnosis not present

## 2020-03-08 DIAGNOSIS — Z1331 Encounter for screening for depression: Secondary | ICD-10-CM | POA: Diagnosis not present

## 2020-03-08 DIAGNOSIS — R002 Palpitations: Secondary | ICD-10-CM | POA: Diagnosis not present

## 2020-03-10 ENCOUNTER — Other Ambulatory Visit: Payer: Self-pay

## 2020-03-10 DIAGNOSIS — I639 Cerebral infarction, unspecified: Secondary | ICD-10-CM | POA: Insufficient documentation

## 2020-03-10 DIAGNOSIS — R519 Headache, unspecified: Secondary | ICD-10-CM | POA: Insufficient documentation

## 2020-03-10 DIAGNOSIS — F319 Bipolar disorder, unspecified: Secondary | ICD-10-CM | POA: Insufficient documentation

## 2020-03-10 DIAGNOSIS — G43909 Migraine, unspecified, not intractable, without status migrainosus: Secondary | ICD-10-CM | POA: Insufficient documentation

## 2020-03-10 DIAGNOSIS — Z87891 Personal history of nicotine dependence: Secondary | ICD-10-CM | POA: Insufficient documentation

## 2020-03-10 DIAGNOSIS — Q210 Ventricular septal defect: Secondary | ICD-10-CM | POA: Insufficient documentation

## 2020-03-10 DIAGNOSIS — R002 Palpitations: Secondary | ICD-10-CM | POA: Insufficient documentation

## 2020-03-10 DIAGNOSIS — Z8673 Personal history of transient ischemic attack (TIA), and cerebral infarction without residual deficits: Secondary | ICD-10-CM | POA: Insufficient documentation

## 2020-03-10 DIAGNOSIS — I10 Essential (primary) hypertension: Secondary | ICD-10-CM | POA: Insufficient documentation

## 2020-03-10 DIAGNOSIS — Z8616 Personal history of COVID-19: Secondary | ICD-10-CM | POA: Insufficient documentation

## 2020-03-10 DIAGNOSIS — J449 Chronic obstructive pulmonary disease, unspecified: Secondary | ICD-10-CM | POA: Insufficient documentation

## 2020-03-10 DIAGNOSIS — Q249 Congenital malformation of heart, unspecified: Secondary | ICD-10-CM | POA: Insufficient documentation

## 2020-03-10 DIAGNOSIS — F419 Anxiety disorder, unspecified: Secondary | ICD-10-CM | POA: Insufficient documentation

## 2020-03-10 DIAGNOSIS — M542 Cervicalgia: Secondary | ICD-10-CM | POA: Insufficient documentation

## 2020-03-10 DIAGNOSIS — Z720 Tobacco use: Secondary | ICD-10-CM | POA: Insufficient documentation

## 2020-03-11 ENCOUNTER — Ambulatory Visit (INDEPENDENT_AMBULATORY_CARE_PROVIDER_SITE_OTHER): Payer: Medicare HMO

## 2020-03-11 ENCOUNTER — Other Ambulatory Visit: Payer: Self-pay

## 2020-03-11 ENCOUNTER — Encounter: Payer: Self-pay | Admitting: Cardiology

## 2020-03-11 ENCOUNTER — Ambulatory Visit: Payer: Medicare HMO | Admitting: Cardiology

## 2020-03-11 VITALS — BP 116/70 | HR 90 | Ht 72.0 in | Wt 230.4 lb

## 2020-03-11 DIAGNOSIS — I10 Essential (primary) hypertension: Secondary | ICD-10-CM | POA: Diagnosis not present

## 2020-03-11 DIAGNOSIS — R002 Palpitations: Secondary | ICD-10-CM | POA: Diagnosis not present

## 2020-03-11 DIAGNOSIS — Z87891 Personal history of nicotine dependence: Secondary | ICD-10-CM | POA: Diagnosis not present

## 2020-03-11 NOTE — Addendum Note (Signed)
Addended by: Birder Robson on: 03/11/2020 03:50 PM   Modules accepted: Orders

## 2020-03-11 NOTE — Progress Notes (Signed)
Cardiology Office Note:    Date:  03/11/2020   ID:  Roy Koch, DOB 02-18-1976, MRN [id number]  PCP:  [name], PA-C  Cardiologist:  Jenean Lindau, MD   Referring MD: [name], PA-C    ASSESSMENT:    1. Essential hypertension   2. History of tobacco use   3. Palpitations    PLAN:    In order of problems listed above:  1. I discussed my findings with the patient at length.  He has significant palpitations.  His recent TSH was unremarkable and I will therefore do a 2-week monitor.  I explained to him about the machine Cardia and that he could buy it on the Internet to monitor himself.  He said he will look into it.  He knows to go to the nearest emergency room concerning symptoms. 2. Essential hypertension: Blood pressure stable.  Diet and lifestyle modification was emphasized 3. Mixed dyslipidemia: I reviewed the KPN sheet and discussed lipids with him at extensive length.  Restratification with calcium CT score was recommended he is not keen on it at this time. 4. Cigarette smoker: He has a heavy smoking history and I counseled him against it.  He promises to do better.  Risks of smoking explained.  5 minutes of time was dedicated to smoking cessation counseling.  I discussed pharmacological intervention but is not keen on it at this time. 5. Patient will be seen in follow-up appointment in 3 months or earlier if the patient has any concerns    Medication Adjustments/Labs and Tests Ordered: Current medicines are reviewed at length with the patient today.  Concerns regarding medicines are outlined above.  No orders of the defined types were placed in this encounter.  No orders of the defined types were placed in this encounter.    No chief complaint on file.    History of Present Illness:    Roy Koch is a 44 y.o. male.  Patient has past medical history of palpitations, essential hypertension and heavy cigarette smoking up to 3 packs a day.  He denies  any problems except sporadically left palpitations.  After this he tells me that he feels drained.  No chest pain orthopnea or PND.  He leads a sedentary lifestyle.  Unfortunately continues to smoke.  At the time of my evaluation, the patient is alert awake oriented and in no distress.  Past Medical History:  Diagnosis Date  . Anxiety   . Bipolar disorder ([place])   . Bipolar disorder ([place])   . Cardiac abnormality   . Chest discomfort 06/18/2018  . Cigarette smoker 06/18/2018  . COPD (chronic obstructive pulmonary disease) ([place])   . Essential hypertension 06/18/2018  . Generalized anxiety disorder 09/15/2017  . Headache   . Heart defect, congenital   . History of COVID-19   . History of repair of congenital atrial septal defect (ASD) 06/18/2018  . History of TIA (transient ischemic attack)   . History of tobacco use   . Hypertension   . Hypertension, essential, benign   . Low back pain 07/14/2013  . Migraine headache   . Neck pain   . Palpitations   . Stroke ([place])   . Tobacco abuse   . Ventricular septal defect     Past Surgical History:  Procedure Laterality Date  . AMPUTATION FINGER / THUMB    . CARDIAC SURGERY  at 44 years old    Current Medications: Current Meds  Medication Sig  . albuterol (PROVENTIL  HFA;VENTOLIN HFA) 108 (90 Base) MCG/ACT inhaler Inhale 1-2 puffs into the lungs daily as needed.  Marland Kitchen aspirin 325 MG tablet Take 325 mg by mouth daily.   . Garlic Oil 329 MG TABS Take 250 mg by mouth daily.  . SYMBICORT 160-4.5 MCG/ACT inhaler Inhale 2 puffs into the lungs 2 (two) times daily.     Allergies:   Patient has no known allergies.   Social History   Socioeconomic History  . Marital status: Divorced    Spouse name: Not on file  . Number of children: 2  . Years of education: Not on file  . Highest education level: 11th grade  Occupational History  . Not on file  Tobacco Use  . Smoking status: Current Every Day Smoker    Packs/day: 3.00    Types: Cigarettes   . Smokeless tobacco: Never Used  Vaping Use  . Vaping Use: Never used  Substance and Sexual Activity  . Alcohol use: No  . Drug use: No  . Sexual activity: Not on file  Other Topics Concern  . Not on file  Social History Narrative  . Not on file   Social Determinants of Health   Financial Resource Strain:   . Difficulty of Paying Living Expenses: Not on file  Food Insecurity:   . Worried About Charity fundraiser in the Last Year: Not on file  . Ran Out of Food in the Last Year: Not on file  Transportation Needs:   . Lack of Transportation (Medical): Not on file  . Lack of Transportation (Non-Medical): Not on file  Physical Activity:   . Days of Exercise per Week: Not on file  . Minutes of Exercise per Session: Not on file  Stress:   . Feeling of Stress : Not on file  Social Connections:   . Frequency of Communication with Friends and Family: Not on file  . Frequency of Social Gatherings with Friends and Family: Not on file  . Attends Religious Services: Not on file  . Active Member of Clubs or Organizations: Not on file  . Attends Archivist Meetings: Not on file  . Marital Status: Not on file     Family History: The patient's family history includes Bipolar disorder in his maternal grandmother and maternal uncle; Schizophrenia in his father.  ROS:   Please see the history of present illness.    All other systems reviewed and are negative.  EKGs/Labs/Other Studies Reviewed:    The following studies were reviewed today: Study Highlights    Nuclear stress EF: 43%.  There was no ST segment deviation noted during stress.  Defect 1: There is a small defect of moderate severity present in the apical inferior and apex location.  This is a low risk study.  The left ventricular ejection fraction is moderately decreased (30-44%).   Abnormal low risk stress nuclear study with probable apical thinning but no ischemia; EF 43 with global hypokinesis;  moderate LVE.  IMPRESSIONS    1. The left ventricle has low normal systolic function of 92-42%. The  cavity size is mildly increased. There is no increased left ventricular  wall thickness. Echo evidence of normal diastolic relaxation.  2. Mild global hypokinesis. Global longitudinal strian -14.6% (mildly  abnormal).  3. The right ventricle has normal systolic function. The cavity in normal  in size. There is no increase in right ventricular wall thickness. Right  ventricular systolic pressure could not be assessed.  4. The mitral valve is  normal in structure.  5. The aortic valve is tricuspid.     Recent Labs: No results found for requested labs within last 8760 hours.  Recent Lipid Panel No results found for: CHOL, TRIG, HDL, CHOLHDL, VLDL, LDLCALC, LDLDIRECT  Physical Exam:    VS:  BP 116/70   Pulse 90   Ht 6' (1.829 m)   Wt 230 lb 6.4 oz (104.5 kg)   SpO2 94%   BMI 31.25 kg/m     Wt Readings from Last 3 Encounters:  03/11/20 230 lb 6.4 oz (104.5 kg)  08/31/19 230 lb (104.3 kg)  07/03/18 226 lb (102.5 kg)     GEN: Patient is in no acute distress HEENT: Normal NECK: No JVD; No carotid bruits LYMPHATICS: No lymphadenopathy CARDIAC: Hear sounds regular, 2/6 systolic murmur at the apex. RESPIRATORY:  Clear to auscultation without rales, wheezing or rhonchi  ABDOMEN: Soft, non-tender, non-distended MUSCULOSKELETAL:  No edema; No deformity  SKIN: Warm and dry NEUROLOGIC:  Alert and oriented x 3 PSYCHIATRIC:  Normal affect   Signed, Jenean Lindau, MD  03/11/2020 3:25 PM    [facility]

## 2020-03-11 NOTE — Patient Instructions (Signed)
Medication Instructions:  No medication changes. *If you need a refill on your cardiac medications before your next appointment, please call your pharmacy*   Lab Work: None ordered If you have labs (blood work) drawn today and your tests are completely normal, you will receive your results only by: Marland Kitchen MyChart Message (if you have MyChart) OR . A paper copy in the mail If you have any lab test that is abnormal or we need to change your treatment, we will call you to review the results.   Testing/Procedures:  WHY IS MY DOCTOR PRESCRIBING ZIO? The Zio system is proven and trusted by physicians to detect and diagnose irregular heart rhythms -- and has been prescribed to hundreds of thousands of patients.  The FDA has cleared the Zio system to monitor for many different kinds of irregular heart rhythms. In a study, physicians were able to reach a diagnosis 90% of the time with the Zio system1.  You can wear the Zio monitor -- a small, discreet, comfortable patch -- during your normal day-to-day activity, including while you sleep, shower, and exercise, while it records every single heartbeat for analysis.  1Barrett, P., et al. Comparison of 24 Hour Holter Monitoring Versus 14 Day Novel Adhesive Patch Electrocardiographic Monitoring. [place], 2014.  ZIO VS. HOLTER MONITORING The Zio monitor can be comfortably worn for up to 14 days. Holter monitors can be worn for 24 to 48 hours, limiting the time to record any irregular heart rhythms you may have. Zio is able to capture data for the 51% of patients who have their first symptom-triggered arrhythmia after 48 hours.1  LIVE WITHOUT RESTRICTIONS The Zio ambulatory cardiac monitor is a small, unobtrusive, and water-resistant patch--you might even forget you're wearing it. The Zio monitor records and stores every beat of your heart, whether you're sleeping, working out, or showering.  Wear the monitor for 2 weeks. Remove on  03/25/2020.  Follow-Up: At [facility], you and your health needs are our priority.  As part of our continuing mission to provide you with exceptional heart care, we have created designated Provider Care Teams.  These Care Teams include your primary Cardiologist (physician) and Advanced Practice Providers (APPs -  Physician Assistants and Nurse Practitioners) who all work together to provide you with the care you need, when you need it.  We recommend signing up for the patient portal called "MyChart".  Sign up information is provided on this After Visit Summary.  MyChart is used to connect with patients for Virtual Visits (Telemedicine).  Patients are able to view lab/test results, encounter notes, upcoming appointments, etc.  Non-urgent messages can be sent to your provider as well.   To learn more about what you can do with MyChart, go to [email or web address].    Your next appointment:   3 month(s)  The format for your next appointment:   In Person  Provider:   Jyl Heinz, MD   Other Instructions NA

## 2020-03-26 ENCOUNTER — Telehealth (INDEPENDENT_AMBULATORY_CARE_PROVIDER_SITE_OTHER): Payer: Medicare HMO | Admitting: Psychiatry

## 2020-03-26 ENCOUNTER — Other Ambulatory Visit: Payer: Self-pay

## 2020-03-26 DIAGNOSIS — F0670 Mild neurocognitive disorder due to known physiological condition without behavioral disturbance: Secondary | ICD-10-CM

## 2020-03-26 DIAGNOSIS — G3184 Mild cognitive impairment, so stated: Secondary | ICD-10-CM

## 2020-03-26 MED ORDER — DOXEPIN HCL 25 MG PO CAPS: 25.0000 mg | ORAL_CAPSULE | Freq: Every day | ORAL | 3 refills | Status: DC

## 2020-03-26 MED ORDER — CARBAMAZEPINE ER 100 MG PO TB12: 100.0000 mg | ORAL_TABLET | Freq: Two times a day (BID) | ORAL | 4 refills | Status: DC

## 2020-03-26 MED ORDER — DOXEPIN HCL 25 MG PO CAPS: 25.0000 mg | ORAL_CAPSULE | Freq: Every day | ORAL | 5 refills | Status: DC

## 2020-03-26 MED ORDER — DOXEPIN HCL 25 MG PO CAPS: 25.0000 mg | ORAL_CAPSULE | Freq: Every day | ORAL | Status: DC

## 2020-03-26 MED ORDER — CARBAMAZEPINE ER 100 MG PO TB12: 100.0000 mg | ORAL_TABLET | Freq: Two times a day (BID) | ORAL | 2 refills | Status: DC

## 2020-03-26 NOTE — Progress Notes (Signed)
[facility] MD/PA/NP OP Progress Note  03/26/2020 10:40 AM Roy Koch  MRN:  [id number]  Chief Complaint: Irritability  HPI:   Visit Diagnosis: Mild neurocognitive disorder  Today the patient seems to be stable.  We also talked to his wife.  At [phone].  She says he is stable.  He is less impulsive less irritable taking his medicines as prescribed.  There is some confusion about where he was getting his medicine.  We will clarify the status to get his medicine at [facility].  Patient is sleeping and eating well.  He continues to stay active.  His impulsivity is less.  He is less irritable and less agitated.  Patient has no evidence of psychosis.  He drinks no alcohol uses no drugs.  He is functioning fairly well.  Past Psychiatric History: See intake H&P for full details. Reviewed, with no updates at this time.   Past Medical History:  Past Medical History:  Diagnosis Date  . Anxiety   . Bipolar disorder ([place])   . Bipolar disorder ([place])   . Cardiac abnormality   . Chest discomfort 06/18/2018  . Cigarette smoker 06/18/2018  . COPD (chronic obstructive pulmonary disease) ([place])   . Essential hypertension 06/18/2018  . Generalized anxiety disorder 09/15/2017  . Headache   . Heart defect, congenital   . History of COVID-19   . History of repair of congenital atrial septal defect (ASD) 06/18/2018  . History of TIA (transient ischemic attack)   . History of tobacco use   . Hypertension   . Hypertension, essential, benign   . Low back pain 07/14/2013  . Migraine headache   . Neck pain   . Palpitations   . Stroke ([place])   . Tobacco abuse   . Ventricular septal defect     Past Surgical History:  Procedure Laterality Date  . AMPUTATION FINGER / THUMB    . CARDIAC SURGERY  at 44 years old    Family Psychiatric History: See intake H&P for full details. Reviewed, with no updates at this time.   Family History:  Family History  Problem Relation Age of Onset  . Schizophrenia  Father   . Bipolar disorder Maternal Uncle   . Bipolar disorder Maternal Grandmother     Social History:  Social History   Socioeconomic History  . Marital status: Divorced    Spouse name: Not on file  . Number of children: 2  . Years of education: Not on file  . Highest education level: 11th grade  Occupational History  . Not on file  Tobacco Use  . Smoking status: Current Every Day Smoker    Packs/day: 3.00    Types: Cigarettes  . Smokeless tobacco: Never Used  Vaping Use  . Vaping Use: Never used  Substance and Sexual Activity  . Alcohol use: No  . Drug use: No  . Sexual activity: Not on file  Other Topics Concern  . Not on file  Social History Narrative  . Not on file   Social Determinants of Health   Financial Resource Strain:   . Difficulty of Paying Living Expenses: Not on file  Food Insecurity:   . Worried About Charity fundraiser in the Last Year: Not on file  . Ran Out of Food in the Last Year: Not on file  Transportation Needs:   . Lack of Transportation (Medical): Not on file  . Lack of Transportation (Non-Medical): Not on file  Physical Activity:   . Days of  Exercise per Week: Not on file  . Minutes of Exercise per Session: Not on file  Stress:   . Feeling of Stress : Not on file  Social Connections:   . Frequency of Communication with Friends and Family: Not on file  . Frequency of Social Gatherings with Friends and Family: Not on file  . Attends Religious Services: Not on file  . Active Member of Clubs or Organizations: Not on file  . Attends Archivist Meetings: Not on file  . Marital Status: Not on file    Allergies: No Known Allergies  Metabolic Disorder Labs: No results found for: HGBA1C, MPG No results found for: PROLACTIN No results found for: CHOL, TRIG, HDL, CHOLHDL, VLDL, LDLCALC No results found for: TSH  Therapeutic Level Labs: No results found for: LITHIUM No results found for: VALPROATE No components found for:   CBMZ  Current Medications: Current Outpatient Medications  Medication Sig Dispense Refill  . albuterol (PROVENTIL HFA;VENTOLIN HFA) 108 (90 Base) MCG/ACT inhaler Inhale 1-2 puffs into the lungs daily as needed.    Marland Kitchen aspirin 325 MG tablet Take 325 mg by mouth daily.     . Garlic Oil 621 MG TABS Take 250 mg by mouth daily.    . SYMBICORT 160-4.5 MCG/ACT inhaler Inhale 2 puffs into the lungs 2 (two) times daily.     No current facility-administered medications for this visit.    Musculoskeletal: Strength & Muscle Tone: within normal limits Gait & Station: normal Patient leans: N/A  Psychiatric Specialty Exam: ROS [facility] MD/PA/NP OP Progress Note  03/26/2020 10:51 AM [name]  MRN:  [id number]  Chief Complaint: Irritability  HPI:   Visit Diagnosis: Mild neurocognitive disorder  Today the patient seems to be doing pretty well.  He himself admits that he is less irritable.  His wife agrees.  Actually it is his live-in girlfriend and is going to marry her tomorrow.  Today we spoke to the patient and to his fiance.  She says that he is actually doing better.  Unfortunately they never got any blood levels.  I am concerned about what medicines they are taking and what dose.  The patient reads the bottle and says he is taking 100 mg twice a day.  Her notes that he is taking 200 mg twice a day.  At this time we will clarify that he is going to take 100 mg twice daily and that they will in fact get blood work done in the next few weeks.  In the next few months the patient will return to see me in person.  The patient is not depressed.  He is sleeping and eating fairly well.  He drinks no alcohol uses no drugs and shows no overt evidence of psychosis.  He will continue taking Tegretol.  Will take 100 mg twice daily and he will continue trazodone for sleep.  Past Psychiatric History: See intake H&P for full details. Reviewed, with no updates at this time.   Past Medical History:  Past  Medical History:  Diagnosis Date  . Anxiety   . Bipolar disorder ([place])   . Bipolar disorder ([place])   . Cardiac abnormality   . Chest discomfort 06/18/2018  . Cigarette smoker 06/18/2018  . COPD (chronic obstructive pulmonary disease) ([place])   . Essential hypertension 06/18/2018  . Generalized anxiety disorder 09/15/2017  . Headache   . Heart defect, congenital   . History of COVID-19   . History of repair of congenital atrial  septal defect (ASD) 06/18/2018  . History of TIA (transient ischemic attack)   . History of tobacco use   . Hypertension   . Hypertension, essential, benign   . Low back pain 07/14/2013  . Migraine headache   . Neck pain   . Palpitations   . Stroke ([place])   . Tobacco abuse   . Ventricular septal defect     Past Surgical History:  Procedure Laterality Date  . AMPUTATION FINGER / THUMB    . CARDIAC SURGERY  at 44 years old    Family Psychiatric History: See intake H&P for full details. Reviewed, with no updates at this time.   Family History:  Family History  Problem Relation Age of Onset  . Schizophrenia Father   . Bipolar disorder Maternal Uncle   . Bipolar disorder Maternal Grandmother     Social History:  Social History   Socioeconomic History  . Marital status: Divorced    Spouse name: Not on file  . Number of children: 2  . Years of education: Not on file  . Highest education level: 11th grade  Occupational History  . Not on file  Tobacco Use  . Smoking status: Current Every Day Smoker    Packs/day: 3.00    Types: Cigarettes  . Smokeless tobacco: Never Used  Vaping Use  . Vaping Use: Never used  Substance and Sexual Activity  . Alcohol use: No  . Drug use: No  . Sexual activity: Not on file  Other Topics Concern  . Not on file  Social History Narrative  . Not on file   Social Determinants of Health   Financial Resource Strain:   . Difficulty of Paying Living Expenses: Not on file  Food Insecurity:   . Worried About Paediatric nurse in the Last Year: Not on file  . Ran Out of Food in the Last Year: Not on file  Transportation Needs:   . Lack of Transportation (Medical): Not on file  . Lack of Transportation (Non-Medical): Not on file  Physical Activity:   . Days of Exercise per Week: Not on file  . Minutes of Exercise per Session: Not on file  Stress:   . Feeling of Stress : Not on file  Social Connections:   . Frequency of Communication with Friends and Family: Not on file  . Frequency of Social Gatherings with Friends and Family: Not on file  . Attends Religious Services: Not on file  . Active Member of Clubs or Organizations: Not on file  . Attends Archivist Meetings: Not on file  . Marital Status: Not on file    Allergies: No Known Allergies  Metabolic Disorder Labs: No results found for: HGBA1C, MPG No results found for: PROLACTIN No results found for: CHOL, TRIG, HDL, CHOLHDL, VLDL, LDLCALC No results found for: TSH  Therapeutic Level Labs: No results found for: LITHIUM No results found for: VALPROATE No components found for:  CBMZ  Current Medications: Current Outpatient Medications  Medication Sig Dispense Refill  . albuterol (PROVENTIL HFA;VENTOLIN HFA) 108 (90 Base) MCG/ACT inhaler Inhale 1-2 puffs into the lungs daily as needed.    Marland Kitchen aspirin 325 MG tablet Take 325 mg by mouth daily.     . carbamazepine (TEGRETOL-XR) 100 MG 12 hr tablet Take 1 tablet (100 mg total) by mouth 2 (two) times daily. 60 tablet 2  . carbamazepine (TEGRETOL-XR) 100 MG 12 hr tablet Take 1 tablet (100 mg total) by mouth 2 (two)  times daily. 60 tablet 4  . doxepin (SINEQUAN) 25 MG capsule Take 1 capsule (25 mg total) by mouth at bedtime. 30 capsule 5  . doxepin (SINEQUAN) 25 MG capsule Take 1 capsule (25 mg total) by mouth at bedtime. 30 capsule 3  . Garlic Oil 532 MG TABS Take 250 mg by mouth daily.    . SYMBICORT 160-4.5 MCG/ACT inhaler Inhale 2 puffs into the lungs 2 (two) times daily.      Current Facility-Administered Medications  Medication Dose Route Frequency Provider Last Rate Last Admin  . doxepin (SINEQUAN) capsule 25 mg  25 mg Oral QHS Norma Fredrickson, MD        Musculoskeletal: Strength & Muscle Tone: within normal limits Gait & Station: normal Patient leans: N/A  Psychiatric Specialty Exam: ROS  There were no vitals taken for this visit.There is no height or weight on file to calculate BMI.  General Appearance: Casual and Disheveled  Eye Contact:  Fair  Speech:  Clear and Coherent and Normal Rate  Volume:  Normal  Mood:  Less irritable  Affect:  Appropriate and Congruent  Thought Process:  Goal Directed and Descriptions of Associations: Intact  Orientation:  Full (Time, Place, and Person)  Thought Content: Logical and Focused on Xanax   Suicidal Thoughts:  No  Homicidal Thoughts:  No  Memory:  Immediate;   Poor  Judgement:  Fair  Insight:  Present, Shallow and Improving  Psychomotor Activity:  Normal  Concentration:  Concentration: Fair  Recall:  [employer] of Knowledge: Fair  Language: Fair  Akathisia:  Negative  Handed:  Right  AIMS (if indicated): not done  Assets:  Communication Skills Desire for Improvement  ADL's:  Intact  Cognition: WNL  Sleep:  Fair   Screenings: PHQ2-9     Patient Outreach Telephone from 03/12/2017 in [place]  PHQ-2 Total Score 2  PHQ-9 Total Score 4       Assessment and Plan   This patient is #1 problem is that of vascular dementia.  He has associated irritability and takes Tegretol.  It is not clear how much she is taking.  At this time will be conservative and given 100 mg twice daily in addition to get some blood work done in the next few weeks.  His second problem is that of insomnia.  He will continue taking trazodone for that and works pretty well.  Patient is functioning fairly well.  He is stable. Status of current problems: new to [employer] Ordered: No orders of the defined  types were placed in this encounter.   Labs Reviewed: na  Collateral Obtained/Records Reviewed: Wife is present and able to corroborate episodic agitation and explosive behaviors when he is angry  Plan:    Equetro 100 mg twice daily.  The patient return to see [name] in 2-1/2 months and shared how often he is been explosive.  At that time we will get blood work including a comprehensive metabolic panel.  Jerral Ralph, MD 03/26/2020, 10:51 AM  There were no vitals taken for this visit.There is no height or weight on file to calculate BMI.  General Appearance: Casual and Disheveled  Eye Contact:  Fair  Speech:  Clear and Coherent and Normal Rate  Volume:  Normal  Mood:  Less irritable  Affect:  Appropriate and Congruent  Thought Process:  Goal Directed and Descriptions of Associations: Intact  Orientation:  Full (Time, Place, and Person)  Thought Content: Logical and Focused on  Xanax   Suicidal Thoughts:  No  Homicidal Thoughts:  No  Memory:  Immediate;   Poor  Judgement:  Fair  Insight:  Present, Shallow and Improving  Psychomotor Activity:  Normal  Concentration:  Concentration: Fair  Recall:  [employer] of Knowledge: Fair  Language: Fair  Akathisia:  Negative  Handed:  Right  AIMS (if indicated): not done  Assets:  Communication Skills Desire for Improvement  ADL's:  Intact  Cognition: WNL  Sleep:  Fair   Screenings: PHQ2-9     Patient Outreach Telephone from 03/12/2017 in [place]  PHQ-2 Total Score 2  PHQ-9 Total Score 4       Assessment and Plan   This patient is diagnosed with vascular dementia.  He has significant irritability.  Therefore he had a behavioral disturbance with dementia.  At this time he will continue taking Tegretol 100 mg twice daily we will attempt to get his laboratory liver enzymes from Dr. Estil Daft in [facility].  He also has a problem with insomnia.  Today we will discontinue his Seroquel which has a bad  effect the next day.  Begin doxepin 25 mg.  This patient will be seen again in 3 months with his wife. Status of current problems: new to [employer] Ordered: No orders of the defined types were placed in this encounter.   Labs Reviewed: na  Collateral Obtained/Records Reviewed: Wife is present and able to corroborate episodic agitation and explosive behaviors when he is angry  Plan:    Equetro 100 mg twice daily.  The patient return to see [name] in 2-1/2 months and shared how often he is been explosive.  At that time we will get blood work including a comprehensive metabolic panel.  Jerral Ralph, MD 03/26/2020, 10:40 AM

## 2020-04-08 DIAGNOSIS — R002 Palpitations: Secondary | ICD-10-CM | POA: Diagnosis not present

## 2020-05-03 DIAGNOSIS — Z9181 History of falling: Secondary | ICD-10-CM | POA: Diagnosis not present

## 2020-05-03 DIAGNOSIS — Z1331 Encounter for screening for depression: Secondary | ICD-10-CM | POA: Diagnosis not present

## 2020-05-03 DIAGNOSIS — E669 Obesity, unspecified: Secondary | ICD-10-CM | POA: Diagnosis not present

## 2020-05-03 DIAGNOSIS — Z0000 Encounter for general adult medical examination without abnormal findings: Secondary | ICD-10-CM | POA: Diagnosis not present

## 2020-05-03 DIAGNOSIS — E785 Hyperlipidemia, unspecified: Secondary | ICD-10-CM | POA: Diagnosis not present

## 2020-05-05 DIAGNOSIS — Z20822 Contact with and (suspected) exposure to covid-19: Secondary | ICD-10-CM | POA: Diagnosis not present

## 2020-05-05 DIAGNOSIS — J449 Chronic obstructive pulmonary disease, unspecified: Secondary | ICD-10-CM | POA: Diagnosis not present

## 2020-05-05 DIAGNOSIS — J069 Acute upper respiratory infection, unspecified: Secondary | ICD-10-CM | POA: Diagnosis not present

## 2020-06-09 ENCOUNTER — Other Ambulatory Visit: Payer: Self-pay

## 2020-06-10 ENCOUNTER — Telehealth: Payer: Self-pay

## 2020-06-10 NOTE — Telephone Encounter (Signed)
VM left for pt to callback to reschedule appt for 06/14/20.

## 2020-06-14 ENCOUNTER — Ambulatory Visit: Payer: Medicare HMO | Admitting: Cardiology

## 2020-06-18 ENCOUNTER — Telehealth (INDEPENDENT_AMBULATORY_CARE_PROVIDER_SITE_OTHER): Payer: Medicare HMO | Admitting: Psychiatry

## 2020-06-18 ENCOUNTER — Other Ambulatory Visit: Payer: Self-pay

## 2020-06-18 DIAGNOSIS — F0151 Vascular dementia with behavioral disturbance: Secondary | ICD-10-CM

## 2020-06-18 DIAGNOSIS — F01518 Vascular dementia, unspecified severity, with other behavioral disturbance: Secondary | ICD-10-CM

## 2020-06-18 DIAGNOSIS — R454 Irritability and anger: Secondary | ICD-10-CM

## 2020-06-18 MED ORDER — CARBAMAZEPINE ER 200 MG PO TB12: 200.0000 mg | ORAL_TABLET | Freq: Two times a day (BID) | ORAL | 5 refills | Status: DC

## 2020-06-18 MED ORDER — DOXEPIN HCL 25 MG PO CAPS: 25.0000 mg | ORAL_CAPSULE | Freq: Every day | ORAL | 3 refills | Status: DC

## 2020-06-18 MED ORDER — DOXEPIN HCL 25 MG PO CAPS: 25.0000 mg | ORAL_CAPSULE | Freq: Every day | ORAL | 5 refills | Status: DC

## 2020-06-18 NOTE — Progress Notes (Signed)
BH MD/PA/NP OP Progress Note  06/18/2020 10:26 AM Roy Koch  MRN:  [id number]  Chief Complaint: Irritability  HPI: Today the patient is doing fair.  His wife is not there.  The patient stays very active.  He is working a lot of different partners.  He understands that he takes Tegretol as a mood stabilizer and he says that he is less impulsive and less irritable.  He says things do not bother him in his months.  He is able to let things go.  He has a lot of antigovernment feelings and refuses to get a vaccine.  He claims he knows people where he a magnet with stated to the arm and knows people who have died regardless of getting the vaccine from the COVID virus.  Today the patient denies being depressed.  He denies anxiety.  He is not drinking any alcohol or using any drugs.  He says he is sleeping and eating well as long as he takes the doxepin for sleep.  He takes Tegretol regularly and that he just picked it up.  Medically he has no complaints.  He works out on a regular basis.  He gets along very well with his wife.  His wife works all the time and she is not present at this time.  This patient will be seen again in 4 months.  He will continue taking his medicines as prescribed.   Visit Diagnosis: Mild neurocognitive disorder    Past Medical History:  Past Medical History:  Diagnosis Date  . Anxiety   . Bipolar disorder ([place])   . Bipolar disorder ([place])   . Cardiac abnormality   . Chest discomfort 46/21/2020  . Cigarette smoker 46/21/2020  . COPD (chronic obstructive pulmonary disease) ([place])   . Essential hypertension 46/21/2020  . Generalized anxiety disorder 09/15/2017  . Headache   . Heart defect, congenital   . History of COVID-19   . History of repair of congenital atrial septal defect (ASD) 46/21/2020  . History of TIA (transient ischemic attack)   . History of tobacco use   . Hypertension   . Hypertension, essential, benign   . Low back pain 07/14/2013  . Migraine headache    . Neck pain   . Palpitations   . Stroke ([place])   . Tobacco abuse   . Ventricular septal defect     Past Surgical History:  Procedure Laterality Date  . AMPUTATION FINGER / THUMB    . CARDIAC SURGERY  at 45 years old    Family Psychiatric History: See intake H&P for full details. Reviewed, with no updates at this time.   Family History:  Family History  Problem Relation Age of Onset  . Schizophrenia Father   . Bipolar disorder Maternal Uncle   . Bipolar disorder Maternal Grandmother     Social History:  Social History   Socioeconomic History  . Marital status: Divorced    Spouse name: Not on file  . Number of children: 2  . Years of education: Not on file  . Highest education level: 11th grade  Occupational History  . Not on file  Tobacco Use  . Smoking status: Current Every Day Smoker    Packs/day: 3.00    Types: Cigarettes  . Smokeless tobacco: Never Used  Vaping Use  . Vaping Use: Never used  Substance and Sexual Activity  . Alcohol use: No  . Drug use: No  . Sexual activity: Not on file  Other Topics Concern  .  Not on file  Social History Narrative  . Not on file   Social Determinants of Health   Financial Resource Strain: Not on file  Food Insecurity: Not on file  Transportation Needs: Not on file  Physical Activity: Not on file  Stress: Not on file  Social Connections: Not on file    Allergies: No Known Allergies  Metabolic Disorder Labs: No results found for: HGBA1C, MPG No results found for: PROLACTIN No results found for: CHOL, TRIG, HDL, CHOLHDL, VLDL, LDLCALC No results found for: TSH  Therapeutic Level Labs: No results found for: LITHIUM No results found for: VALPROATE No components found for:  CBMZ  Current Medications: Current Outpatient Medications  Medication Sig Dispense Refill  . albuterol (PROVENTIL HFA;VENTOLIN HFA) 108 (90 Base) MCG/ACT inhaler Inhale 1-2 puffs into the lungs daily as needed.    Marland Kitchen aspirin 325 MG tablet  Take 325 mg by mouth daily.     . carbamazepine (TEGRETOL XR) 200 MG 12 hr tablet Take 1 tablet (200 mg total) by mouth 2 (two) times daily. 60 tablet 5  . doxepin (SINEQUAN) 25 MG capsule Take 1 capsule (25 mg total) by mouth at bedtime. 30 capsule 5  . doxepin (SINEQUAN) 25 MG capsule Take 1 capsule (25 mg total) by mouth at bedtime. 30 capsule 3  . Garlic Oil 440 MG TABS Take 250 mg by mouth daily.    Marland Kitchen lisinopril (ZESTRIL) 40 MG tablet Take 40 mg by mouth daily.    . SYMBICORT 160-4.5 MCG/ACT inhaler Inhale 2 puffs into the lungs 2 (two) times daily.     Current Facility-Administered Medications  Medication Dose Route Frequency Provider Last Rate Last Admin  . doxepin (SINEQUAN) capsule 25 mg  25 mg Oral QHS Norma Fredrickson, MD        Musculoskeletal: Strength & Muscle Tone: within normal limits Gait & Station: normal Patient leans: N/A  Psychiatric Specialty Exam: ROS BH MD/PA/NP OP Progress Note  06/18/2020 10:26 AM [name]  MRN:  [id number]  Chief Complaint: Irritability  HPI:   Visit Diagnosis: Mild neurocognitive disorder  Today the patient seems to be doing pretty well.  He himself admits that he is less irritable.  His wife agrees.  Actually it is his live-in girlfriend and is going to marry her tomorrow.  Today we spoke to the patient and to his fiance.  She says that he is actually doing better.  Unfortunately they never got any blood levels.  I am concerned about what medicines they are taking and what dose.  The patient reads the bottle and says he is taking 100 mg twice a day.  Her notes that he is taking 200 mg twice a day.  At this time we will clarify that he is going to take 100 mg twice daily and that they will in fact get blood work done in the next few weeks.  In the next few months the patient will return to see me in person.  The patient is not depressed.  He is sleeping and eating fairly well.  He drinks no alcohol uses no drugs and shows no overt  evidence of psychosis.  He will continue taking Tegretol.  Will take 100 mg twice daily and he will continue trazodone for sleep.  Past Psychiatric History: See intake H&P for full details. Reviewed, with no updates at this time.   Past Medical History:  Past Medical History:  Diagnosis Date  . Anxiety   . Bipolar disorder (  [place])   . Bipolar disorder ([place])   . Cardiac abnormality   . Chest discomfort 46/21/2020  . Cigarette smoker 46/21/2020  . COPD (chronic obstructive pulmonary disease) ([place])   . Essential hypertension 46/21/2020  . Generalized anxiety disorder 09/15/2017  . Headache   . Heart defect, congenital   . History of COVID-19   . History of repair of congenital atrial septal defect (ASD) 46/21/2020  . History of TIA (transient ischemic attack)   . History of tobacco use   . Hypertension   . Hypertension, essential, benign   . Low back pain 07/14/2013  . Migraine headache   . Neck pain   . Palpitations   . Stroke ([place])   . Tobacco abuse   . Ventricular septal defect     Past Surgical History:  Procedure Laterality Date  . AMPUTATION FINGER / THUMB    . CARDIAC SURGERY  at 46 years old    Family Psychiatric History: See intake H&P for full details. Reviewed, with no updates at this time.   Family History:  Family History  Problem Relation Age of Onset  . Schizophrenia Father   . Bipolar disorder Maternal Uncle   . Bipolar disorder Maternal Grandmother     Social History:  Social History   Socioeconomic History  . Marital status: Divorced    Spouse name: Not on file  . Number of children: 2  . Years of education: Not on file  . Highest education level: 11th grade  Occupational History  . Not on file  Tobacco Use  . Smoking status: Current Every Day Smoker    Packs/day: 3.00    Types: Cigarettes  . Smokeless tobacco: Never Used  Vaping Use  . Vaping Use: Never used  Substance and Sexual Activity  . Alcohol use: No  . Drug use: No  . Sexual  activity: Not on file  Other Topics Concern  . Not on file  Social History Narrative  . Not on file   Social Determinants of Health   Financial Resource Strain: Not on file  Food Insecurity: Not on file  Transportation Needs: Not on file  Physical Activity: Not on file  Stress: Not on file  Social Connections: Not on file    Allergies: No Known Allergies  Metabolic Disorder Labs: No results found for: HGBA1C, MPG No results found for: PROLACTIN No results found for: CHOL, TRIG, HDL, CHOLHDL, VLDL, LDLCALC No results found for: TSH  Therapeutic Level Labs: No results found for: LITHIUM No results found for: VALPROATE No components found for:  CBMZ  Current Medications: Current Outpatient Medications  Medication Sig Dispense Refill  . albuterol (PROVENTIL HFA;VENTOLIN HFA) 108 (90 Base) MCG/ACT inhaler Inhale 1-2 puffs into the lungs daily as needed.    Marland Kitchen aspirin 325 MG tablet Take 325 mg by mouth daily.     . carbamazepine (TEGRETOL XR) 200 MG 12 hr tablet Take 1 tablet (200 mg total) by mouth 2 (two) times daily. 60 tablet 5  . doxepin (SINEQUAN) 25 MG capsule Take 1 capsule (25 mg total) by mouth at bedtime. 30 capsule 5  . doxepin (SINEQUAN) 25 MG capsule Take 1 capsule (25 mg total) by mouth at bedtime. 30 capsule 3  . Garlic Oil 833 MG TABS Take 250 mg by mouth daily.    Marland Kitchen lisinopril (ZESTRIL) 40 MG tablet Take 40 mg by mouth daily.    . SYMBICORT 160-4.5 MCG/ACT inhaler Inhale 2 puffs into the lungs 2 (two) times  daily.     Current Facility-Administered Medications  Medication Dose Route Frequency Provider Last Rate Last Admin  . doxepin (SINEQUAN) capsule 25 mg  25 mg Oral QHS Norma Fredrickson, MD        Musculoskeletal: Strength & Muscle Tone: within normal limits Gait & Station: normal Patient leans: N/A  Psychiatric Specialty Exam: ROS  There were no vitals taken for this visit.There is no height or weight on file to calculate BMI.  General Appearance:  Casual and Disheveled  Eye Contact:  Fair  Speech:  Clear and Coherent and Normal Rate  Volume:  Normal  Mood:  Less irritable  Affect:  Appropriate and Congruent  Thought Process:  Goal Directed and Descriptions of Associations: Intact  Orientation:  Full (Time, Place, and Person)  Thought Content: Logical and Focused on Xanax   Suicidal Thoughts:  No  Homicidal Thoughts:  No  Memory:  Immediate;   Poor  Judgement:  Fair  Insight:  Present, Shallow and Improving  Psychomotor Activity:  Normal  Concentration:  Concentration: Fair  Recall:  [employer] of Knowledge: Fair  Language: Fair  Akathisia:  Negative  Handed:  Right  AIMS (if indicated): not done  Assets:  Communication Skills Desire for Improvement  ADL's:  Intact  Cognition: WNL  Sleep:  Fair   Screenings: PHQ2-9   [facility] Patient Outreach Telephone from 03/12/2017 in [place]  PHQ-2 Total Score 2  PHQ-9 Total Score 4       Assessment and Plan   This patient's first problem is that of vascular dementia.  This is unfortunate and that he is very young.  The patient does seems to be functioning fairly well.  He continues to work out and work on his cars and motorcycles.  His first problem therefore is vascular dementia with irritability.  At this time we will continue taking Tegretol 200 mg twice daily.  When his next visit we will clarify his blood work.  His second problem is that of insomnia.  He takes doxepin and does very well.  He will return to see me hopefully in 4 months.  He is functioning reasonably well.  He is not suicidal and he is not homicidal he is not violent. Labs Ordered: No orders of the defined types were placed in this encounter.   Labs Reviewed: na  Collateral Obtained/Records Reviewed: Wife is present and able to corroborate episodic agitation and explosive behaviors when he is angry  Plan:    Equetro 100 mg twice daily.  The patient return to see [name] in 2-1/2  months and shared how often he is been explosive.  At that time we will get blood work including a comprehensive metabolic panel.  Jerral Ralph, MD 06/18/2020, 10:26 AM  There were no vitals taken for this visit.There is no height or weight on file to calculate BMI.  General Appearance: Casual and Disheveled  Eye Contact:  Fair  Speech:  Clear and Coherent and Normal Rate  Volume:  Normal  Mood:  Less irritable  Affect:  Appropriate and Congruent  Thought Process:  Goal Directed and Descriptions of Associations: Intact  Orientation:  Full (Time, Place, and Person)  Thought Content: Logical and Focused on Xanax   Suicidal Thoughts:  No  Homicidal Thoughts:  No  Memory:  Immediate;   Poor  Judgement:  Fair  Insight:  Present, Shallow and Improving  Psychomotor Activity:  Normal  Concentration:  Concentration: Fair  Recall:  Fair  Fund of Knowledge: Fair  Language: Fair  Akathisia:  Negative  Handed:  Right  AIMS (if indicated): not done  Assets:  Communication Skills Desire for Improvement  ADL's:  Intact  Cognition: WNL  Sleep:  Fair   Screenings: [place] [facility] Patient Outreach Telephone from 03/12/2017 in [place]  PHQ-2 Total Score 2  PHQ-9 Total Score 4       Assessment and Plan   This patient is diagnosed with vascular dementia.  He has significant irritability.  Therefore he had a behavioral disturbance with dementia.  At this time he will continue taking Tegretol 100 mg twice daily we will attempt to get his laboratory liver enzymes from Dr. Estil Daft in [facility].  He also has a problem with insomnia.  Today we will discontinue his Seroquel which has a bad effect the next day.  Begin doxepin 25 mg.  This patient will be seen again in 3 months with his wife. Status of current problems: new to [employer] Ordered: No orders of the defined types were placed in this encounter.   Labs Reviewed: na  Collateral  Obtained/Records Reviewed: Wife is present and able to corroborate episodic agitation and explosive behaviors when he is angry  Plan:    Equetro 100 mg twice daily.  The patient return to see [name] in 2-1/2 months and shared how often he is been explosive.  At that time we will get blood work including a comprehensive metabolic panel.  Jerral Ralph, MD 06/18/2020, 10:26 AM

## 2020-06-22 ENCOUNTER — Ambulatory Visit: Payer: Medicare HMO | Admitting: Cardiology

## 2020-06-22 ENCOUNTER — Encounter: Payer: Self-pay | Admitting: Cardiology

## 2020-06-22 ENCOUNTER — Other Ambulatory Visit: Payer: Self-pay

## 2020-06-22 VITALS — BP 136/96 | HR 82 | Ht 72.0 in | Wt 232.6 lb

## 2020-06-22 DIAGNOSIS — I10 Essential (primary) hypertension: Secondary | ICD-10-CM

## 2020-06-22 DIAGNOSIS — Z8774 Personal history of (corrected) congenital malformations of heart and circulatory system: Secondary | ICD-10-CM | POA: Diagnosis not present

## 2020-06-22 DIAGNOSIS — R931 Abnormal findings on diagnostic imaging of heart and coronary circulation: Secondary | ICD-10-CM

## 2020-06-22 DIAGNOSIS — F17210 Nicotine dependence, cigarettes, uncomplicated: Secondary | ICD-10-CM | POA: Diagnosis not present

## 2020-06-22 NOTE — Progress Notes (Signed)
Cardiology Office Note:    Date:  06/22/2020   ID:  Roy Koch, Roy Koch [birth year], MRN [id number]  PCP:  [name], PA-C  Cardiologist:  Jenean Lindau, MD   Referring MD: [name], PA-C    ASSESSMENT:    1. Essential hypertension   2. Cigarette smoker   3. History of repair of congenital atrial septal defect (ASD)    PLAN:    In order of problems listed above:  1. Primary prevention stressed with the patient.  Importance of compliance with diet medication stressed any vocalized understanding.  Advised to walk at least half an hour a day 5 days a week and he promises to do so. 2. Essential hypertension: Blood pressure stable and diet was emphasized.  His blood pressures at home are fine at home.  Lifestyle modification was urged salt intake issues were discussed 3. Mixed dyslipidemia: Diet was emphasized.  Lipids were reviewed.  In view of risk stratification I will send him for a calcium score.  He is agreeable.  This will help me assess his lipids and address them better. 4. Cigarette smoker: I spent 5 minutes with the patient discussing solely about smoking. Smoking cessation was counseled. I suggested to the patient also different medications and pharmacological interventions. Patient is keen to try stopping on its own at this time. He will get back to me if he needs any further assistance in this matter. 5. Patient will be seen in follow-up appointment in 6 months or earlier if the patient has any concerns    Medication Adjustments/Labs and Tests Ordered: Current medicines are reviewed at length with the patient today.  Concerns regarding medicines are outlined above.  Orders Placed This Encounter  Procedures  . CT CARDIAC SCORING (SELF PAY ONLY)   No orders of the defined types were placed in this encounter.    No chief complaint on file.    History of Present Illness:    Roy Koch is a 45 y.o. male.  Patient has past medical history of ASD post  repair, essential hypertension dyslipidemia and unfortunately continues to smoke.  He leads a sedentary lifestyle.  He has no chest pain orthopnea or PND.  At the time of my evaluation, the patient is alert awake oriented and in no distress.  His family member accompanies him for this visit.  Past Medical History:  Diagnosis Date  . Anxiety   . Bipolar disorder ([place])   . Bipolar disorder ([place])   . Cardiac abnormality   . Chest discomfort 06/18/2018  . Cigarette smoker 06/18/2018  . COPD (chronic obstructive pulmonary disease) ([place])   . Essential hypertension 06/18/2018  . Generalized anxiety disorder 09/15/2017  . Headache   . Heart defect, congenital   . History of COVID-19   . History of repair of congenital atrial septal defect (ASD) 06/18/2018  . History of TIA (transient ischemic attack)   . History of tobacco use   . Hypertension   . Hypertension, essential, benign   . Low back pain 07/14/2013  . Migraine headache   . Neck pain   . Palpitations   . Stroke ([place])   . Tobacco abuse   . Ventricular septal defect     Past Surgical History:  Procedure Laterality Date  . AMPUTATION FINGER / THUMB    . CARDIAC SURGERY  at 45 years old    Current Medications: Current Meds  Medication Sig  . albuterol (PROVENTIL HFA;VENTOLIN HFA) 108 (90 Base) MCG/ACT inhaler  Inhale 1-2 puffs into the lungs daily as needed.  Marland Kitchen aspirin 325 MG tablet Take 325 mg by mouth daily.   Marland Kitchen doxepin (SINEQUAN) 25 MG capsule Take 1 capsule (25 mg total) by mouth at bedtime.  . Garlic Oil 454 MG TABS Take 250 mg by mouth daily.  Marland Kitchen lisinopril (ZESTRIL) 40 MG tablet Take 40 mg by mouth daily.  . SYMBICORT 160-4.5 MCG/ACT inhaler Inhale 2 puffs into the lungs 2 (two) times daily.   Current Facility-Administered Medications for the 06/22/20 encounter (Office Visit) with Lonna Rabold, Reita Cliche, MD  Medication  . doxepin (SINEQUAN) capsule 25 mg     Allergies:   Patient has no known allergies.   Social History    Socioeconomic History  . Marital status: Divorced    Spouse name: Not on file  . Number of children: 2  . Years of education: Not on file  . Highest education level: 11th grade  Occupational History  . Not on file  Tobacco Use  . Smoking status: Current Every Day Smoker    Packs/day: 3.00    Types: Cigarettes  . Smokeless tobacco: Never Used  Vaping Use  . Vaping Use: Never used  Substance and Sexual Activity  . Alcohol use: No  . Drug use: No  . Sexual activity: Not on file  Other Topics Concern  . Not on file  Social History Narrative  . Not on file   Social Determinants of Health   Financial Resource Strain: Not on file  Food Insecurity: Not on file  Transportation Needs: Not on file  Physical Activity: Not on file  Stress: Not on file  Social Connections: Not on file     Family History: The patient's family history includes Bipolar disorder in his maternal grandmother and maternal uncle; Schizophrenia in his father.  ROS:   Please see the history of present illness.    All other systems reviewed and are negative.  EKGs/Labs/Other Studies Reviewed:    The following studies were reviewed today: I discussed my findings with the patient at length.  Event monitoring was unremarkable.   Recent Labs: No results found for requested labs within last 8760 hours.  Recent Lipid Panel No results found for: CHOL, TRIG, HDL, CHOLHDL, VLDL, LDLCALC, LDLDIRECT  Physical Exam:    VS:  BP (!) 136/96   Pulse 82   Ht 6' (1.829 m)   Wt 232 lb 9.6 oz (105.5 kg)   SpO2 94%   BMI 31.55 kg/m     Wt Readings from Last 3 Encounters:  06/22/20 232 lb 9.6 oz (105.5 kg)  03/11/20 230 lb 6.4 oz (104.5 kg)  08/31/19 230 lb (104.3 kg)     GEN: Patient is in no acute distress HEENT: Normal NECK: No JVD; No carotid bruits LYMPHATICS: No lymphadenopathy CARDIAC: Hear sounds regular, 2/6 systolic murmur at the apex. RESPIRATORY:  Clear to auscultation without rales,  wheezing or rhonchi  ABDOMEN: Soft, non-tender, non-distended MUSCULOSKELETAL:  No edema; No deformity  SKIN: Warm and dry NEUROLOGIC:  Alert and oriented x 3 PSYCHIATRIC:  Normal affect   Signed, Jenean Lindau, MD  06/22/2020 10:25 AM    [place]

## 2020-06-22 NOTE — Patient Instructions (Addendum)
Medication Instructions:  No medication changes. *If you need a refill on your cardiac medications before your next appointment, please call your pharmacy*   Lab Work: None ordered If you have labs (blood work) drawn today and your tests are completely normal, you will receive your results only by: MyChart Message (if you have MyChart) OR A paper copy in the mail If you have any lab test that is abnormal or we need to change your treatment, we will call you to review the results.   Testing/Procedures:  We will order CT coronary calcium score. It will cost $99.00 and is not covered by insurance.  Please call [phone] to schedule.   [facility]  [address]  [place], [place] [address]    Follow-Up: At [facility], you and your health needs are our priority.  As part of our continuing mission to provide you with exceptional heart care, we have created designated Provider Care Teams.  These Care Teams include your primary Cardiologist (physician) and Advanced Practice Providers (APPs -  Physician Assistants and Nurse Practitioners) who all work together to provide you with the care you need, when you need it.  We recommend signing up for the patient portal called "MyChart".  Sign up information is provided on this After Visit Summary.  MyChart is used to connect with patients for Virtual Visits (Telemedicine).  Patients are able to view lab/test results, encounter notes, upcoming appointments, etc.  Non-urgent messages can be sent to your provider as well.   To learn more about what you can do with MyChart, go to [email or web address].    Your next appointment:   6 month(s)  The format for your next appointment:   In Person  Provider:   Rajan Revankar, MD   Other Instructions  Coronary Calcium Scan A coronary calcium scan is an imaging test used to look for deposits of plaque in the inner lining of the blood vessels of the heart (coronary arteries).  Plaque is made up of calcium, protein, and fatty substances. These deposits of plaque can partly clog and narrow the coronary arteries without producing any symptoms or warning signs. This puts a person at risk for a heart attack. This test is recommended for people who are at moderate risk for heart disease. The test can find plaque deposits before symptoms develop. Tell a health care provider about: Any allergies you have. All medicines you are taking, including vitamins, herbs, eye drops, creams, and over-the-counter medicines. Any problems you or family members have had with anesthetic medicines. Any blood disorders you have. Any surgeries you have had. Any medical conditions you have. Whether you are pregnant or may be pregnant. What are the risks? Generally, this is a safe procedure. However, problems may occur, including: Harm to a pregnant woman and her unborn baby. This test involves the use of radiation. Radiation exposure can be dangerous to a pregnant woman and her unborn baby. If you are pregnant or think you may be pregnant, you should not have this procedure done. Slight increase in the risk of cancer. This is because of the radiation involved in the test. What happens before the procedure? Ask your health care provider for any specific instructions on how to prepare for this procedure. You may be asked to avoid products that contain caffeine, tobacco, or nicotine for 4 hours before the procedure. What happens during the procedure? You will undress and remove any jewelry from your neck or chest. You will put on   a hospital gown. Sticky electrodes will be placed on your chest. The electrodes will be connected to an electrocardiogram (ECG) machine to record a tracing of the electrical activity of your heart. You will lie down on a curved bed that is attached to the CT scanner. You may be given medicine to slow down your heart rate so that clear pictures can be created. You will be  moved into the CT scanner, and the CT scanner will take pictures of your heart. During this time, you will be asked to lie still and hold your breath for 2-3 seconds at a time while each picture of your heart is being taken. The procedure may vary among health care providers and hospitals.    What happens after the procedure? You can get dressed. You can return to your normal activities. It is up to you to get the results of your procedure. Ask your health care provider, or the department that is doing the procedure, when your results will be ready. Summary A coronary calcium scan is an imaging test used to look for deposits of plaque in the inner lining of the blood vessels of the heart (coronary arteries). Plaque is made up of calcium, protein, and fatty substances. Generally, this is a safe procedure. Tell your health care provider if you are pregnant or may be pregnant. Ask your health care provider for any specific instructions on how to prepare for this procedure. A CT scanner will take pictures of your heart. You can return to your normal activities after the scan is done. This information is not intended to replace advice given to you by your health care provider. Make sure you discuss any questions you have with your health care provider. Document Revised: 12/03/2018 Document Reviewed: 12/03/2018 Elsevier Patient Education  2021 Elsevier Inc.  

## 2020-07-07 ENCOUNTER — Ambulatory Visit
Admission: RE | Admit: 2020-07-07 | Discharge: 2020-07-07 | Disposition: A | Discharge status: Home / Self Care | Payer: Self-pay | Source: Ambulatory Visit | Attending: Cardiology | Admitting: Cardiology

## 2020-07-07 ENCOUNTER — Other Ambulatory Visit: Payer: Self-pay

## 2020-07-07 DIAGNOSIS — F17210 Nicotine dependence, cigarettes, uncomplicated: Secondary | ICD-10-CM

## 2020-07-07 DIAGNOSIS — Z8774 Personal history of (corrected) congenital malformations of heart and circulatory system: Secondary | ICD-10-CM

## 2020-07-07 DIAGNOSIS — I10 Essential (primary) hypertension: Secondary | ICD-10-CM

## 2020-07-09 ENCOUNTER — Telehealth: Payer: Self-pay | Admitting: Cardiology

## 2020-07-09 NOTE — Telephone Encounter (Signed)
Pt called in returning [facility] call for CT results      best number [phone]

## 2020-07-09 NOTE — Addendum Note (Signed)
Addended by: Truddie Hidden on: 07/09/2020 01:30 PM   Modules accepted: Orders

## 2020-07-09 NOTE — Telephone Encounter (Signed)
See result note.  

## 2020-07-14 DIAGNOSIS — J441 Chronic obstructive pulmonary disease with (acute) exacerbation: Secondary | ICD-10-CM | POA: Diagnosis not present

## 2020-07-14 DIAGNOSIS — Z6832 Body mass index (BMI) 32.0-32.9, adult: Secondary | ICD-10-CM | POA: Diagnosis not present

## 2020-07-14 DIAGNOSIS — Z20822 Contact with and (suspected) exposure to covid-19: Secondary | ICD-10-CM | POA: Diagnosis not present

## 2020-07-16 DIAGNOSIS — R931 Abnormal findings on diagnostic imaging of heart and coronary circulation: Secondary | ICD-10-CM | POA: Diagnosis not present

## 2020-07-16 DIAGNOSIS — I10 Essential (primary) hypertension: Secondary | ICD-10-CM | POA: Diagnosis not present

## 2020-07-17 LAB — BASIC METABOLIC PANEL
BUN/Creatinine Ratio: 9 (ref 9–20)
BUN: 8 mg/dL (ref 6–24)
CO2: 23 mmol/L (ref 20–29)
Calcium: 9.1 mg/dL (ref 8.7–10.2)
Chloride: 98 mmol/L (ref 96–106)
Creatinine, Ser: 0.91 mg/dL (ref 0.76–1.27)
GFR calc Af Amer: 117 mL/min/{1.73_m2} (ref >59)
GFR calc non Af Amer: 101 mL/min/{1.73_m2} (ref >59)
Glucose: 81 mg/dL (ref 65–99)
Potassium: 3.9 mmol/L (ref 3.5–5.2)
Sodium: 138 mmol/L (ref 134–144)

## 2020-07-17 LAB — LIPID PANEL
Chol/HDL Ratio: 7.7 ratio — ABNORMAL HIGH (ref 0.0–5.0)
Cholesterol, Total: 177 mg/dL (ref 100–199)
HDL: 23 mg/dL — ABNORMAL LOW (ref >39)
LDL Chol Calc (NIH): 111 mg/dL — ABNORMAL HIGH (ref 0–99)
Triglycerides: 248 mg/dL — ABNORMAL HIGH (ref 0–149)
VLDL Cholesterol Cal: 43 mg/dL — ABNORMAL HIGH (ref 5–40)

## 2020-07-17 LAB — HEPATIC FUNCTION PANEL
ALT: 108 IU/L — ABNORMAL HIGH (ref 0–44)
AST: 61 IU/L — ABNORMAL HIGH (ref 0–40)
Albumin: 4.6 g/dL (ref 4.0–5.0)
Alkaline Phosphatase: 97 IU/L (ref 44–121)
Bilirubin Total: 0.6 mg/dL (ref 0.0–1.2)
Bilirubin, Direct: 0.2 mg/dL (ref 0.00–0.40)
Total Protein: 7.1 g/dL (ref 6.0–8.5)

## 2020-07-26 DIAGNOSIS — R748 Abnormal levels of other serum enzymes: Secondary | ICD-10-CM | POA: Diagnosis not present

## 2020-07-26 DIAGNOSIS — Z6832 Body mass index (BMI) 32.0-32.9, adult: Secondary | ICD-10-CM | POA: Diagnosis not present

## 2020-07-26 DIAGNOSIS — R1031 Right lower quadrant pain: Secondary | ICD-10-CM | POA: Diagnosis not present

## 2020-07-26 DIAGNOSIS — I10 Essential (primary) hypertension: Secondary | ICD-10-CM | POA: Diagnosis not present

## 2020-07-26 DIAGNOSIS — F319 Bipolar disorder, unspecified: Secondary | ICD-10-CM | POA: Diagnosis not present

## 2020-08-05 ENCOUNTER — Other Ambulatory Visit: Payer: Self-pay

## 2020-08-05 ENCOUNTER — Ambulatory Visit: Payer: Medicare HMO | Admitting: Cardiology

## 2020-08-05 ENCOUNTER — Encounter: Payer: Self-pay | Admitting: Cardiology

## 2020-08-05 VITALS — BP 142/78 | HR 80 | Ht 75.0 in | Wt 216.2 lb

## 2020-08-05 DIAGNOSIS — Z8774 Personal history of (corrected) congenital malformations of heart and circulatory system: Secondary | ICD-10-CM

## 2020-08-05 DIAGNOSIS — E782 Mixed hyperlipidemia: Secondary | ICD-10-CM

## 2020-08-05 DIAGNOSIS — Z8673 Personal history of transient ischemic attack (TIA), and cerebral infarction without residual deficits: Secondary | ICD-10-CM | POA: Diagnosis not present

## 2020-08-05 DIAGNOSIS — I10 Essential (primary) hypertension: Secondary | ICD-10-CM | POA: Diagnosis not present

## 2020-08-05 DIAGNOSIS — F17210 Nicotine dependence, cigarettes, uncomplicated: Secondary | ICD-10-CM | POA: Diagnosis not present

## 2020-08-05 HISTORY — DX: Mixed hyperlipidemia: E78.2

## 2020-08-05 MED ORDER — OMEGA-3-ACID ETHYL ESTERS 1 G PO CAPS: 2.0000 g | ORAL_CAPSULE | Freq: Two times a day (BID) | ORAL | 12 refills | Status: DC

## 2020-08-05 NOTE — Patient Instructions (Addendum)
Medication Instructions:  Your physician has recommended you make the following change in your medication:   Take Lovaza 2 gms twice daily.  *If you need a refill on your cardiac medications before your next appointment, please call your pharmacy*   Lab Work: Your physician recommends that you return for lab work in: before your next visit. You need to have labs done when you are fasting.  You can come Monday through Friday 8:30 am to 12:00 pm and 1:15 to 4:30. You do not need to make an appointment as the order has already been placed. The labs you are going to have done are BMET, LFT and Lipids.  If you have labs (blood work) drawn today and your tests are completely normal, you will receive your results only by: Marland Kitchen MyChart Message (if you have MyChart) OR . A paper copy in the mail If you have any lab test that is abnormal or we need to change your treatment, we will call you to review the results.   Testing/Procedures: None ordered   Follow-Up: At [facility], you and your health needs are our priority.  As part of our continuing mission to provide you with exceptional heart care, we have created designated Provider Care Teams.  These Care Teams include your primary Cardiologist (physician) and Advanced Practice Providers (APPs -  Physician Assistants and Nurse Practitioners) who all work together to provide you with the care you need, when you need it.  We recommend signing up for the patient portal called "MyChart".  Sign up information is provided on this After Visit Summary.  MyChart is used to connect with patients for Virtual Visits (Telemedicine).  Patients are able to view lab/test results, encounter notes, upcoming appointments, etc.  Non-urgent messages can be sent to your provider as well.   To learn more about what you can do with MyChart, go to [email or web address].    Your next appointment:   2 month(s)  The format for your next appointment:   In  Person  Provider:   Jyl Heinz, MD   Other Instructions Fish Oil, Omega-3 Fatty Acids capsules (Rx) What is this medicine? FISH OIL, OMEGA-3 FATTY ACIDS (Fish Oil, oh MAY ga 3 fatty AS ids) lowers triglyceride levels in the blood. It is used with lifestyle changes, like diet and exercise. It is used alone or with other medicines. This medicine may be used for other purposes; ask your health care provider or pharmacist if you have questions. COMMON BRAND NAME(S): Lovaza, Magna Omega-3, Ocean Blue Nutritionals Omega-3 1450, Ocean Blue Omega, [place] Professional Omega-3 2100, [place], Omega-3, Ovega-3, Triklo What should I tell my health care provider before I take this medicine? They need to know if you have any of these conditions:  diabetes (high blood sugar)  irregular heartbeat or rhythm  liver disease  low thyroid levels  pancreatic disease  an unusual or allergic reaction to fish oil, omega-3 fatty acids, fish, shellfish, other medicines, foods, dyes, or preservatives  pregnant or trying to get pregnant  breast-feeding How should I use this medicine? Take this medicine by mouth. Take it as directed on the prescription label at the same time every day. Do not cut, crush or chew this medicine. Swallow the capsules whole. Take it with food. Keep taking it unless your health care provider tells you to stop Talk to your health care provider about the use of this medicine in children. Special care may be needed. Overdosage: If you think you have taken  too much of this medicine contact a poison control center or emergency room at once. NOTE: This medicine is only for you. Do not share this medicine with others. What if I miss a dose? If you miss a dose, take it as soon as you can. If it is almost time for your next dose, take only that dose. Do not take double or extra doses. What may interact with this medicine?  aspirin and aspirin-like medicines  herbal products like  danshen, dong quai, garlic pills, ginger, ginkgo biloba, horse chestnut, willow bark, and others  medicines that treat or prevent blood clots like enoxaparin, heparin, warfarin This list may not describe all possible interactions. Give your health care provider a list of all the medicines, herbs, non-prescription drugs, or dietary supplements you use. Also tell them if you smoke, drink alcohol, or use illegal drugs. Some items may interact with your medicine. What should I watch for while using this medicine? Visit your health care provider for regular checks on your progress. It may be some time before you see the benefit from this medicine. This medicine may increase your risk to bruise or bleed. Call your health care provider if you notice any unusual bleeding. You may need blood work while you are taking this medicine. Taking this medicine is only part of a total heart healthy program. Your health care provider may give you a special diet to follow. Avoid alcohol. Avoid smoking. Ask your health care provider how much you should exercise. What side effects may I notice from receiving this medicine? Side effects that you should report to your doctor or health care provider as soon as possible:  allergic reactions (skin rash, itching or hives; swelling of the face, lips, or tongue)  heartbeat rhythm changes (trouble breathing; chest pain; dizziness; fast, irregular heartbeat; feeling faint or lightheaded, falls)  unusual bruising or bleeding Side effects that usually do not require medical attention (report to your doctor or health care provider if they continue or are bothersome):  change in taste  diarrhea  nausea  stomach pain This list may not describe all possible side effects. Call your doctor for medical advice about side effects. You may report side effects to FDA at 1-800-FDA-1088. Where should I keep my medicine? Keep out of the reach of children and pets. Store at room  temperature between 15 and 30 degrees C (59 and 86 degrees F). Do not freeze. Get rid of any unused medicine after the expiration date. NOTE: This sheet is a summary. It may not cover all possible information. If you have questions about this medicine, talk to your doctor, pharmacist, or health care provider.  2021 Elsevier/Gold Standard (2019-05-02 18:54:16)   Fat and Cholesterol Restricted Eating Plan Getting too much fat and cholesterol in your diet may cause health problems. Choosing the right foods helps keep your fat and cholesterol at normal levels. This can keep you from getting certain diseases.  What are tips for following this plan? Meal planning  At meals, divide your plate into four equal parts: ? Fill one-half of your plate with vegetables and green salads. ? Fill one-fourth of your plate with whole grains. ? Fill one-fourth of your plate with low-fat (lean) protein foods.  Eat fish that is high in omega-3 fats at least two times a week. This includes mackerel, tuna, sardines, and salmon.  Eat foods that are high in fiber, such as whole grains, beans, apples, broccoli, carrots, peas, and barley. General tips  Work  with your doctor to lose weight if you need to.  Avoid: ? Foods with added sugar. ? Fried foods. ? Foods with partially hydrogenated oils.  Limit alcohol intake to no more than 1 drink a day for nonpregnant women and 2 drinks a day for men. One drink equals 12 oz of beer, 5 oz of wine, or 1 oz of hard liquor.   Reading food labels  Check food labels for: ? Trans fats. ? Partially hydrogenated oils. ? Saturated fat (g) in each serving. ? Cholesterol (mg) in each serving. ? Fiber (g) in each serving.  Choose foods with healthy fats, such as: ? Monounsaturated fats. ? Polyunsaturated fats. ? Omega-3 fats.  Choose grain products that have whole grains. Look for the word "whole" as the first word in the ingredient list. Cooking  Cook foods using  low-fat methods. These include baking, boiling, grilling, and broiling.  Eat more home-cooked foods. Eat at restaurants and buffets less often.  Avoid cooking using saturated fats, such as butter, cream, palm oil, palm kernel oil, and coconut oil. Recommended foods Fruits  All fresh, canned (in natural juice), or frozen fruits. Vegetables  Fresh or frozen vegetables (raw, steamed, roasted, or grilled). Green salads. Grains  Whole grains, such as whole wheat or whole grain breads, crackers, cereals, and pasta. Unsweetened oatmeal, bulgur, barley, quinoa, or [place] rice. Corn or whole wheat flour tortillas. Meats and other protein foods  Ground beef (85% or leaner), grass-fed beef, or beef trimmed of fat. Skinless chicken or [place]. Ground chicken or [place]. Pork trimmed of fat. All fish and seafood. Egg whites. Dried beans, peas, or lentils. Unsalted nuts or seeds. Unsalted canned beans. Nut butters without added sugar or oil. Dairy  Low-fat or nonfat dairy products, such as skim or 1% milk, 2% or reduced-fat cheeses, low-fat and fat-free ricotta or cottage cheese, or plain low-fat and nonfat yogurt. Fats and oils  Tub margarine without trans fats. Light or reduced-fat mayonnaise and salad dressings. Avocado. Olive, canola, sesame, or safflower oils. The items listed above may not be a complete list of foods and beverages you can eat. Contact a dietitian for more information.   Foods to avoid Fruits  Canned fruit in heavy syrup. Fruit in cream or butter sauce. Fried fruit. Vegetables  Vegetables cooked in cheese, cream, or butter sauce. Fried vegetables. Grains  White bread. White pasta. White rice. Cornbread. Bagels, pastries, and croissants. Crackers and snack foods that contain trans fat and hydrogenated oils. Meats and other protein foods  Fatty cuts of meat. Ribs, chicken wings, bacon, sausage, bologna, salami, chitterlings, fatback, hot dogs, bratwurst, and packaged lunch  meats. Liver and organ meats. Whole eggs and egg yolks. Chicken and [place] with skin. Fried meat. Dairy  Whole or 2% milk, cream, half-and-half, and cream cheese. Whole milk cheeses. Whole-fat or sweetened yogurt. Full-fat cheeses. Nondairy creamers and whipped toppings. Processed cheese, cheese spreads, and cheese curds. Beverages  Alcohol. Sugar-sweetened drinks such as sodas, lemonade, and fruit drinks. Fats and oils  Butter, stick margarine, lard, shortening, ghee, or bacon fat. Coconut, palm kernel, and palm oils. Sweets and desserts  Corn syrup, sugars, honey, and molasses. Candy. Jam and jelly. Syrup. Sweetened cereals. Cookies, pies, cakes, donuts, muffins, and ice cream. The items listed above may not be a complete list of foods and beverages you should avoid. Contact a dietitian for more information. Summary  Choosing the right foods helps keep your fat and cholesterol at normal levels. This can keep you  from getting certain diseases.  At meals, fill one-half of your plate with vegetables and green salads.  Eat high-fiber foods, like whole grains, beans, apples, carrots, peas, and barley.  Limit added sugar, saturated fats, alcohol, and fried foods. This information is not intended to replace advice given to you by your health care provider. Make sure you discuss any questions you have with your health care provider. Document Revised: 09/17/2019 Document Reviewed: 09/17/2019 Elsevier Patient Education  2021 [employer].

## 2020-08-05 NOTE — Progress Notes (Signed)
Cardiology Office Note:    Date:  08/05/2020   ID:  Roy Koch, DOB [birth year], MRN [id number]  PCP:  [name], PA-C  Cardiologist:  Jenean Lindau, MD   Referring MD: [name], PA-C    ASSESSMENT:    1. Essential hypertension   2. History of repair of congenital atrial septal defect (ASD)   3. History of TIA (transient ischemic attack)   4. Cigarette smoker   5. Mixed dyslipidemia    PLAN:    In order of problems listed above:  1. Coronary artery calcification: Secondary prevention stressed with the patient.  Importance of compliance with diet medication stressed and he vocalized understanding.  Lifestyle modification was urged. 2. Essential hypertension: Blood pressure stable and diet was emphasized.  Lifestyle modification urged and I told him to walk on a regular basis.  Salt intake issues were discussed. 3. Mixed hyperlipidemia: LFTs are elevated and I told the patient to be watchful of his diet.  He is not on statin therapy.  His LFTs I have been told by the patient have come back to normal and this is the information is received from his primary care provider.  In view of elevated triglycerides I will start him on Lovaza 2 g twice daily.  He will be back in 2 months for follow-up appointment before which patient will come for blood work. 4. Cigarette smoker: I spent 5 minutes with the patient discussing solely about smoking. Smoking cessation was counseled. I suggested to the patient also different medications and pharmacological interventions. Patient is keen to try stopping on its own at this time. He will get back to me if he needs any further assistance in this matter. 5. Patient will be seen in follow-up appointment in 2 months or earlier if the patient has any concerns    Medication Adjustments/Labs and Tests Ordered: Current medicines are reviewed at length with the patient today.  Concerns regarding medicines are outlined above.  No orders of the  defined types were placed in this encounter.  No orders of the defined types were placed in this encounter.    No chief complaint on file.    History of Present Illness:    Roy Koch is a 45 y.o. male.  Patient has past medical history of essential hypertension and elevated LFTs.  He is calcium score is significantly elevated.  He is not on statin therapy because of palpitations elevation.  He mentions to me that his primary care did blood work and his LFTs have come back to normal.  He denies any chest pain orthopnea or PND.  He takes care of activities of daily living.  At the time of my evaluation, the patient is alert awake oriented and in no distress.  Past Medical History:  Diagnosis Date  . Anxiety   . Bipolar disorder ([place])   . Bipolar disorder ([place])   . Cardiac abnormality   . Chest discomfort 06/18/2018  . Cigarette smoker 06/18/2018  . COPD (chronic obstructive pulmonary disease) ([place])   . Essential hypertension 06/18/2018  . Generalized anxiety disorder 09/15/2017  . Headache   . Heart defect, congenital   . History of COVID-19   . History of repair of congenital atrial septal defect (ASD) 06/18/2018  . History of TIA (transient ischemic attack)   . History of tobacco use   . Hypertension   . Hypertension, essential, benign   . Low back pain 07/14/2013  . Migraine headache   .  Neck pain   . Palpitations   . Stroke ([place])   . Tobacco abuse   . Ventricular septal defect     Past Surgical History:  Procedure Laterality Date  . AMPUTATION FINGER / THUMB    . CARDIAC SURGERY  at 45 years old    Current Medications: Current Meds  Medication Sig  . albuterol (PROVENTIL HFA;VENTOLIN HFA) 108 (90 Base) MCG/ACT inhaler Inhale 1-2 puffs into the lungs daily as needed for wheezing or shortness of breath.  Marland Kitchen aspirin 325 MG tablet Take 325 mg by mouth daily.   Marland Kitchen doxepin (SINEQUAN) 25 MG capsule Take 1 capsule (25 mg total) by mouth at bedtime.  . Garlic Oil 272 MG TABS  Take 250 mg by mouth daily.  Marland Kitchen lisinopril (ZESTRIL) 40 MG tablet Take 40 mg by mouth daily.  . SYMBICORT 160-4.5 MCG/ACT inhaler Inhale 2 puffs into the lungs 2 (two) times daily.     Allergies:   Patient has no known allergies.   Social History   Socioeconomic History  . Marital status: Divorced    Spouse name: Not on file  . Number of children: 2  . Years of education: Not on file  . Highest education level: 11th grade  Occupational History  . Not on file  Tobacco Use  . Smoking status: Current Every Day Smoker    Packs/day: 3.00    Types: Cigarettes  . Smokeless tobacco: Never Used  Vaping Use  . Vaping Use: Never used  Substance and Sexual Activity  . Alcohol use: No  . Drug use: No  . Sexual activity: Not on file  Other Topics Concern  . Not on file  Social History Narrative  . Not on file   Social Determinants of Health   Financial Resource Strain: Not on file  Food Insecurity: Not on file  Transportation Needs: Not on file  Physical Activity: Not on file  Stress: Not on file  Social Connections: Not on file     Family History: The patient's family history includes Bipolar disorder in his maternal grandmother and maternal uncle; Schizophrenia in his father.  ROS:   Please see the history of present illness.    All other systems reviewed and are negative.  EKGs/Labs/Other Studies Reviewed:    The following studies were reviewed today: I discussed findings with the patient at extensive length including calcium score.   Recent Labs: 07/16/2020: ALT 108; BUN 8; Creatinine, Ser 0.91; Potassium 3.9; Sodium 138  Recent Lipid Panel    Component Value Date/Time   CHOL 177 07/16/2020 1556   TRIG 248 (H) 07/16/2020 1556   HDL 23 (L) 07/16/2020 1556   CHOLHDL 7.7 (H) 07/16/2020 1556   LDLCALC 111 (H) 07/16/2020 1556    Physical Exam:    VS:  BP (!) 142/78   Pulse 80   Ht 6\' 3"  (1.905 m)   Wt 216 lb 3.2 oz (98.1 kg)   SpO2 98%   BMI 27.02 kg/m      Wt Readings from Last 3 Encounters:  08/05/20 216 lb 3.2 oz (98.1 kg)  06/22/20 232 lb 9.6 oz (105.5 kg)  03/11/20 230 lb 6.4 oz (104.5 kg)     GEN: Patient is in no acute distress HEENT: Normal NECK: No JVD; No carotid bruits LYMPHATICS: No lymphadenopathy CARDIAC: Hear sounds regular, 2/6 systolic murmur at the apex. RESPIRATORY:  Clear to auscultation without rales, wheezing or rhonchi  ABDOMEN: Soft, non-tender, non-distended MUSCULOSKELETAL:  No edema; No deformity  SKIN: Warm and dry NEUROLOGIC:  Alert and oriented x 3 PSYCHIATRIC:  Normal affect   Signed, Jenean Lindau, MD  08/05/2020 10:58 AM    [place]

## 2020-09-06 DIAGNOSIS — R748 Abnormal levels of other serum enzymes: Secondary | ICD-10-CM | POA: Diagnosis not present

## 2020-09-06 DIAGNOSIS — E781 Pure hyperglyceridemia: Secondary | ICD-10-CM | POA: Diagnosis not present

## 2020-09-06 DIAGNOSIS — Z87891 Personal history of nicotine dependence: Secondary | ICD-10-CM | POA: Diagnosis not present

## 2020-09-06 DIAGNOSIS — Z6832 Body mass index (BMI) 32.0-32.9, adult: Secondary | ICD-10-CM | POA: Diagnosis not present

## 2020-09-06 DIAGNOSIS — J449 Chronic obstructive pulmonary disease, unspecified: Secondary | ICD-10-CM | POA: Diagnosis not present

## 2020-09-06 DIAGNOSIS — I10 Essential (primary) hypertension: Secondary | ICD-10-CM | POA: Diagnosis not present

## 2020-10-01 ENCOUNTER — Other Ambulatory Visit: Payer: Self-pay

## 2020-10-11 ENCOUNTER — Other Ambulatory Visit: Payer: Self-pay

## 2020-10-11 ENCOUNTER — Encounter: Payer: Self-pay | Admitting: Cardiology

## 2020-10-11 ENCOUNTER — Ambulatory Visit: Payer: Medicare HMO | Admitting: Cardiology

## 2020-10-11 VITALS — BP 142/98 | HR 71 | Ht 72.0 in | Wt 230.2 lb

## 2020-10-11 DIAGNOSIS — I10 Essential (primary) hypertension: Secondary | ICD-10-CM | POA: Diagnosis not present

## 2020-10-11 DIAGNOSIS — R072 Precordial pain: Secondary | ICD-10-CM

## 2020-10-11 DIAGNOSIS — E782 Mixed hyperlipidemia: Secondary | ICD-10-CM

## 2020-10-11 DIAGNOSIS — R0789 Other chest pain: Secondary | ICD-10-CM

## 2020-10-11 DIAGNOSIS — F17210 Nicotine dependence, cigarettes, uncomplicated: Secondary | ICD-10-CM

## 2020-10-11 HISTORY — DX: Other chest pain: R07.89

## 2020-10-11 MED ORDER — AMLODIPINE BESYLATE 5 MG PO TABS: 5.0000 mg | ORAL_TABLET | Freq: Every day | ORAL | 3 refills | Status: DC

## 2020-10-11 MED ORDER — NITROGLYCERIN 0.4 MG SL SUBL: 0.4000 mg | SUBLINGUAL_TABLET | SUBLINGUAL | 6 refills | Status: DC | PRN

## 2020-10-11 NOTE — Progress Notes (Signed)
Cardiology Office Note:    Date:  10/11/2020   ID:  [name], DOB 1976-01-15, MRN [id number]  PCP:  [name], PA-C  Cardiologist:  Jenean Lindau, MD   Referring MD: [name], PA-C    ASSESSMENT:    1. Essential hypertension   2. Cigarette smoker   3. Mixed dyslipidemia    PLAN:    In order of problems listed above:  1. Elevated calcium score: Secondary prevention stressed with patient.  Importance of compliance with diet medication stressed any vocalized understanding. 2. Chest tightness: This is concerning in view of risk factors.  Following recommendations were given to him.  Sublingual nitroglycerin prescription was sent, its protocol and 911 protocol explained and the patient vocalized understanding questions were answered to the patient's satisfaction.  Patient will undergo Lexiscan sestamibi evaluation for this finding.  Patient will cut down aspirin to coated aspirin 81 mg daily. 3. Essential hypertension: Blood pressure is elevated and diet, lifestyle modification was urged.  I have added amlodipine 5 mg daily and he will keep a track of his blood pressures and will be seen in follow-up appointment in a month. 4. Mixed dyslipidemia: Diet emphasized.  He does not want any lipid-lowering medications I respect his wishes. 5. Cigarette smoker: I spent 5 minutes with the patient discussing solely about smoking. Smoking cessation was counseled. I suggested to the patient also different medications and pharmacological interventions. Patient is keen to try stopping on its own at this time. He will get back to me if he needs any further assistance in this matter.   Medication Adjustments/Labs and Tests Ordered: Current medicines are reviewed at length with the patient today.  Concerns regarding medicines are outlined above.  No orders of the defined types were placed in this encounter.  No orders of the defined types were placed in this encounter.    No chief  complaint on file.    History of Present Illness:    NTHONY Koch is a 45 y.o. male.  Patient has past medical history of essential hypertension, dyslipidemia and elevated calcium score.  He leads a sedentary lifestyle.  Unfortunately continues to smoke.  His wife accompanies him for this visit.  At the time of my evaluation, the patient is alert awake oriented and in no distress.  He is not on statin therapy because of history of elevated LFTs in the past.  He mentions to me that his doctors have told him not to take any medication that can affect his liver.  At the time of my evaluation, the patient is alert awake oriented and in no distress.  Patient mentions to me that he has occasionally chest tightness radiating to both arms.  This happens sporadically.  He does not tell me that it happens on exertion.  He is sexually active and sexual activity does not bring around the symptoms consistently.  Past Medical History:  Diagnosis Date  . Anxiety   . Bipolar disorder ([place])   . Bipolar disorder ([place])   . Cardiac abnormality   . Chest discomfort 06/18/2018  . Cigarette smoker 06/18/2018  . COPD (chronic obstructive pulmonary disease) ([place])   . Essential hypertension 06/18/2018  . Generalized anxiety disorder 09/15/2017  . Headache   . Heart defect, congenital   . History of COVID-19   . History of repair of congenital atrial septal defect (ASD) 06/18/2018  . History of TIA (transient ischemic attack)   . History of tobacco use   .  Hypertension   . Hypertension, essential, benign   . Low back pain 07/14/2013  . Migraine headache   . Mixed dyslipidemia 08/05/2020  . Neck pain   . Palpitations   . Stroke ([place])   . Tobacco abuse   . Ventricular septal defect     Past Surgical History:  Procedure Laterality Date  . AMPUTATION FINGER / THUMB    . CARDIAC SURGERY  at 45 years old    Current Medications: Current Meds  Medication Sig  . albuterol (PROVENTIL HFA;VENTOLIN HFA) 108 (90  Base) MCG/ACT inhaler Inhale 1-2 puffs into the lungs daily as needed for wheezing or shortness of breath.  Marland Kitchen aspirin 325 MG tablet Take 325 mg by mouth daily.   . cholecalciferol (VITAMIN D3) 25 MCG (1000 UNIT) tablet Take 1,000 Units by mouth daily.  . Garlic Oil 782 MG TABS Take 250 mg by mouth daily.  Marland Kitchen lisinopril (ZESTRIL) 40 MG tablet Take 20 mg by mouth daily.  Marland Kitchen omega-3 acid ethyl esters (LOVAZA) 1 g capsule Take 2 capsules (2 g total) by mouth 2 (two) times daily.  . SYMBICORT 160-4.5 MCG/ACT inhaler Inhale 2 puffs into the lungs 2 (two) times daily.     Allergies:   Patient has no known allergies.   Social History   Socioeconomic History  . Marital status: Divorced    Spouse name: Not on file  . Number of children: 2  . Years of education: Not on file  . Highest education level: 11th grade  Occupational History  . Not on file  Tobacco Use  . Smoking status: Current Every Day Smoker    Packs/day: 3.00    Types: Cigarettes  . Smokeless tobacco: Never Used  Vaping Use  . Vaping Use: Never used  Substance and Sexual Activity  . Alcohol use: No  . Drug use: No  . Sexual activity: Not on file  Other Topics Concern  . Not on file  Social History Narrative  . Not on file   Social Determinants of Health   Financial Resource Strain: Not on file  Food Insecurity: Not on file  Transportation Needs: Not on file  Physical Activity: Not on file  Stress: Not on file  Social Connections: Not on file     Family History: The patient's family history includes Bipolar disorder in his maternal grandmother and maternal uncle; Schizophrenia in his father.  ROS:   Please see the history of present illness.    All other systems reviewed and are negative.  EKGs/Labs/Other Studies Reviewed:    The following studies were reviewed today: EKG reveals sinus rhythm and nonspecific ST-T changes.   CLINICAL DATA:  Risk stratification 45 year old with  hyperlipidemia.  EXAM: Coronary Calcium Score  TECHNIQUE: The patient was scanned on a [employer]. Axial non-contrast 3 mm slices were carried out through the heart. The data set was analyzed on a dedicated work station and scored using the [place].  FINDINGS: Non-cardiac: See separate report from [facility].  Ascending Aorta: Para aortic calcified lymph node adjacent to ascending aorta noted (see radiology report). No atherosclerosis.  Pericardium: Normal  Coronary arteries: Mild focal LAD calcification.  IMPRESSION: 1. Coronary calcium score of 13. This was 37 percentile for age and sex matched control.  Candee Furbish, MD [facility]   Electronically Signed   By: Candee Furbish MD   On: 07/07/2020 15:38   Recent Labs: 07/16/2020: ALT 108; BUN 8; Creatinine, Ser 0.91; Potassium 3.9; Sodium  138  Recent Lipid Panel    Component Value Date/Time   CHOL 177 07/16/2020 1556   TRIG 248 (H) 07/16/2020 1556   HDL 23 (L) 07/16/2020 1556   CHOLHDL 7.7 (H) 07/16/2020 1556   LDLCALC 111 (H) 07/16/2020 1556    Physical Exam:    VS:  BP (!) 142/98   Pulse 71   Ht 6' (1.829 m)   Wt 230 lb 3.2 oz (104.4 kg)   SpO2 98%   BMI 31.22 kg/m     Wt Readings from Last 3 Encounters:  10/11/20 230 lb 3.2 oz (104.4 kg)  08/05/20 216 lb 3.2 oz (98.1 kg)  06/22/20 232 lb 9.6 oz (105.5 kg)     GEN: Patient is in no acute distress HEENT: Normal NECK: No JVD; No carotid bruits LYMPHATICS: No lymphadenopathy CARDIAC: Hear sounds regular, 2/6 systolic murmur at the apex. RESPIRATORY:  Clear to auscultation without rales, wheezing or rhonchi  ABDOMEN: Soft, non-tender, non-distended MUSCULOSKELETAL:  No edema; No deformity  SKIN: Warm and dry NEUROLOGIC:  Alert and oriented x 3 PSYCHIATRIC:  Normal affect   Signed, Jenean Lindau, MD  10/11/2020 10:53 AM    [place]

## 2020-10-11 NOTE — Patient Instructions (Addendum)
Medication Instructions:  Your physician has recommended you make the following change in your medication:   Decrease your aspirin to 81 mg daily. Take Amlodipine 5 mg daily. Use Nitroglycerin as needed for chest pain.  *If you need a refill on your cardiac medications before your next appointment, please call your pharmacy*   Lab Work: None ordered If you have labs (blood work) drawn today and your tests are completely normal, you will receive your results only by: Marland Kitchen MyChart Message (if you have MyChart) OR . A paper copy in the mail If you have any lab test that is abnormal or we need to change your treatment, we will call you to review the results.   Testing/Procedures: Your physician has requested that you have a lexiscan myoview. For further information please visit [email or web address]. Please follow instruction sheet, as given.  The test will take approximately 3 to 4 hours to complete; you may bring reading material.  If someone comes with you to your appointment, they will need to remain in the main lobby due to limited space in the testing area. **If you are pregnant or breastfeeding, please notify the nuclear lab prior to your appointment**  How to prepare for your Myocardial Perfusion Test: . Do not eat or drink 3 hours prior to your test, except you may have water. . Do not consume products containing caffeine (regular or decaffeinated) 12 hours prior to your test. (ex: coffee, chocolate, sodas, tea). . Do bring a list of your current medications with you.  If not listed below, you may take your medications as normal. . Do wear comfortable clothes (no dresses or overalls) and walking shoes, tennis shoes preferred (No heels or open toe shoes are allowed). . Do NOT wear cologne, perfume, aftershave, or lotions (deodorant is allowed). . If these instructions are not followed, your test will have to be rescheduled.    Follow-Up: At [facility], you and your health needs  are our priority.  As part of our continuing mission to provide you with exceptional heart care, we have created designated Provider Care Teams.  These Care Teams include your primary Cardiologist (physician) and Advanced Practice Providers (APPs -  Physician Assistants and Nurse Practitioners) who all work together to provide you with the care you need, when you need it.  We recommend signing up for the patient portal called "MyChart".  Sign up information is provided on this After Visit Summary.  MyChart is used to connect with patients for Virtual Visits (Telemedicine).  Patients are able to view lab/test results, encounter notes, upcoming appointments, etc.  Non-urgent messages can be sent to your provider as well.   To learn more about what you can do with MyChart, go to [email or web address].    Your next appointment:   2 month(s)  The format for your next appointment:   In Person  Provider:   Jyl Heinz, MD   Other Instructions Cardiac Nuclear Scan A cardiac nuclear scan is a test that is done to check the flow of blood to your heart. It is done when you are resting and when you are exercising. The test looks for problems such as:  Not enough blood reaching a portion of the heart.  The heart muscle not working as it should. You may need this test if:  You have heart disease.  You have had lab results that are not normal.  You have had heart surgery or a balloon procedure to open up blocked arteries (angioplasty).  You  have chest pain.  You have shortness of breath. In this test, a special dye (tracer) is put into your bloodstream. The tracer will travel to your heart. A camera will then take pictures of your heart to see how the tracer moves through your heart. This test is usually done at a hospital and takes 2-4 hours. Tell a doctor about:  Any allergies you have.  All medicines you are taking, including vitamins, herbs, eye drops, creams, and over-the-counter  medicines.  Any problems you or family members have had with anesthetic medicines.  Any blood disorders you have.  Any surgeries you have had.  Any medical conditions you have.  Whether you are pregnant or may be pregnant. What are the risks? Generally, this is a safe test. However, problems may occur, such as:  Serious chest pain and heart attack. This is only a risk if the stress portion of the test is done.  Rapid heartbeat.  A feeling of warmth in your chest. This feeling usually does not last long.  Allergic reaction to the tracer. What happens before the test?  Ask your doctor about changing or stopping your normal medicines. This is important.  Follow instructions from your doctor about what you cannot eat or drink.  Remove your jewelry on the day of the test. What happens during the test? 1. An IV tube will be inserted into one of your veins. 2. Your doctor will give you a small amount of tracer through the IV tube. 3. You will wait for 20-40 minutes while the tracer moves through your bloodstream. 4. Your heart will be monitored with an electrocardiogram (ECG). 5. You will lie down on an exam table. 6. Pictures of your heart will be taken for about 15-20 minutes. 7. You may also have a stress test. For this test, one of these things may be done: ? You will be asked to exercise on a treadmill or a stationary bike. ? You will be given medicines that will make your heart work harder. This is done if you are unable to exercise. 8. When blood flow to your heart has peaked, a tracer will again be given through the IV tube. 9. After 20-40 minutes, you will get back on the exam table. More pictures will be taken of your heart. 10. Depending on the tracer that is used, more pictures may need to be taken 3-4 hours later. 11. Your IV tube will be removed when the test is over. The test may vary among doctors and hospitals. What happens after the test? 1. Ask your  doctor: ? Whether you can return to your normal schedule, including diet, activities, and medicines. ? Whether you should drink more fluids. This will help to remove the tracer from your body. Drink enough fluid to keep your pee (urine) pale yellow. 2. Ask your doctor, or the department that is doing the test: ? When will my results be ready? ? How will I get my results? Summary  A cardiac nuclear scan is a test that is done to check the flow of blood to your heart.  Tell your doctor whether you are pregnant or may be pregnant.  Before the test, ask your doctor about changing or stopping your normal medicines. This is important.  Ask your doctor whether you can return to your normal activities. You may be asked to drink more fluids. This information is not intended to replace advice given to you by your health care provider. Make sure you discuss  any questions you have with your health care provider. Document Revised: 09/04/2018 Document Reviewed: 10/29/2017 Elsevier Patient Education  2021 [place].  Nitroglycerin sublingual tablets What is this medicine? NITROGLYCERIN (nye troe GLI ser in) is a type of vasodilator. It relaxes blood vessels, increasing the blood and oxygen supply to your heart. This medicine is used to relieve chest pain caused by angina. It is also used to prevent chest pain before activities like climbing stairs, going outdoors in cold weather, or sexual activity. This medicine may be used for other purposes; ask your health care provider or pharmacist if you have questions. COMMON BRAND NAME(S): Nitroquick, Nitrostat, Nitrotab What should I tell my health care provider before I take this medicine? They need to know if you have any of these conditions:  anemia  head injury, recent stroke, or bleeding in the brain  liver disease  previous heart attack  an unusual or allergic reaction to nitroglycerin, other medicines, foods, dyes, or  preservatives  pregnant or trying to get pregnant  breast-feeding How should I use this medicine? Take this medicine by mouth as needed. Use at the first sign of an angina attack (chest pain or tightness). You can also take this medicine 5 to 10 minutes before an event likely to produce chest pain. Follow the directions exactly as written on the prescription label. Place one tablet under your tongue and let it dissolve. Do not swallow whole. Replace the dose if you accidentally swallow it. It will help if your mouth is not dry. Saliva around the tablet will help it to dissolve more quickly. Do not eat or drink, smoke or chew tobacco while a tablet is dissolving. Sit down when taking this medicine. In an angina attack, you should feel better within 5 minutes after your first dose. You can take a dose every 5 minutes up to a total of 3 doses. If you do not feel better or feel worse after 1 dose, call 9-1-1 at once. Do not take more than 3 doses in 15 minutes. Your health care provider might give you other directions. Follow those directions if he or she does. Do not take your medicine more often than directed. Talk to your health care provider about the use of this medicine in children. Special care may be needed. Overdosage: If you think you have taken too much of this medicine contact a poison control center or emergency room at once. NOTE: This medicine is only for you. Do not share this medicine with others. What if I miss a dose? This does not apply. This medicine is only used as needed. What may interact with this medicine? Do not take this medicine with any of the following medications:  certain migraine medicines like ergotamine and dihydroergotamine (DHE)  medicines used to treat erectile dysfunction like sildenafil, tadalafil, and vardenafil  riociguat This medicine may also interact with the following medications:  alteplase  aspirin  heparin  medicines for high blood  pressure  medicines for mental depression  other medicines used to treat angina  phenothiazines like chlorpromazine, mesoridazine, prochlorperazine, thioridazine This list may not describe all possible interactions. Give your health care provider a list of all the medicines, herbs, non-prescription drugs, or dietary supplements you use. Also tell them if you smoke, drink alcohol, or use illegal drugs. Some items may interact with your medicine. What should I watch for while using this medicine? Tell your doctor or health care professional if you feel your medicine is no longer working.  Keep this medicine with you at all times. Sit or lie down when you take your medicine to prevent falling if you feel dizzy or faint after using it. Try to remain calm. This will help you to feel better faster. If you feel dizzy, take several deep breaths and lie down with your feet propped up, or bend forward with your head resting between your knees. You may get drowsy or dizzy. Do not drive, use machinery, or do anything that needs mental alertness until you know how this drug affects you. Do not stand or sit up quickly, especially if you are an older patient. This reduces the risk of dizzy or fainting spells. Alcohol can make you more drowsy and dizzy. Avoid alcoholic drinks. Do not treat yourself for coughs, colds, or pain while you are taking this medicine without asking your doctor or health care professional for advice. Some ingredients may increase your blood pressure. What side effects may I notice from receiving this medicine? Side effects that you should report to your doctor or health care professional as soon as possible:  allergic reactions (skin rash, itching or hives; swelling of the face, lips, or tongue)  low blood pressure (dizziness; feeling faint or lightheaded, falls; unusually weak or tired)  low red blood cell counts (trouble breathing; feeling faint; lightheaded, falls; unusually weak or  tired) Side effects that usually do not require medical attention (report to your doctor or health care professional if they continue or are bothersome):  facial flushing (redness)  headache  nausea, vomiting This list may not describe all possible side effects. Call your doctor for medical advice about side effects. You may report side effects to FDA at 1-800-FDA-1088. Where should I keep my medicine? Keep out of the reach of children. Store at room temperature between 20 and 25 degrees C (68 and 77 degrees F). Store in [employer]. Protect from light and moisture. Keep tightly closed. Throw away any unused medicine after the expiration date. NOTE: This sheet is a summary. It may not cover all possible information. If you have questions about this medicine, talk to your doctor, pharmacist, or health care provider.  2021 Elsevier/Gold Standard (2018-02-13 16:46:32)  Amlodipine Tablets What is this medicine? AMLODIPINE (am LOE di peen) is a calcium channel blocker. It relaxes your blood vessels and decreases the amount of work the heart has to do. It treats high blood pressure and/or prevents chest pain (also called angina). This medicine may be used for other purposes; ask your health care provider or pharmacist if you have questions. COMMON BRAND NAME(S): Norvasc What should I tell my health care provider before I take this medicine? They need to know if you have any of these conditions:  heart disease  liver disease  an unusual or allergic reaction to amlodipine, other drugs, foods, dyes, or preservatives  pregnant or trying to get pregnant  breast-feeding How should I use this medicine? Take this medicine by mouth. Take it as directed on the prescription label at the same time every day. You can take it with or without food. If it upsets your stomach, take it with food. Keep taking it unless your health care provider tells you to stop. Talk to your health care provider  about the use of this medicine in children. While it may be prescribed for children as young as 6 for selected conditions, precautions do apply. Overdosage: If you think you have taken too much of this medicine contact a poison control center or  emergency room at once. NOTE: This medicine is only for you. Do not share this medicine with others. What if I miss a dose? If you miss a dose, take it as soon as you can. If it is almost time for your next dose, take only that dose. Do not take double or extra doses. What may interact with this medicine? This medicine may interact with the following medications:  clarithromycin  cyclosporine  diltiazem  itraconazole  simvastatin  tacrolimus This list may not describe all possible interactions. Give your health care provider a list of all the medicines, herbs, non-prescription drugs, or dietary supplements you use. Also tell them if you smoke, drink alcohol, or use illegal drugs. Some items may interact with your medicine. What should I watch for while using this medicine? Visit your health care provider for regular checks on your progress. Check your blood pressure as directed. Ask your health care provider what your blood pressure should be. Also, find out when you should contact him or her. Do not treat yourself for coughs, colds, or pain while you are using this medicine without asking your health care provider for advice. Some medicines may increase your blood pressure. You may get drowsy or dizzy. Do not drive, use machinery, or do anything that needs mental alertness until you know how this medicine affects you. Do not stand up or sit up quickly, especially if you are an older patient. This reduces the risk of dizzy or fainting spells. Alcohol can make you more drowsy and dizzy. Avoid alcoholic drinks. What side effects may I notice from receiving this medicine? Side effects that you should report to your doctor or health care provider as soon  as possible:  allergic reactions (skin rash, itching or hives; swelling of the face, lips, or tongue)  heart attack (trouble breathing; pain or tightness in the chest, neck, back or arms; unusually weak or tired)  low blood pressure (dizziness; feeling faint or lightheaded, falls; unusually weak or tired) Side effects that usually do not require medical attention (report these to your doctor or health care provider if they continue or are bothersome):  facial flushing  nausea  palpitations  stomach pain  sudden weight gain  swelling of the ankles, feet, hands This list may not describe all possible side effects. Call your doctor for medical advice about side effects. You may report side effects to FDA at 1-800-FDA-1088. Where should I keep my medicine? Keep out of the reach of children and pets. Store at room temperature between 20 and 25 degrees C (68 and 77 degrees F). Protect from light and moisture. Keep the container tightly closed. Get rid of any unused medicine after the expiration date. To get rid of medicines that are no longer needed or have expired:  Take the medicine to a medicine take-back program. Check with your pharmacy or law enforcement to find a location.  If you cannot return the medicine, check the label or package insert to see if the medicine should be thrown out in the garbage or flushed down the toilet. If you are not sure, ask your health care provider. If it is safe to put in the trash, empty the medicine out of the container. Mix the medicine with cat litter, dirt, coffee grounds, or other unwanted substance. Seal the mixture in a bag or container. Put it in the trash. NOTE: This sheet is a summary. It may not cover all possible information. If you have questions about this medicine, talk  to your doctor, pharmacist, or health care provider.  2021 Elsevier/Gold Standard (2020-04-10 14:59:47)

## 2020-10-19 ENCOUNTER — Telehealth (HOSPITAL_COMMUNITY): Payer: Medicare HMO | Admitting: Psychiatry

## 2020-10-19 DIAGNOSIS — J449 Chronic obstructive pulmonary disease, unspecified: Secondary | ICD-10-CM | POA: Diagnosis not present

## 2020-10-19 DIAGNOSIS — Z6832 Body mass index (BMI) 32.0-32.9, adult: Secondary | ICD-10-CM | POA: Diagnosis not present

## 2020-10-19 DIAGNOSIS — E785 Hyperlipidemia, unspecified: Secondary | ICD-10-CM | POA: Diagnosis not present

## 2020-10-19 DIAGNOSIS — Z0000 Encounter for general adult medical examination without abnormal findings: Secondary | ICD-10-CM | POA: Diagnosis not present

## 2020-10-19 DIAGNOSIS — I10 Essential (primary) hypertension: Secondary | ICD-10-CM | POA: Diagnosis not present

## 2020-10-19 DIAGNOSIS — Z23 Encounter for immunization: Secondary | ICD-10-CM | POA: Diagnosis not present

## 2020-10-20 ENCOUNTER — Telehealth: Payer: Self-pay | Admitting: *Deleted

## 2020-10-20 NOTE — Telephone Encounter (Signed)
Patient given detailed instructions per Myocardial Perfusion Study Information Sheet for the test on 10/26/20 at 0815. Patient notified to arrive 15 minutes early and that it is imperative to arrive on time for appointment to keep from having the test rescheduled.  If you need to cancel or reschedule your appointment, please call the office within 24 hours of your appointment. . Patient verbalized understanding.Rikayla Demmon, Ranae [place] No mychart

## 2020-10-26 ENCOUNTER — Other Ambulatory Visit: Payer: Self-pay

## 2020-10-26 ENCOUNTER — Ambulatory Visit (INDEPENDENT_AMBULATORY_CARE_PROVIDER_SITE_OTHER): Payer: Medicare HMO

## 2020-10-26 VITALS — Ht 72.0 in | Wt 230.0 lb

## 2020-10-26 DIAGNOSIS — F17210 Nicotine dependence, cigarettes, uncomplicated: Secondary | ICD-10-CM

## 2020-10-26 DIAGNOSIS — R072 Precordial pain: Secondary | ICD-10-CM | POA: Diagnosis not present

## 2020-10-26 DIAGNOSIS — R0789 Other chest pain: Secondary | ICD-10-CM

## 2020-10-26 DIAGNOSIS — E782 Mixed hyperlipidemia: Secondary | ICD-10-CM

## 2020-10-26 DIAGNOSIS — R110 Nausea: Secondary | ICD-10-CM

## 2020-10-26 DIAGNOSIS — I10 Essential (primary) hypertension: Secondary | ICD-10-CM | POA: Diagnosis not present

## 2020-10-26 LAB — MYOCARDIAL PERFUSION IMAGING
LV dias vol: 153 mL (ref 62–150)
LV sys vol: 76 mL
Peak HR: 114 {beats}/min
Rest HR: 55 {beats}/min
SDS: 3
SRS: 0
SSS: 3
TID: 1.01

## 2020-10-26 MED ORDER — TECHNETIUM TC 99M TETROFOSMIN IV KIT
31.3000 | PACK | Freq: Once | INTRAVENOUS | Status: AC | PRN
  Administered 2020-10-26: 31.3 via INTRAVENOUS

## 2020-10-26 MED ORDER — REGADENOSON 0.4 MG/5ML IV SOLN
0.4000 mg | Freq: Once | INTRAVENOUS | Status: AC
  Administered 2020-10-26: 0.4 mg via INTRAVENOUS

## 2020-10-26 MED ORDER — AMINOPHYLLINE 25 MG/ML IV SOLN
75.0000 mg | Freq: Once | INTRAVENOUS | Status: AC
  Administered 2020-10-26: 75 mg via INTRAVENOUS

## 2020-10-26 MED ORDER — TECHNETIUM TC 99M TETROFOSMIN IV KIT
10.1000 | PACK | Freq: Once | INTRAVENOUS | Status: AC | PRN
  Administered 2020-10-26: 10.1 via INTRAVENOUS

## 2020-10-26 NOTE — Progress Notes (Unsigned)
Patient stated he had an episode of chest pain with L arm numbness and could not feel his pulse. He took a NTG and it went away. He stated he did not go to ER. He wanted to ask his Dr if ok to do test. Dr. Bettina Gavia is DOB and wanted an EKG. This was performed and shown to Dr. Dr. Bettina Gavia ok patient to have test.

## 2020-10-29 ENCOUNTER — Other Ambulatory Visit: Payer: Self-pay

## 2020-10-29 ENCOUNTER — Telehealth (INDEPENDENT_AMBULATORY_CARE_PROVIDER_SITE_OTHER): Payer: Medicare HMO | Admitting: Psychiatry

## 2020-10-29 DIAGNOSIS — F0150 Vascular dementia without behavioral disturbance: Secondary | ICD-10-CM

## 2020-10-29 DIAGNOSIS — F0151 Vascular dementia with behavioral disturbance: Secondary | ICD-10-CM

## 2020-10-29 DIAGNOSIS — R454 Irritability and anger: Secondary | ICD-10-CM | POA: Diagnosis not present

## 2020-10-29 MED ORDER — CARBAMAZEPINE ER 100 MG PO TB12: ORAL_TABLET | ORAL | 4 refills | Status: DC

## 2020-10-29 MED ORDER — ALPRAZOLAM 0.5 MG PO TABS: ORAL_TABLET | ORAL | 3 refills | Status: DC

## 2020-10-29 NOTE — Progress Notes (Signed)
BH MD/PA/NP OP Progress Note  10/29/2020 8:49 AM Roy Koch  MRN:  [id number]  Chief Complaint: Irritability  Today the patient is doing only fairly well.  A few things happen medically.  The first thing is about 2 months ago he says his liver enzymes were abnormally increased and he claims his primary care doctor stopped all his medicines.  In reality I spoke today to his primary care provider Roy Koch who says he does not remember doing that much at all.  He thinks perhaps a cardiologist might have done this but nonetheless the patient has been off of his Tegretol.  He has had his blood work rechecked and they are back down to normal.  His primary care physician provider does not mind if he goes back on his Tegretol and I think that would be the best thing to do.  Today I also spoke to the patient's wife Roy Koch.  While she does not notice a big difference in him since being off of the Tegretol I think over the years it has been evident that the medicine has helped him.  The patient is has issues with impulsive.  He has had.  Treatment explosive and very violent.  Therefore I think at this time make sense to get back to Tegretol 100 mg twice daily.  Also asked him to get blood work at his primary care doctor in 1 month.  We will get a Tegretol level to get a comprehensive metabolic panel to recheck his liver enzymes.  It should also be noted that last week he says he had a myocardial infarction.  Speaking to his primary care provider and reviewing their charts he apparently did not have good evidence that he had a heart attack.  He has had 8 stress test just recently that demonstrated minimal to no evidence of cardiac disease.  Noted to the patient smokes 3 packs of cigarettes a day but he says he has cut down now since he has had this chest pain around the time he thought he had a heart attack.  Nonetheless the patient is sleeping and eating fairly well.  He has a lot of issues with his adult children.   This patient was seen in person in 3 months.  The patient also has been utilizing a small amount of Xanax from his wife which is the only thing he has had to help him sleep.   Visit Diagnosis: Mild neurocognitive disorder    Past Medical History:  Past Medical History:  Diagnosis Date  . Anxiety   . Bipolar disorder ([place])   . Bipolar disorder ([place])   . Cardiac abnormality   . Chest discomfort 06/18/2018  . Cigarette smoker 06/18/2018  . COPD (chronic obstructive pulmonary disease) ([place])   . Essential hypertension 06/18/2018  . Generalized anxiety disorder 09/15/2017  . Headache   . Heart defect, congenital   . History of COVID-19   . History of repair of congenital atrial septal defect (ASD) 06/18/2018  . History of TIA (transient ischemic attack)   . History of tobacco use   . Hypertension   . Hypertension, essential, benign   . Low back pain 07/14/2013  . Migraine headache   . Mixed dyslipidemia 08/05/2020  . Neck pain   . Palpitations   . Stroke ([place])   . Tobacco abuse   . Ventricular septal defect     Past Surgical History:  Procedure Laterality Date  . AMPUTATION FINGER / THUMB    .  CARDIAC SURGERY  at 45 years old    Family Psychiatric History: See intake H&P for full details. Reviewed, with no updates at this time.   Family History:  Family History  Problem Relation Age of Onset  . Schizophrenia Father   . Bipolar disorder Maternal Uncle   . Bipolar disorder Maternal Grandmother     Social History:  Social History   Socioeconomic History  . Marital status: Divorced    Spouse name: Not on file  . Number of children: 2  . Years of education: Not on file  . Highest education level: 11th grade  Occupational History  . Not on file  Tobacco Use  . Smoking status: Current Every Day Smoker    Packs/day: 3.00    Types: Cigarettes  . Smokeless tobacco: Never Used  Vaping Use  . Vaping Use: Never used  Substance and Sexual Activity  . Alcohol use: No  .  Drug use: No  . Sexual activity: Not on file  Other Topics Concern  . Not on file  Social History Narrative  . Not on file   Social Determinants of Health   Financial Resource Strain: Not on file  Food Insecurity: Not on file  Transportation Needs: Not on file  Physical Activity: Not on file  Stress: Not on file  Social Connections: Not on file    Allergies: No Known Allergies  Metabolic Disorder Labs: No results found for: HGBA1C, MPG No results found for: PROLACTIN Lab Results  Component Value Date   CHOL 177 07/16/2020   TRIG 248 (H) 07/16/2020   HDL 23 (L) 07/16/2020   CHOLHDL 7.7 (H) 07/16/2020   LDLCALC 111 (H) 07/16/2020   No results found for: TSH  Therapeutic Level Labs: No results found for: LITHIUM No results found for: VALPROATE No components found for:  CBMZ  Current Medications: Current Outpatient Medications  Medication Sig Dispense Refill  . ALPRAZolam (XANAX) 0.5 MG tablet 1  qhs 30 tablet 3  . carbamazepine (TEGRETOL-XR) 100 MG 12 hr tablet 1  bid 60 tablet 4  . albuterol (PROVENTIL HFA;VENTOLIN HFA) 108 (90 Base) MCG/ACT inhaler Inhale 1-2 puffs into the lungs daily as needed for wheezing or shortness of breath.    Marland Kitchen amLODipine (NORVASC) 5 MG tablet Take 1 tablet (5 mg total) by mouth daily. 180 tablet 3  . aspirin 325 MG tablet Take 81 mg by mouth daily.    . cholecalciferol (VITAMIN D3) 25 MCG (1000 UNIT) tablet Take 1,000 Units by mouth daily.    . Garlic Oil 353 MG TABS Take 250 mg by mouth daily.    Marland Kitchen lisinopril (ZESTRIL) 40 MG tablet Take 20 mg by mouth daily.    . nitroGLYCERIN (NITROSTAT) 0.4 MG SL tablet Place 1 tablet (0.4 mg total) under the tongue every 5 (five) minutes as needed. 25 tablet 6  . omega-3 acid ethyl esters (LOVAZA) 1 g capsule Take 2 capsules (2 g total) by mouth 2 (two) times daily. 120 capsule 12  . SYMBICORT 160-4.5 MCG/ACT inhaler Inhale 2 puffs into the lungs 2 (two) times daily.     No current  facility-administered medications for this visit.    Musculoskeletal: Strength & Muscle Tone: within normal limits Gait & Station: normal Patient leans: N/A  Psychiatric Specialty Exam: ROS BH MD/PA/NP OP Progress Note  10/29/2020 8:49 AM [name]  MRN:  [id number]  Chief Complaint: Irritability  HPI:   Visit Diagnosis: Mild neurocognitive disorder  At this time the  patient's first problem is that of mild neurocognitive disorder probably related to head trauma.  At this time we will give him a small dose of Xanax 0.5 mg to take at night.  We will also restart his Tegretol 100 mg twice daily and asked him to get in 1 month to go to his primary care doctor to get a Tegretol blood level in the morning get a comprehensive metabolic panel.  Patient be seen again in this office in 3 months.  The patient is not homicidal and not suicidal and still functions fairly well.  He stays busy and active.  Past Psychiatric History: See intake H&P for full details. Reviewed, with no updates at this time.   Past Medical History:  Past Medical History:  Diagnosis Date  . Anxiety   . Bipolar disorder ([place])   . Bipolar disorder ([place])   . Cardiac abnormality   . Chest discomfort 06/18/2018  . Cigarette smoker 06/18/2018  . COPD (chronic obstructive pulmonary disease) ([place])   . Essential hypertension 06/18/2018  . Generalized anxiety disorder 09/15/2017  . Headache   . Heart defect, congenital   . History of COVID-19   . History of repair of congenital atrial septal defect (ASD) 06/18/2018  . History of TIA (transient ischemic attack)   . History of tobacco use   . Hypertension   . Hypertension, essential, benign   . Low back pain 07/14/2013  . Migraine headache   . Mixed dyslipidemia 08/05/2020  . Neck pain   . Palpitations   . Stroke ([place])   . Tobacco abuse   . Ventricular septal defect     Past Surgical History:  Procedure Laterality Date  . AMPUTATION FINGER / THUMB    . CARDIAC  SURGERY  at 45 years old    Family Psychiatric History: See intake H&P for full details. Reviewed, with no updates at this time.   Family History:  Family History  Problem Relation Age of Onset  . Schizophrenia Father   . Bipolar disorder Maternal Uncle   . Bipolar disorder Maternal Grandmother     Social History:  Social History   Socioeconomic History  . Marital status: Divorced    Spouse name: Not on file  . Number of children: 2  . Years of education: Not on file  . Highest education level: 11th grade  Occupational History  . Not on file  Tobacco Use  . Smoking status: Current Every Day Smoker    Packs/day: 3.00    Types: Cigarettes  . Smokeless tobacco: Never Used  Vaping Use  . Vaping Use: Never used  Substance and Sexual Activity  . Alcohol use: No  . Drug use: No  . Sexual activity: Not on file  Other Topics Concern  . Not on file  Social History Narrative  . Not on file   Social Determinants of Health   Financial Resource Strain: Not on file  Food Insecurity: Not on file  Transportation Needs: Not on file  Physical Activity: Not on file  Stress: Not on file  Social Connections: Not on file    Allergies: No Known Allergies  Metabolic Disorder Labs: No results found for: HGBA1C, MPG No results found for: PROLACTIN Lab Results  Component Value Date   CHOL 177 07/16/2020   TRIG 248 (H) 07/16/2020   HDL 23 (L) 07/16/2020   CHOLHDL 7.7 (H) 07/16/2020   LDLCALC 111 (H) 07/16/2020   No results found for: TSH  Therapeutic Level Labs: No  results found for: LITHIUM No results found for: VALPROATE No components found for:  CBMZ  Current Medications: Current Outpatient Medications  Medication Sig Dispense Refill  . ALPRAZolam (XANAX) 0.5 MG tablet 1  qhs 30 tablet 3  . carbamazepine (TEGRETOL-XR) 100 MG 12 hr tablet 1  bid 60 tablet 4  . albuterol (PROVENTIL HFA;VENTOLIN HFA) 108 (90 Base) MCG/ACT inhaler Inhale 1-2 puffs into the lungs daily as  needed for wheezing or shortness of breath.    Marland Kitchen amLODipine (NORVASC) 5 MG tablet Take 1 tablet (5 mg total) by mouth daily. 180 tablet 3  . aspirin 325 MG tablet Take 81 mg by mouth daily.    . cholecalciferol (VITAMIN D3) 25 MCG (1000 UNIT) tablet Take 1,000 Units by mouth daily.    . Garlic Oil 619 MG TABS Take 250 mg by mouth daily.    Marland Kitchen lisinopril (ZESTRIL) 40 MG tablet Take 20 mg by mouth daily.    . nitroGLYCERIN (NITROSTAT) 0.4 MG SL tablet Place 1 tablet (0.4 mg total) under the tongue every 5 (five) minutes as needed. 25 tablet 6  . omega-3 acid ethyl esters (LOVAZA) 1 g capsule Take 2 capsules (2 g total) by mouth 2 (two) times daily. 120 capsule 12  . SYMBICORT 160-4.5 MCG/ACT inhaler Inhale 2 puffs into the lungs 2 (two) times daily.     No current facility-administered medications for this visit.    Musculoskeletal: Strength & Muscle Tone: within normal limits Gait & Station: normal Patient leans: N/A  Psychiatric Specialty Exam: ROS  There were no vitals taken for this visit.There is no height or weight on file to calculate BMI.  General Appearance: Casual and Disheveled  Eye Contact:  Fair  Speech:  Clear and Coherent and Normal Rate  Volume:  Normal  Mood:  Less irritable  Affect:  Appropriate and Congruent  Thought Process:  Goal Directed and Descriptions of Associations: Intact  Orientation:  Full (Time, Place, and Person)  Thought Content: Logical and Focused on Xanax   Suicidal Thoughts:  No  Homicidal Thoughts:  No  Memory:  Immediate;   Poor  Judgement:  Fair  Insight:  Present, Shallow and Improving  Psychomotor Activity:  Normal  Concentration:  Concentration: Fair  Recall:  [employer] of Knowledge: Fair  Language: Fair  Akathisia:  Negative  Handed:  Right  AIMS (if indicated): not done  Assets:  Communication Skills Desire for Improvement  ADL's:  Intact  Cognition: WNL  Sleep:  Fair   Screenings: PHQ2-9   [place] Patient Outreach  Telephone from 03/12/2017 in [place]  PHQ-2 Total Score 2  PHQ-9 Total Score 4       Assessment and Plan   This patient's first problem is that of vascular dementia.  This is unfortunate and that he is very young.  The patient does seems to be functioning fairly well.  He continues to work out and work on his cars and motorcycles.  His first problem therefore is vascular dementia with irritability.  At this time we will continue taking Tegretol 200 mg twice daily.  When his next visit we will clarify his blood work.  His second problem is that of insomnia.  He takes doxepin and does very well.  He will return to see me hopefully in 4 months.  He is functioning reasonably well.  He is not suicidal and he is not homicidal he is not violent. Labs Ordered: No orders of the defined types  were placed in this encounter.   Labs Reviewed: na  Collateral Obtained/Records Reviewed: Wife is present and able to corroborate episodic agitation and explosive behaviors when he is angry  Plan:    Equetro 100 mg twice daily.  The patient return to see [name] in 2-1/2 months and shared how often he is been explosive.  At that time we will get blood work including a comprehensive metabolic panel.  Jerral Ralph, MD 10/29/2020, 8:49 AM  There were no vitals taken for this visit.There is no height or weight on file to calculate BMI.  General Appearance: Casual and Disheveled  Eye Contact:  Fair  Speech:  Clear and Coherent and Normal Rate  Volume:  Normal  Mood:  Less irritable  Affect:  Appropriate and Congruent  Thought Process:  Goal Directed and Descriptions of Associations: Intact  Orientation:  Full (Time, Place, and Person)  Thought Content: Logical and Focused on Xanax   Suicidal Thoughts:  No  Homicidal Thoughts:  No  Memory:  Immediate;   Poor  Judgement:  Fair  Insight:  Present, Shallow and Improving  Psychomotor Activity:  Normal  Concentration:  Concentration: Fair   Recall:  [employer] of Knowledge: Fair  Language: Fair  Akathisia:  Negative  Handed:  Right  AIMS (if indicated): not done  Assets:  Communication Skills Desire for Improvement  ADL's:  Intact  Cognition: WNL  Sleep:  Fair   Screenings: PHQ2-9   [place] Patient Outreach Telephone from 03/12/2017 in [place]  PHQ-2 Total Score 2  PHQ-9 Total Score 4       Assessment and Plan   This patient is diagnosed with vascular dementia.  He has significant irritability.  Therefore he had a behavioral disturbance with dementia.  At this time he will continue taking Tegretol 100 mg twice daily we will attempt to get his laboratory liver enzymes from Dr. Estil Daft in [facility].  He also has a problem with insomnia.  Today we will discontinue his Seroquel which has a bad effect the next day.  Begin doxepin 25 mg.  This patient will be seen again in 3 months with his wife. Status of current problems: new to [employer] Ordered: No orders of the defined types were placed in this encounter.   Labs Reviewed: na  Collateral Obtained/Records Reviewed: Wife is present and able to corroborate episodic agitation and explosive behaviors when he is angry  Plan:    Equetro 100 mg twice daily.  The patient return to see [name] in 2-1/2 months and shared how often he is been explosive.  At that time we will get blood work including a comprehensive metabolic panel.  Jerral Ralph, MD 10/29/2020, 8:49 AM

## 2020-11-09 DIAGNOSIS — Z6832 Body mass index (BMI) 32.0-32.9, adult: Secondary | ICD-10-CM | POA: Diagnosis not present

## 2020-11-09 DIAGNOSIS — R072 Precordial pain: Secondary | ICD-10-CM | POA: Diagnosis not present

## 2020-11-09 DIAGNOSIS — J019 Acute sinusitis, unspecified: Secondary | ICD-10-CM | POA: Diagnosis not present

## 2020-11-09 DIAGNOSIS — M549 Dorsalgia, unspecified: Secondary | ICD-10-CM | POA: Diagnosis not present

## 2020-12-10 ENCOUNTER — Other Ambulatory Visit: Payer: Self-pay

## 2020-12-13 ENCOUNTER — Other Ambulatory Visit: Payer: Self-pay

## 2020-12-13 ENCOUNTER — Encounter: Payer: Self-pay | Admitting: Cardiology

## 2020-12-13 ENCOUNTER — Ambulatory Visit: Payer: Medicare HMO | Admitting: Cardiology

## 2020-12-13 VITALS — BP 128/78 | HR 96 | Ht 72.0 in | Wt 233.2 lb

## 2020-12-13 DIAGNOSIS — R931 Abnormal findings on diagnostic imaging of heart and coronary circulation: Secondary | ICD-10-CM

## 2020-12-13 DIAGNOSIS — E782 Mixed hyperlipidemia: Secondary | ICD-10-CM | POA: Diagnosis not present

## 2020-12-13 DIAGNOSIS — F17200 Nicotine dependence, unspecified, uncomplicated: Secondary | ICD-10-CM

## 2020-12-13 DIAGNOSIS — Z720 Tobacco use: Secondary | ICD-10-CM

## 2020-12-13 DIAGNOSIS — I10 Essential (primary) hypertension: Secondary | ICD-10-CM | POA: Diagnosis not present

## 2020-12-13 DIAGNOSIS — F17210 Nicotine dependence, cigarettes, uncomplicated: Secondary | ICD-10-CM | POA: Diagnosis not present

## 2020-12-13 HISTORY — DX: Abnormal findings on diagnostic imaging of heart and coronary circulation: R93.1

## 2020-12-13 MED ORDER — HM COQ-10 200 MG PO CAPS: 200.0000 mg | ORAL_CAPSULE | Freq: Every day | ORAL | 3 refills | Status: AC

## 2020-12-13 MED ORDER — ROSUVASTATIN CALCIUM 10 MG PO TABS: 10.0000 mg | ORAL_TABLET | Freq: Every day | ORAL | 3 refills | Status: DC

## 2020-12-13 NOTE — Patient Instructions (Signed)
Medication Instructions:  Your physician has recommended you make the following change in your medication:   Start CoQ 10 200 mg daily. Start Rosuvastatin 10 mg daily.  *If you need a refill on your cardiac medications before your next appointment, please call your pharmacy*   Lab Work: Your physician recommends that you have labs done in the office today. Your test included  basic metabolic panel, and liver function.  Your physician recommends that you return for lab work in: 6 weeks (01/24/21). You need to have labs done when you are fasting.  You can come Monday through Friday 8:30 am to 12:00 pm and 1:15 to 4:30. You do not need to make an appointment as the order has already been placed. The labs you are going to have done are BMET, LFT and Lipids.   If you have labs (blood work) drawn today and your tests are completely normal, you will receive your results only by: [place] (if you have MyChart) OR A paper copy in the mail If you have any lab test that is abnormal or we need to change your treatment, we will call you to review the results.   Testing/Procedures: None ordered   Follow-Up: At [facility], you and your health needs are our priority.  As part of our continuing mission to provide you with exceptional heart care, we have created designated Provider Care Teams.  These Care Teams include your primary Cardiologist (physician) and Advanced Practice Providers (APPs -  Physician Assistants and Nurse Practitioners) who all work together to provide you with the care you need, when you need it.  We recommend signing up for the patient portal called "MyChart".  Sign up information is provided on this After Visit Summary.  MyChart is used to connect with patients for Virtual Visits (Telemedicine).  Patients are able to view lab/test results, encounter notes, upcoming appointments, etc.  Non-urgent messages can be sent to your provider as well.   To learn more about what  you can do with MyChart, go to [email or web address].    Your next appointment:   3 month(s)  The format for your next appointment:   In Person  Provider:   Jyl Heinz, MD   Other Instructions NA

## 2020-12-13 NOTE — Progress Notes (Signed)
Cardiology Office Note:    Date:  12/13/2020   ID:  Roy Koch, DOB [birth year], MRN [id number]  PCP:  [name], PA-C  Cardiologist:  Jenean Lindau, MD   Referring MD: [name], PA-C    ASSESSMENT:    1. Mixed dyslipidemia   2. Tobacco abuse   3. Essential hypertension   4. Elevated coronary artery calcium score    PLAN:    In order of problems listed above:  Elevated calcium score: Secondary prevention stressed with the patient.  Importance of compliance with diet medication stressed any vocalized understanding.  He was advised to walk at least half an hour a day 5 days a week and he promises to do so. Mixed dyslipidemia: Unclear why he is not on statin therapy.  He was advised to resume it.  He will have LFTs and Chem-7 done today and will initiate him on rosuvastatin 10 mg daily.  Benefits and potential risk of the medication explained any vocalized understanding.  He will supplement with co-Q10.Patient will be seen in follow-up appointment in 6 months or earlier if the patient has any concerns.  He will be back in 6 weeks for liver lipid check. Cigarette smoker: I spent 5 minutes with the patient discussing solely about smoking. Smoking cessation was counseled. I suggested to the patient also different medications and pharmacological interventions. Patient is keen to try stopping on its own at this time. He will get back to me if he needs any further assistance in this matter.    Medication Adjustments/Labs and Tests Ordered: Current medicines are reviewed at length with the patient today.  Concerns regarding medicines are outlined above.  Orders Placed This Encounter  Procedures   Basic metabolic panel   Hepatic function panel   Basic metabolic panel   Hepatic function panel   Lipid panel    Meds ordered this encounter  Medications   rosuvastatin (CRESTOR) 10 MG tablet    Sig: Take 1 tablet (10 mg total) by mouth daily.    Dispense:  90 tablet     Refill:  3   Coenzyme Q10 (HM COQ-10) 200 MG capsule    Sig: Take 1 capsule (200 mg total) by mouth daily.    Dispense:  90 capsule    Refill:  3      No chief complaint on file.    History of Present Illness:    Roy Koch is a 45 y.o. male.  Patient has past medical history of elevated calcium score, cigarette smoking and history of repaired septal defect.  Patient denies any problems at this time and takes care of activities of daily living.  No chest pain orthopnea or PND.  He mentions to me that he has cut down smoking from 3 packs to less than a pack a day.  At the time of my evaluation, the patient is alert awake oriented and in no distress.  Past Medical History:  Diagnosis Date   Anxiety    Bipolar disorder ([place])    Bipolar disorder ([place])    Cardiac abnormality    Chest discomfort 06/18/2018   Chest tightness 10/11/2020   Cigarette smoker 06/18/2018   COPD (chronic obstructive pulmonary disease) ([place])    Essential hypertension 06/18/2018   Generalized anxiety disorder 09/15/2017   Headache    Heart defect, congenital    History of COVID-19    History of repair of congenital atrial septal defect (ASD) 06/18/2018   History of TIA (transient  ischemic attack)    History of tobacco use    Hypertension    Hypertension, essential, benign    Low back pain 07/14/2013   Migraine headache    Mixed dyslipidemia 08/05/2020   Neck pain    Palpitations    Stroke ([place])    Tobacco abuse    Ventricular septal defect     Past Surgical History:  Procedure Laterality Date   AMPUTATION FINGER / [place]  at 45 years old    Current Medications: Current Meds  Medication Sig   albuterol (PROVENTIL HFA;VENTOLIN HFA) 108 (90 Base) MCG/ACT inhaler Inhale 1-2 puffs into the lungs daily as needed for wheezing or shortness of breath.   ALPRAZolam (XANAX XR) 0.5 MG 24 hr tablet Take 0.5 mg by mouth at bedtime.   amLODipine (NORVASC) 5 MG tablet Take 1 tablet (5 mg total)  by mouth daily.   aspirin EC 81 MG tablet Take 81 mg by mouth daily. Swallow whole.   carbamazepine (TEGRETOL XR) 100 MG 12 hr tablet Take 100 mg by mouth 2 (two) times daily.   cholecalciferol (VITAMIN D3) 25 MCG (1000 UNIT) tablet Take 1,000 Units by mouth daily.   Coenzyme Q10 (HM COQ-10) 200 MG capsule Take 1 capsule (200 mg total) by mouth daily.   Garlic Oil 889 MG TABS Take 250 mg by mouth daily.   lisinopril (ZESTRIL) 40 MG tablet Take 20 mg by mouth daily.   nitroGLYCERIN (NITROSTAT) 0.4 MG SL tablet Place 0.4 mg under the tongue every 5 (five) minutes as needed for chest pain.   omega-3 acid ethyl esters (LOVAZA) 1 g capsule Take 2 capsules (2 g total) by mouth 2 (two) times daily.   rosuvastatin (CRESTOR) 10 MG tablet Take 1 tablet (10 mg total) by mouth daily.   SYMBICORT 160-4.5 MCG/ACT inhaler Inhale 2 puffs into the lungs 2 (two) times daily.     Allergies:   Patient has no known allergies.   Social History   Socioeconomic History   Marital status: Divorced    Spouse name: Not on file   Number of children: 2   Years of education: Not on file   Highest education level: 11th grade  Occupational History   Not on file  Tobacco Use   Smoking status: Every Day    Packs/day: 1.00    Types: Cigarettes   Smokeless tobacco: Never  Vaping Use   Vaping Use: Never used  Substance and Sexual Activity   Alcohol use: No   Drug use: No   Sexual activity: Not on file  Other Topics Concern   Not on file  Social History Narrative   Not on file   Social Determinants of Health   Financial Resource Strain: Not on file  Food Insecurity: Not on file  Transportation Needs: Not on file  Physical Activity: Not on file  Stress: Not on file  Social Connections: Not on file     Family History: The patient's family history includes Bipolar disorder in his maternal grandmother and maternal uncle; Schizophrenia in his father.  ROS:   Please see the history of present illness.     All other systems reviewed and are negative.  EKGs/Labs/Other Studies Reviewed:    The following studies were reviewed today: I discussed my findings with the patient at length.   Recent Labs: 07/16/2020: ALT 108; BUN 8; Creatinine, Ser 0.91; Potassium 3.9; Sodium 138  Recent Lipid Panel    Component Value  Date/Time   CHOL 177 07/16/2020 1556   TRIG 248 (H) 07/16/2020 1556   HDL 23 (L) 07/16/2020 1556   CHOLHDL 7.7 (H) 07/16/2020 1556   LDLCALC 111 (H) 07/16/2020 1556    Physical Exam:    VS:  BP 128/78   Pulse 96   Ht 6' (1.829 m)   Wt 233 lb 3.2 oz (105.8 kg)   SpO2 96%   BMI 31.63 kg/m     Wt Readings from Last 3 Encounters:  12/13/20 233 lb 3.2 oz (105.8 kg)  10/26/20 230 lb (104.3 kg)  10/11/20 230 lb 3.2 oz (104.4 kg)     GEN: Patient is in no acute distress HEENT: Normal NECK: No JVD; No carotid bruits LYMPHATICS: No lymphadenopathy CARDIAC: Hear sounds regular, 2/6 systolic murmur at the apex. RESPIRATORY:  Clear to auscultation without rales, wheezing or rhonchi  ABDOMEN: Soft, non-tender, non-distended MUSCULOSKELETAL:  No edema; No deformity  SKIN: Warm and dry NEUROLOGIC:  Alert and oriented x 3 PSYCHIATRIC:  Normal affect   Signed, Jenean Lindau, MD  12/13/2020 1:16 PM    [facility]

## 2020-12-14 LAB — HEPATIC FUNCTION PANEL
ALT: 44 IU/L (ref 0–44)
AST: 27 IU/L (ref 0–40)
Albumin: 4.6 g/dL (ref 4.0–5.0)
Alkaline Phosphatase: 72 IU/L (ref 44–121)
Bilirubin Total: 0.4 mg/dL (ref 0.0–1.2)
Bilirubin, Direct: 0.11 mg/dL (ref 0.00–0.40)
Total Protein: 7 g/dL (ref 6.0–8.5)

## 2020-12-14 LAB — BASIC METABOLIC PANEL
BUN/Creatinine Ratio: 12 (ref 9–20)
BUN: 10 mg/dL (ref 6–24)
CO2: 20 mmol/L (ref 20–29)
Calcium: 9.1 mg/dL (ref 8.7–10.2)
Chloride: 104 mmol/L (ref 96–106)
Creatinine, Ser: 0.84 mg/dL (ref 0.76–1.27)
Glucose: 113 mg/dL — ABNORMAL HIGH (ref 65–99)
Potassium: 4.3 mmol/L (ref 3.5–5.2)
Sodium: 138 mmol/L (ref 134–144)
eGFR: 110 mL/min/{1.73_m2} (ref >59)

## 2021-01-10 DIAGNOSIS — J019 Acute sinusitis, unspecified: Secondary | ICD-10-CM | POA: Diagnosis not present

## 2021-01-10 DIAGNOSIS — K029 Dental caries, unspecified: Secondary | ICD-10-CM | POA: Diagnosis not present

## 2021-01-10 DIAGNOSIS — Z6832 Body mass index (BMI) 32.0-32.9, adult: Secondary | ICD-10-CM | POA: Diagnosis not present

## 2021-01-12 DIAGNOSIS — F17210 Nicotine dependence, cigarettes, uncomplicated: Secondary | ICD-10-CM | POA: Diagnosis not present

## 2021-01-12 DIAGNOSIS — J449 Chronic obstructive pulmonary disease, unspecified: Secondary | ICD-10-CM | POA: Diagnosis not present

## 2021-01-12 DIAGNOSIS — Z20822 Contact with and (suspected) exposure to covid-19: Secondary | ICD-10-CM | POA: Diagnosis not present

## 2021-01-12 DIAGNOSIS — R001 Bradycardia, unspecified: Secondary | ICD-10-CM | POA: Diagnosis not present

## 2021-01-12 DIAGNOSIS — R079 Chest pain, unspecified: Secondary | ICD-10-CM | POA: Diagnosis not present

## 2021-01-12 DIAGNOSIS — R42 Dizziness and giddiness: Secondary | ICD-10-CM | POA: Diagnosis not present

## 2021-01-12 DIAGNOSIS — I10 Essential (primary) hypertension: Secondary | ICD-10-CM | POA: Diagnosis not present

## 2021-01-12 DIAGNOSIS — R519 Headache, unspecified: Secondary | ICD-10-CM | POA: Diagnosis not present

## 2021-01-12 DIAGNOSIS — Z7982 Long term (current) use of aspirin: Secondary | ICD-10-CM | POA: Diagnosis not present

## 2021-02-02 ENCOUNTER — Ambulatory Visit (INDEPENDENT_AMBULATORY_CARE_PROVIDER_SITE_OTHER): Payer: Medicare HMO | Admitting: Psychiatry

## 2021-02-02 ENCOUNTER — Other Ambulatory Visit: Payer: Self-pay

## 2021-02-02 DIAGNOSIS — F0151 Vascular dementia with behavioral disturbance: Secondary | ICD-10-CM | POA: Diagnosis not present

## 2021-02-02 DIAGNOSIS — F01518 Vascular dementia, unspecified severity, with other behavioral disturbance: Secondary | ICD-10-CM

## 2021-02-02 DIAGNOSIS — R454 Irritability and anger: Secondary | ICD-10-CM

## 2021-02-02 MED ORDER — CARBAMAZEPINE ER 100 MG PO TB12: 100.0000 mg | ORAL_TABLET | Freq: Two times a day (BID) | ORAL | 6 refills | Status: DC

## 2021-02-02 MED ORDER — ALPRAZOLAM ER 0.5 MG PO TB24: 0.5000 mg | ORAL_TABLET | Freq: Every day | ORAL | 5 refills | Status: DC
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
## 2021-02-22 DIAGNOSIS — R519 Headache, unspecified: Secondary | ICD-10-CM | POA: Diagnosis not present

## 2021-02-22 DIAGNOSIS — Z8673 Personal history of transient ischemic attack (TIA), and cerebral infarction without residual deficits: Secondary | ICD-10-CM | POA: Diagnosis not present

## 2021-02-22 DIAGNOSIS — S069X9S Unspecified intracranial injury with loss of consciousness of unspecified duration, sequela: Secondary | ICD-10-CM | POA: Diagnosis not present

## 2021-03-04 ENCOUNTER — Other Ambulatory Visit: Payer: Self-pay

## 2021-03-07 DIAGNOSIS — R519 Headache, unspecified: Secondary | ICD-10-CM | POA: Diagnosis not present

## 2021-03-07 DIAGNOSIS — G9389 Other specified disorders of brain: Secondary | ICD-10-CM | POA: Diagnosis not present

## 2021-03-07 DIAGNOSIS — R42 Dizziness and giddiness: Secondary | ICD-10-CM | POA: Diagnosis not present

## 2021-03-07 DIAGNOSIS — S069XAS Unspecified intracranial injury with loss of consciousness status unknown, sequela: Secondary | ICD-10-CM | POA: Diagnosis not present

## 2021-03-08 ENCOUNTER — Encounter: Payer: Self-pay | Admitting: Cardiology

## 2021-03-08 ENCOUNTER — Ambulatory Visit: Payer: Medicare HMO | Admitting: Cardiology

## 2021-03-08 ENCOUNTER — Other Ambulatory Visit: Payer: Self-pay

## 2021-03-08 VITALS — BP 134/80 | HR 66 | Ht 72.0 in | Wt 236.2 lb

## 2021-03-08 DIAGNOSIS — R931 Abnormal findings on diagnostic imaging of heart and coronary circulation: Secondary | ICD-10-CM | POA: Diagnosis not present

## 2021-03-08 DIAGNOSIS — F17210 Nicotine dependence, cigarettes, uncomplicated: Secondary | ICD-10-CM

## 2021-03-08 DIAGNOSIS — I10 Essential (primary) hypertension: Secondary | ICD-10-CM

## 2021-03-08 DIAGNOSIS — Z87891 Personal history of nicotine dependence: Secondary | ICD-10-CM | POA: Diagnosis not present

## 2021-03-08 DIAGNOSIS — E782 Mixed hyperlipidemia: Secondary | ICD-10-CM | POA: Diagnosis not present

## 2021-03-08 NOTE — Progress Notes (Signed)
Cardiology Office Note:    Date:  03/08/2021   ID:  Roy Koch, DOB [birth year], MRN [id number]  PCP:  [name], PA-C  Cardiologist:  Jenean Lindau, MD   Referring MD: [name], PA-C    ASSESSMENT:    1. Elevated coronary artery calcium score   2. Essential hypertension   3. History of tobacco use   4. Mixed dyslipidemia    PLAN:    In order of problems listed above:  Primary prevention stressed with the patient.  Importance of compliance with diet medication stressed and he vocalized understanding.  He has issues with elevated calcium score and he was cautioned about secondary prevention at length.  He understands. His blood pressure stable and lifestyle modification was urged.  He was advised to walk at least half an hour a day 5 days a week. Mixed dyslipidemia: Diet emphasized.  He promises to do better and he will have blood work today including fasting lipids Cigarette smoker: I spent 5 minutes with the patient discussing solely about smoking. Smoking cessation was counseled. I suggested to the patient also different medications and pharmacological interventions. Patient is keen to try stopping on its own at this time. He will get back to me if he needs any further assistance in this matter. Patient will be seen in follow-up appointment in 6 months or earlier if the patient has any concerns    Medication Adjustments/Labs and Tests Ordered: Current medicines are reviewed at length with the patient today.  Concerns regarding medicines are outlined above.  No orders of the defined types were placed in this encounter.  No orders of the defined types were placed in this encounter.    No chief complaint on file.    History of Present Illness:    Roy Koch is a 45 y.o. male.  Patient has past medical history of elevated calcium score, essential hypertension dyslipidemia and continues to unfortunately smoke.  He has had repair of congenital atrial septal  defect in his young age.  He denies any chest pain orthopnea or PND.  He is an active gentleman but does not exercise on a regular basis.  At the time of my evaluation, the patient is alert awake oriented and in no distress.  Past Medical History:  Diagnosis Date   Anxiety    Bipolar disorder ([place])    Bipolar disorder ([place])    Cardiac abnormality    Chest discomfort 06/18/2018   Chest tightness 10/11/2020   Cigarette smoker 06/18/2018   COPD (chronic obstructive pulmonary disease) ([facility])    Elevated coronary artery calcium score 12/13/2020   Essential hypertension 06/18/2018   Generalized anxiety disorder 09/15/2017   Headache    Heart defect, congenital    History of COVID-19    History of repair of congenital atrial septal defect (ASD) 06/18/2018   History of TIA (transient ischemic attack)    History of tobacco use    Hypertension    Hypertension, essential, benign    Low back pain 07/14/2013   Migraine headache    Mixed dyslipidemia 08/05/2020   Neck pain    Palpitations    Stroke ([place])    Tobacco abuse    Ventricular septal defect     Past Surgical History:  Procedure Laterality Date   AMPUTATION FINGER / [place]  at 45 years old    Current Medications: Current Meds  Medication Sig   albuterol (PROVENTIL HFA;VENTOLIN HFA) 108 (90  Base) MCG/ACT inhaler Inhale 1-2 puffs into the lungs daily as needed for wheezing or shortness of breath.   ALPRAZolam (XANAX) 0.5 MG tablet Take 0.5 mg by mouth at bedtime.   amLODipine (NORVASC) 5 MG tablet Take 5 mg by mouth daily.   aspirin EC 81 MG tablet Take 81 mg by mouth daily. Swallow whole.   carbamazepine (TEGRETOL XR) 100 MG 12 hr tablet Take 1 tablet (100 mg total) by mouth 2 (two) times daily.   cholecalciferol (VITAMIN D3) 25 MCG (1000 UNIT) tablet Take 1,000 Units by mouth daily.   Coenzyme Q10 (HM COQ-10) 200 MG capsule Take 1 capsule (200 mg total) by mouth daily.   Garlic Oil 967 MG TABS Take 250 mg by mouth  daily.   lisinopril (ZESTRIL) 40 MG tablet Take 20 mg by mouth daily.   nitroGLYCERIN (NITROSTAT) 0.4 MG SL tablet Place 0.4 mg under the tongue every 5 (five) minutes as needed for chest pain.   omega-3 acid ethyl esters (LOVAZA) 1 g capsule Take 2 capsules (2 g total) by mouth 2 (two) times daily.   rosuvastatin (CRESTOR) 10 MG tablet Take 1 tablet (10 mg total) by mouth daily.   SYMBICORT 160-4.5 MCG/ACT inhaler Inhale 2 puffs into the lungs 2 (two) times daily.     Allergies:   Patient has no known allergies.   Social History   Socioeconomic History   Marital status: Divorced    Spouse name: Not on file   Number of children: 2   Years of education: Not on file   Highest education level: 11th grade  Occupational History   Not on file  Tobacco Use   Smoking status: Every Day    Packs/day: 1.00    Types: Cigarettes   Smokeless tobacco: Never  Vaping Use   Vaping Use: Never used  Substance and Sexual Activity   Alcohol use: No   Drug use: No   Sexual activity: Not on file  Other Topics Concern   Not on file  Social History Narrative   Not on file   Social Determinants of Health   Financial Resource Strain: Not on file  Food Insecurity: Not on file  Transportation Needs: Not on file  Physical Activity: Not on file  Stress: Not on file  Social Connections: Not on file     Family History: The patient's family history includes Bipolar disorder in his maternal grandmother and maternal uncle; Schizophrenia in his father.  ROS:   Please see the history of present illness.    All other systems reviewed and are negative.  EKGs/Labs/Other Studies Reviewed:    The following studies were reviewed today: I discussed my findings with the patient at length.   Recent Labs: 12/13/2020: ALT 44; BUN 10; Creatinine, Ser 0.84; Potassium 4.3; Sodium 138  Recent Lipid Panel    Component Value Date/Time   CHOL 177 07/16/2020 1556   TRIG 248 (H) 07/16/2020 1556   HDL 23 (L)  07/16/2020 1556   CHOLHDL 7.7 (H) 07/16/2020 1556   LDLCALC 111 (H) 07/16/2020 1556    Physical Exam:    VS:  BP 134/80   Pulse 66   Ht 6' (1.829 m)   Wt 236 lb 3.2 oz (107.1 kg)   SpO2 99%   BMI 32.03 kg/m     Wt Readings from Last 3 Encounters:  03/08/21 236 lb 3.2 oz (107.1 kg)  12/13/20 233 lb 3.2 oz (105.8 kg)  10/26/20 230 lb (104.3 kg)  GEN: Patient is in no acute distress HEENT: Normal NECK: No JVD; No carotid bruits LYMPHATICS: No lymphadenopathy CARDIAC: Hear sounds regular, 2/6 systolic murmur at the apex. RESPIRATORY:  Clear to auscultation without rales, wheezing or rhonchi  ABDOMEN: Soft, non-tender, non-distended MUSCULOSKELETAL:  No edema; No deformity  SKIN: Warm and dry NEUROLOGIC:  Alert and oriented x 3 PSYCHIATRIC:  Normal affect   Signed, Jenean Lindau, MD  03/08/2021 2:11 PM    [facility]

## 2021-03-08 NOTE — Patient Instructions (Signed)
Medication Instructions:  Your physician recommends that you continue on your current medications as directed. Please refer to the Current Medication list given to you today.  *If you need a refill on your cardiac medications before your next appointment, please call your pharmacy*   Lab Work: Your physician recommends that you have labs done in the office today. Your test included  basic metabolic panel,  liver function and lipids.  If you have labs (blood work) drawn today and your tests are completely normal, you will receive your results only by: [place] (if you have MyChart) OR A paper copy in the mail If you have any lab test that is abnormal or we need to change your treatment, we will call you to review the results.   Testing/Procedures: None ordered   Follow-Up: At [facility], you and your health needs are our priority.  As part of our continuing mission to provide you with exceptional heart care, we have created designated Provider Care Teams.  These Care Teams include your primary Cardiologist (physician) and Advanced Practice Providers (APPs -  Physician Assistants and Nurse Practitioners) who all work together to provide you with the care you need, when you need it.  We recommend signing up for the patient portal called "MyChart".  Sign up information is provided on this After Visit Summary.  MyChart is used to connect with patients for Virtual Visits (Telemedicine).  Patients are able to view lab/test results, encounter notes, upcoming appointments, etc.  Non-urgent messages can be sent to your provider as well.   To learn more about what you can do with MyChart, go to [email or web address].    Your next appointment:   6 month(s)  The format for your next appointment:   In Person  Provider:   Jyl Heinz, MD   Other Instructions NA

## 2021-03-09 LAB — LIPID PANEL
Chol/HDL Ratio: 5.3 ratio — ABNORMAL HIGH (ref 0.0–5.0)
Cholesterol, Total: 175 mg/dL (ref 100–199)
HDL: 33 mg/dL — ABNORMAL LOW (ref >39)
LDL Chol Calc (NIH): 104 mg/dL — ABNORMAL HIGH (ref 0–99)
Triglycerides: 222 mg/dL — ABNORMAL HIGH (ref 0–149)
VLDL Cholesterol Cal: 38 mg/dL (ref 5–40)

## 2021-03-09 LAB — BASIC METABOLIC PANEL
BUN/Creatinine Ratio: 8 — ABNORMAL LOW (ref 9–20)
BUN: 7 mg/dL (ref 6–24)
CO2: 23 mmol/L (ref 20–29)
Calcium: 9.7 mg/dL (ref 8.7–10.2)
Chloride: 102 mmol/L (ref 96–106)
Creatinine, Ser: 0.84 mg/dL (ref 0.76–1.27)
Glucose: 88 mg/dL (ref 70–99)
Potassium: 4.3 mmol/L (ref 3.5–5.2)
Sodium: 136 mmol/L (ref 134–144)
eGFR: 109 mL/min/{1.73_m2} (ref >59)

## 2021-03-09 LAB — HEPATIC FUNCTION PANEL
ALT: 40 IU/L (ref 0–44)
AST: 23 IU/L (ref 0–40)
Albumin: 4.8 g/dL (ref 4.0–5.0)
Alkaline Phosphatase: 79 IU/L (ref 44–121)
Bilirubin Total: 0.2 mg/dL (ref 0.0–1.2)
Bilirubin, Direct: <0.1 mg/dL (ref 0.00–0.40)
Total Protein: 7.7 g/dL (ref 6.0–8.5)

## 2021-03-11 MED ORDER — ROSUVASTATIN CALCIUM 20 MG PO TABS: 20.0000 mg | ORAL_TABLET | Freq: Every day | ORAL | 3 refills | Status: DC

## 2021-03-11 NOTE — Addendum Note (Signed)
Addended by: Truddie Hidden on: 03/11/2021 09:34 AM   Modules accepted: Orders

## 2021-03-15 DIAGNOSIS — R197 Diarrhea, unspecified: Secondary | ICD-10-CM | POA: Diagnosis not present

## 2021-03-15 DIAGNOSIS — I10 Essential (primary) hypertension: Secondary | ICD-10-CM | POA: Diagnosis not present

## 2021-03-15 DIAGNOSIS — Z8673 Personal history of transient ischemic attack (TIA), and cerebral infarction without residual deficits: Secondary | ICD-10-CM | POA: Diagnosis not present

## 2021-03-15 DIAGNOSIS — Z1331 Encounter for screening for depression: Secondary | ICD-10-CM | POA: Diagnosis not present

## 2021-03-15 DIAGNOSIS — E782 Mixed hyperlipidemia: Secondary | ICD-10-CM | POA: Diagnosis not present

## 2021-03-15 DIAGNOSIS — J449 Chronic obstructive pulmonary disease, unspecified: Secondary | ICD-10-CM | POA: Diagnosis not present

## 2021-03-23 DIAGNOSIS — S069XAS Unspecified intracranial injury with loss of consciousness status unknown, sequela: Secondary | ICD-10-CM | POA: Diagnosis not present

## 2021-03-23 DIAGNOSIS — R519 Headache, unspecified: Secondary | ICD-10-CM | POA: Diagnosis not present

## 2021-05-05 DIAGNOSIS — Z1331 Encounter for screening for depression: Secondary | ICD-10-CM | POA: Diagnosis not present

## 2021-05-05 DIAGNOSIS — Z9181 History of falling: Secondary | ICD-10-CM | POA: Diagnosis not present

## 2021-05-05 DIAGNOSIS — E669 Obesity, unspecified: Secondary | ICD-10-CM | POA: Diagnosis not present

## 2021-05-05 DIAGNOSIS — Z0000 Encounter for general adult medical examination without abnormal findings: Secondary | ICD-10-CM | POA: Diagnosis not present

## 2021-05-05 DIAGNOSIS — E785 Hyperlipidemia, unspecified: Secondary | ICD-10-CM | POA: Diagnosis not present

## 2021-05-11 DIAGNOSIS — Z6833 Body mass index (BMI) 33.0-33.9, adult: Secondary | ICD-10-CM | POA: Diagnosis not present

## 2021-05-11 DIAGNOSIS — M7918 Myalgia, other site: Secondary | ICD-10-CM | POA: Diagnosis not present

## 2021-05-24 ENCOUNTER — Other Ambulatory Visit: Payer: Self-pay | Admitting: Cardiology

## 2021-05-24 DIAGNOSIS — E782 Mixed hyperlipidemia: Secondary | ICD-10-CM

## 2021-06-01 ENCOUNTER — Ambulatory Visit (HOSPITAL_COMMUNITY): Payer: Medicare HMO | Admitting: Psychiatry

## 2021-06-01 ENCOUNTER — Other Ambulatory Visit: Payer: Self-pay

## 2021-06-17 ENCOUNTER — Telehealth: Payer: Self-pay | Admitting: Cardiology

## 2021-06-17 NOTE — Telephone Encounter (Signed)
Spoke to the patient now. He states over the past couple of days his heart has been racing over the past several days. He states it happens periodically throughout the day and is not correlated with anything else. He checked his heart rate a couple of days ago and states that it was 102 bpm BP 174/86. He does not have any way to check this today. He states that today his heart is not racing but he feels like he has no energy and he has a cough that he thinks is from his COPD. He would like recommendations from Dr. Geraldo Pitter.

## 2021-06-17 NOTE — Telephone Encounter (Signed)
Pt c/o Shortness Of Breath: STAT if SOB developed within the last 24 hours or pt is noticeably SOB on the phone  1. Are you currently SOB (can you hear that pt is SOB on the phone)? no  2. How long have you been experiencing SOB? Bout a week  3. Are you SOB when sitting or when up moving around? Moving and around   4. Are you currently experiencing any other symptoms? Patient called in to say that he just dont feel right. That it was hard for him to explain whats going on. He did state that he had slight chest pain but it felt like somebody was sticking him with a pin.

## 2021-06-17 NOTE — Telephone Encounter (Signed)
Tried calling patient. No answer and no voicemail set up for me to leave a message. 

## 2021-06-17 NOTE — Telephone Encounter (Signed)
Spoke to the patient just now and let him know Dr. Julien Nordmann recommendations. He states that he will wait until Tuesday when he comes to see Dr. Geraldo Pitter to discuss putting the monitor on. He will see his PCP as soon as possible.    Encouraged patient to call back with any questions or concerns.

## 2021-06-17 NOTE — Telephone Encounter (Signed)
Patient is returning call.  °

## 2021-06-20 ENCOUNTER — Other Ambulatory Visit: Payer: Self-pay

## 2021-06-21 ENCOUNTER — Encounter: Payer: Self-pay | Admitting: Cardiology

## 2021-06-21 ENCOUNTER — Other Ambulatory Visit: Payer: Self-pay

## 2021-06-21 ENCOUNTER — Ambulatory Visit: Payer: Medicare HMO | Admitting: Cardiology

## 2021-06-21 VITALS — BP 114/76 | HR 60 | Ht 72.0 in | Wt 235.8 lb

## 2021-06-21 DIAGNOSIS — Z8774 Personal history of (corrected) congenital malformations of heart and circulatory system: Secondary | ICD-10-CM

## 2021-06-21 DIAGNOSIS — E782 Mixed hyperlipidemia: Secondary | ICD-10-CM

## 2021-06-21 DIAGNOSIS — R931 Abnormal findings on diagnostic imaging of heart and coronary circulation: Secondary | ICD-10-CM | POA: Diagnosis not present

## 2021-06-21 DIAGNOSIS — I10 Essential (primary) hypertension: Secondary | ICD-10-CM

## 2021-06-21 DIAGNOSIS — F17210 Nicotine dependence, cigarettes, uncomplicated: Secondary | ICD-10-CM | POA: Diagnosis not present

## 2021-06-21 MED ORDER — LISINOPRIL 40 MG PO TABS: 20.0000 mg | ORAL_TABLET | Freq: Every day | ORAL | 3 refills | Status: DC

## 2021-06-21 NOTE — Progress Notes (Signed)
Cardiology Office Note:    Date:  06/21/2021   ID:  Roy Koch 06, 1977, MRN [id number]  PCP:  [name], PA-C  Cardiologist:  Jenean Lindau, MD   Referring MD: [name], PA-C    ASSESSMENT:    1. Elevated coronary artery calcium score   2. Essential hypertension   3. Cigarette smoker   4. Mixed dyslipidemia   5. History of repair of congenital atrial septal defect (ASD)    PLAN:    In order of problems listed above:  Elevated calcium score: Secondary prevention stressed with the patient.  Importance of compliance with diet medication stressed any vocalized understanding.  He was advised to walk at least half an hour a day 5 days a week and he promises to do so. Essential hypertension: Blood pressure stable and diet was emphasized.  Lifestyle modification urged. Mixed dyslipidemia: I reviewed his lipids.  He is agreeable to start atorvastatin.  I will do a Chem-7 and liver check today.  Diabetes we will start him on pitavastatin 2 mg daily he will also continue CoQ10 and will be back in 6 weeks for liver lipid check. Cigarette smoker: I spent 5 minutes with the patient discussing solely about smoking. Smoking cessation was counseled. I suggested to the patient also different medications and pharmacological interventions. Patient is keen to try stopping on its own at this time. He will get back to me if he needs any further assistance in this matter. He has palpitations at times but occur very sporadically.  I discussed 2-week monitoring and Kardia app and he prefers the latter.  I educated him about it and he will start self monitoring and to get in touch with me for any significant findings. Patient will be seen in follow-up appointment in 6 months or earlier if the patient has any concerns    Medication Adjustments/Labs and Tests Ordered: Current medicines are reviewed at length with the patient today.  Concerns regarding medicines are outlined above.  No  orders of the defined types were placed in this encounter.  No orders of the defined types were placed in this encounter.    No chief complaint on file.    History of Present Illness:    Roy Koch is a 46 y.o. male.  Patient has past medical history of elevated calcium score, essential hypertension, mixed dyslipidemia and unfortunately continues to smoke.  He denies any problems at this time and takes care of activities of daily living.  He tells me that he could not tolerate the rosuvastatin because of crampy pain.  His primary care stopped that medication.  At the time of my evaluation, the patient is alert awake oriented and in no distress.  Past Medical History:  Diagnosis Date   Anxiety    Bipolar disorder ([place])    Bipolar disorder ([place])    Cardiac abnormality    Chest discomfort 06/18/2018   Chest tightness 10/11/2020   Cigarette smoker 06/18/2018   COPD (chronic obstructive pulmonary disease) (HCC)    Elevated coronary artery calcium score 12/13/2020   Essential hypertension 06/18/2018   Generalized anxiety disorder 09/15/2017   Headache    Heart defect, congenital    History of COVID-19    History of repair of congenital atrial septal defect (ASD) 06/18/2018   History of TIA (transient ischemic attack)    History of tobacco use    Hypertension    Hypertension, essential, benign    Low back pain 07/14/2013  Migraine headache    Mixed dyslipidemia 08/05/2020   Neck pain    Palpitations    Stroke ([place])    Tobacco abuse    Ventricular septal defect     Past Surgical History:  Procedure Laterality Date   AMPUTATION FINGER / THUMB     CARDIAC SURGERY  at 46 years old    Current Medications: Current Meds  Medication Sig   albuterol (PROVENTIL HFA;VENTOLIN HFA) 108 (90 Base) MCG/ACT inhaler Inhale 1-2 puffs into the lungs daily as needed for wheezing or shortness of breath.   ALPRAZolam (XANAX XR) 0.5 MG 24 hr tablet Take 0.5 mg by mouth at bedtime.   amLODipine  (NORVASC) 5 MG tablet Take 5 mg by mouth daily.   aspirin EC 81 MG tablet Take 81 mg by mouth daily. Swallow whole.   carbamazepine (TEGRETOL XR) 100 MG 12 hr tablet Take 1 tablet (100 mg total) by mouth 2 (two) times daily.   cholecalciferol (VITAMIN D3) 25 MCG (1000 UNIT) tablet Take 1,000 Units by mouth daily.   Coenzyme Q10 (HM COQ-10) 200 MG capsule Take 1 capsule (200 mg total) by mouth daily.   divalproex (DEPAKOTE ER) 500 MG 24 hr tablet Take 500 mg by mouth daily.   doxepin (SINEQUAN) 25 MG capsule Take 25 mg by mouth at bedtime.   Garlic Oil 810 MG TABS Take 250 mg by mouth daily.   lisinopril (ZESTRIL) 40 MG tablet Take 20 mg by mouth daily.   nitroGLYCERIN (NITROSTAT) 0.4 MG SL tablet Place 0.4 mg under the tongue every 5 (five) minutes as needed for chest pain.   omega-3 acid ethyl esters (LOVAZA) 1 g capsule TAKE 2 CAPSULES BY MOUTH 2 TIMES DAILY.   SYMBICORT 160-4.5 MCG/ACT inhaler Inhale 2 puffs into the lungs 2 (two) times daily.     Allergies:   Crestor [rosuvastatin]   Social History   Socioeconomic History   Marital status: Divorced    Spouse name: Not on file   Number of children: 2   Years of education: Not on file   Highest education level: 11th grade  Occupational History   Not on file  Tobacco Use   Smoking status: Every Day    Packs/day: 1.00    Types: Cigarettes   Smokeless tobacco: Never  Vaping Use   Vaping Use: Never used  Substance and Sexual Activity   Alcohol use: No   Drug use: No   Sexual activity: Not on file  Other Topics Concern   Not on file  Social History Narrative   Not on file   Social Determinants of Health   Financial Resource Strain: Not on file  Food Insecurity: Not on file  Transportation Needs: Not on file  Physical Activity: Not on file  Stress: Not on file  Social Connections: Not on file     Family History: The patient's family history includes Bipolar disorder in his maternal grandmother and maternal uncle;  Schizophrenia in his father.  ROS:   Please see the history of present illness.    All other systems reviewed and are negative.  EKGs/Labs/Other Studies Reviewed:    The following studies were reviewed today: I discussed my findings with the patient at length.   Recent Labs: 03/08/2021: ALT 40; BUN 7; Creatinine, Ser 0.84; Potassium 4.3; Sodium 136  Recent Lipid Panel    Component Value Date/Time   CHOL 175 03/08/2021 1415   TRIG 222 (H) 03/08/2021 1415   HDL 33 (L) 03/08/2021 1415  CHOLHDL 5.3 (H) 03/08/2021 1415   LDLCALC 104 (H) 03/08/2021 1415    Physical Exam:    VS:  BP 114/76    Pulse 60    Ht 6' (1.829 m)    Wt 235 lb 12.8 oz (107 kg)    SpO2 97%    BMI 31.98 kg/m     Wt Readings from Last 3 Encounters:  06/21/21 235 lb 12.8 oz (107 kg)  03/08/21 236 lb 3.2 oz (107.1 kg)  12/13/20 233 lb 3.2 oz (105.8 kg)     GEN: Patient is in no acute distress HEENT: Normal NECK: No JVD; No carotid bruits LYMPHATICS: No lymphadenopathy CARDIAC: Hear sounds regular, 2/6 systolic murmur at the apex. RESPIRATORY:  Clear to auscultation without rales, wheezing or rhonchi  ABDOMEN: Soft, non-tender, non-distended MUSCULOSKELETAL:  No edema; No deformity  SKIN: Warm and dry NEUROLOGIC:  Alert and oriented x 3 PSYCHIATRIC:  Normal affect   Signed, Jenean Lindau, MD  06/21/2021 4:31 PM    [facility]

## 2021-06-21 NOTE — Patient Instructions (Addendum)
Medication Instructions:  Your physician recommends that you continue on your current medications as directed. Please refer to the Current Medication list given to you today.  *If you need a refill on your cardiac medications before your next appointment, please call your pharmacy*   Lab Work: Your physician recommends that you have a BMET and lft's today in the office.  Your physician recommends that you return for lab work in: 6 weeks You need to have labs done when you are fasting.  You can come Monday through Friday 8:30 am to 12:00 pm and 1:15 to 4:30. You do not need to make an appointment as the order has already been placed. The labs you are going to have done are BMET, LFT and Lipids.  If you have labs (blood work) drawn today and your tests are completely normal, you will receive your results only by: [place] (if you have MyChart) OR A paper copy in the mail If you have any lab test that is abnormal or we need to change your treatment, we will call you to review the results.   Testing/Procedures: None ordered   Follow-Up: At [facility], you and your health needs are our priority.  As part of our continuing mission to provide you with exceptional heart care, we have created designated Provider Care Teams.  These Care Teams include your primary Cardiologist (physician) and Advanced Practice Providers (APPs -  Physician Assistants and Nurse Practitioners) who all work together to provide you with the care you need, when you need it.  We recommend signing up for the patient portal called "MyChart".  Sign up information is provided on this After Visit Summary.  MyChart is used to connect with patients for Virtual Visits (Telemedicine).  Patients are able to view lab/test results, encounter notes, upcoming appointments, etc.  Non-urgent messages can be sent to your provider as well.   To learn more about what you can do with MyChart, go to [email or web address].    Your  next appointment:   6 month(s)  The format for your next appointment:   In Person  Provider:   Jyl Heinz, MD   Other Instructions  FDA-cleared personal EKG: The worlds most clinically validated personal EKG, FDA-cleared to detect Atrial Fibrillation, Bradycardia, and Tachycardia. Evalee Mutton is the most reliable way to check in on your heart from home. Take your EKG from anywhere: Capture a medical-grade EKG in 30 seconds and get an instant analysis right on your smartphone. Evalee Mutton is small enough to fit in your pocket, so you can take it with you anywhere. Easy to use: Simply place your fingers on the sensors--no wires, patches, or gels. Recommended by doctors: A trusted resource, Evalee Mutton is the #1 doctor-recommended personal EKG with more than 100 million EKGs recorded. Save or share your EKGs: With the press of a button, email your EKGs to your doctor or save them on your phone. Works with smartphones: Compatible with Tour manager and tablets. Check our compatibility chart. FSA/HSA eligible: Purchase using an FSA or HSA account (please confirm coverage with your insurance provider). Phone clip included with purchase, a $15 value. Conveniently take your device with you wherever you go.  https://[email or web address].tn  Step One- Record your EKG strip on [facility] app.   Step two- On Kardia EKG click Download   Step three- It will prompt you to make a password for this EKG. Please make the password [email or web address] so that we can view it.   Step four-  Click on the little upload button (small box with an arrow in the middle) in the bottom left-hand corner of the screen.   Step five- Click Save to Files  Step six- Click on On my iphone and then Pages then press save in the top right-hand corner.   NOW GO TO MYCHART   Once on MyChart click Messages  Step one- Click Send a message  Step two- Click Ask a medical  question   Step three- Click Non urgent medical question   Step four- Click on [facility] name.  Step five- Click on the small paperclip at the bottom of the screen  Step six- Click Choose file  Step seven- Pick the most recent EKG strip listed.   Once uploaded send the message!

## 2021-06-22 ENCOUNTER — Other Ambulatory Visit: Payer: Self-pay | Admitting: Cardiology

## 2021-06-22 LAB — HEPATIC FUNCTION PANEL
ALT: 50 IU/L — ABNORMAL HIGH (ref 0–44)
AST: 26 IU/L (ref 0–40)
Albumin: 4.5 g/dL (ref 4.0–5.0)
Alkaline Phosphatase: 68 IU/L (ref 44–121)
Bilirubin Total: 0.4 mg/dL (ref 0.0–1.2)
Bilirubin, Direct: 0.12 mg/dL (ref 0.00–0.40)
Total Protein: 7.1 g/dL (ref 6.0–8.5)

## 2021-06-22 LAB — BASIC METABOLIC PANEL
BUN/Creatinine Ratio: 10 (ref 9–20)
BUN: 9 mg/dL (ref 6–24)
CO2: 23 mmol/L (ref 20–29)
Calcium: 9.2 mg/dL (ref 8.7–10.2)
Chloride: 99 mmol/L (ref 96–106)
Creatinine, Ser: 0.91 mg/dL (ref 0.76–1.27)
Glucose: 94 mg/dL (ref 70–99)
Potassium: 4.2 mmol/L (ref 3.5–5.2)
Sodium: 138 mmol/L (ref 134–144)
eGFR: 105 mL/min/{1.73_m2} (ref >59)

## 2021-06-22 MED ORDER — PITAVASTATIN CALCIUM 1 MG PO TABS
1.0000 mg | ORAL_TABLET | Freq: Every day | ORAL | 3 refills | Status: DC
Start: 2021-06-22 — End: 2022-06-09

## 2021-06-22 NOTE — Addendum Note (Signed)
Addended by: Truddie Hidden on: 06/22/2021 03:18 PM   Modules accepted: Orders

## 2021-06-23 ENCOUNTER — Other Ambulatory Visit: Payer: Self-pay | Admitting: Cardiology

## 2021-06-24 DIAGNOSIS — R519 Headache, unspecified: Secondary | ICD-10-CM | POA: Diagnosis not present

## 2021-06-24 DIAGNOSIS — S069XAS Unspecified intracranial injury with loss of consciousness status unknown, sequela: Secondary | ICD-10-CM | POA: Diagnosis not present

## 2021-07-01 ENCOUNTER — Other Ambulatory Visit (HOSPITAL_COMMUNITY): Payer: Self-pay | Admitting: Psychiatry

## 2021-07-04 DIAGNOSIS — J019 Acute sinusitis, unspecified: Secondary | ICD-10-CM | POA: Diagnosis not present

## 2021-07-04 DIAGNOSIS — K029 Dental caries, unspecified: Secondary | ICD-10-CM | POA: Diagnosis not present

## 2021-07-04 DIAGNOSIS — Z20822 Contact with and (suspected) exposure to covid-19: Secondary | ICD-10-CM | POA: Diagnosis not present

## 2021-07-04 DIAGNOSIS — Z6833 Body mass index (BMI) 33.0-33.9, adult: Secondary | ICD-10-CM | POA: Diagnosis not present

## 2021-08-12 ENCOUNTER — Other Ambulatory Visit (HOSPITAL_COMMUNITY): Payer: Self-pay | Admitting: Psychiatry

## 2021-08-17 ENCOUNTER — Telehealth (HOSPITAL_COMMUNITY): Payer: Self-pay

## 2021-08-17 NOTE — Telephone Encounter (Signed)
Patient's wife called requesting a refill on pt's Alprazolam (Xanax XR) 0.'5mg'$ . The pharmacy listed is [facility] on [address] in [place]. Wife is calling back to make a followup appointment for him. He hasn't been seen since September 2022. Please review and advise. Thank you ?

## 2021-08-19 DIAGNOSIS — R1032 Left lower quadrant pain: Secondary | ICD-10-CM | POA: Diagnosis not present

## 2021-08-19 DIAGNOSIS — E782 Mixed hyperlipidemia: Secondary | ICD-10-CM | POA: Diagnosis not present

## 2021-08-19 DIAGNOSIS — M545 Low back pain, unspecified: Secondary | ICD-10-CM | POA: Diagnosis not present

## 2021-08-19 DIAGNOSIS — Z6833 Body mass index (BMI) 33.0-33.9, adult: Secondary | ICD-10-CM | POA: Diagnosis not present

## 2021-08-23 ENCOUNTER — Other Ambulatory Visit (HOSPITAL_COMMUNITY): Payer: Self-pay

## 2021-08-23 MED ORDER — ALPRAZOLAM 0.5 MG PO TABS: 0.5000 mg | ORAL_TABLET | Freq: Every day | ORAL | 0 refills | Status: DC

## 2021-09-06 ENCOUNTER — Ambulatory Visit (HOSPITAL_BASED_OUTPATIENT_CLINIC_OR_DEPARTMENT_OTHER): Payer: Medicare HMO | Admitting: Psychiatry

## 2021-09-06 DIAGNOSIS — F01518 Vascular dementia, unspecified severity, with other behavioral disturbance: Secondary | ICD-10-CM | POA: Diagnosis not present

## 2021-09-06 MED ORDER — CARBAMAZEPINE ER 100 MG PO TB12: 100.0000 mg | ORAL_TABLET | Freq: Two times a day (BID) | ORAL | 6 refills | Status: DC

## 2021-09-06 NOTE — Progress Notes (Signed)
BH MD/PA/NP OP Progress Note ? ?09/06/2021 4:29 PM ?Roy Koch  ?MRN:  [id number] ? ? ? ?Visit Diagnosis: Mild neurocognitive disorder ? ?Today the patient seems to be at his baseline.  He is seen with his wife care.  The patient denies depression.  He denies mania.  He denies persistent irritability.  He is bothered a lot by his adult stepchildren who get on his nerves.  Overall he seems to be getting along fairly well.  His wife however interjects that at times she does seem to be explosive and irritable.  It turns out that he is not taking the Tegretol twice a day but he is agreeing to start.  His wife wants him to take it regularly.  The patient's hypertension is well controlled.  He has not been aggressive or violent in a long time.  He stays very active.  He works a lot on motorcycles and cars.  He does a lot of mechanical work for himself and for others.  He drinks no alcohol and uses no drugs.  He is very dedicated to his wife.  His wife recently had knee surgery and he has been taking care of her.  He is not suicidal.  He is actually functioning pretty well.  He had no neurological symptoms since his right CVA.  He shows really no evidence of focal deficits at this time. ? ?Past Medical History:  ?Past Medical History:  ?Diagnosis Date  ? Anxiety   ? Bipolar disorder ([place])   ? Bipolar disorder ([place])   ? Cardiac abnormality   ? Chest discomfort 06/18/2018  ? Chest tightness 10/11/2020  ? Cigarette smoker 06/18/2018  ? COPD (chronic obstructive pulmonary disease) ([place])   ? Elevated coronary artery calcium score 12/13/2020  ? Essential hypertension 06/18/2018  ? Generalized anxiety disorder 09/15/2017  ? Headache   ? Heart defect, congenital   ? History of COVID-19   ? History of repair of congenital atrial septal defect (ASD) 06/18/2018  ? History of TIA (transient ischemic attack)   ? History of tobacco use   ? Hypertension   ? Hypertension, essential, benign   ? Low back pain 07/14/2013  ? Migraine headache   ?  Mixed dyslipidemia 08/05/2020  ? Neck pain   ? Palpitations   ? Stroke [facility])   ? Tobacco abuse   ? Ventricular septal defect   ?  ?Past Surgical History:  ?Procedure Laterality Date  ? AMPUTATION FINGER / THUMB    ? CARDIAC SURGERY  at 46 years old  ? ? ?Family Psychiatric History: See intake H&P for full details. Reviewed, with no updates at this time. ? ? ?Family History:  ?Family History  ?Problem Relation Age of Onset  ? Schizophrenia Father   ? Bipolar disorder Maternal Uncle   ? Bipolar disorder Maternal Grandmother   ? ? ?Social History:  ?Social History  ? ?Socioeconomic History  ? Marital status: Divorced  ?  Spouse name: Not on file  ? Number of children: 2  ? Years of education: Not on file  ? Highest education level: 11th grade  ?Occupational History  ? Not on file  ?Tobacco Use  ? Smoking status: Every Day  ?  Packs/day: 1.00  ?  Types: Cigarettes  ? Smokeless tobacco: Never  ?Vaping Use  ? Vaping Use: Never used  ?Substance and Sexual Activity  ? Alcohol use: No  ? Drug use: No  ? Sexual activity: Not on file  ?Other Topics Concern  ?  Not on file  ?Social History Narrative  ? Not on file  ? ?Social Determinants of Health  ? ?Financial Resource Strain: Not on file  ?Food Insecurity: Not on file  ?Transportation Needs: Not on file  ?Physical Activity: Not on file  ?Stress: Not on file  ?Social Connections: Not on file  ? ? ?Allergies:  ?Allergies  ?Allergen Reactions  ? Crestor [Rosuvastatin]   ?  Body cramps  ? ? ?Metabolic Disorder Labs: ?No results found for: HGBA1C, MPG ?No results found for: PROLACTIN ?Lab Results  ?Component Value Date  ? CHOL 175 03/08/2021  ? TRIG 222 (H) 03/08/2021  ? HDL 33 (L) 03/08/2021  ? CHOLHDL 5.3 (H) 03/08/2021  ? LDLCALC 104 (H) 03/08/2021  ? LDLCALC 111 (H) 07/16/2020  ? ?No results found for: TSH ? ?Therapeutic Level Labs: ?No results found for: LITHIUM ?No results found for: VALPROATE ?No components found for:  CBMZ ? ?Current Medications: ?Current Outpatient  Medications  ?Medication Sig Dispense Refill  ? albuterol (PROVENTIL HFA;VENTOLIN HFA) 108 (90 Base) MCG/ACT inhaler Inhale 1-2 puffs into the lungs daily as needed for wheezing or shortness of breath.    ? ALPRAZolam (XANAX XR) 0.5 MG 24 hr tablet Take 0.5 mg by mouth at bedtime.    ? ALPRAZolam (XANAX) 0.5 MG tablet Take 1 tablet (0.5 mg total) by mouth at bedtime. 30 tablet 0  ? amLODipine (NORVASC) 5 MG tablet Take 5 mg by mouth daily.    ? aspirin EC 81 MG tablet Take 81 mg by mouth daily. Swallow whole.    ? carbamazepine (TEGRETOL XR) 100 MG 12 hr tablet Take 1 tablet (100 mg total) by mouth 2 (two) times daily. 60 tablet 6  ? cholecalciferol (VITAMIN D3) 25 MCG (1000 UNIT) tablet Take 1,000 Units by mouth daily.    ? Coenzyme Q10 (HM COQ-10) 200 MG capsule Take 1 capsule (200 mg total) by mouth daily. 90 capsule 3  ? divalproex (DEPAKOTE ER) 500 MG 24 hr tablet Take 500 mg by mouth daily.    ? doxepin (SINEQUAN) 25 MG capsule Take 25 mg by mouth at bedtime.    ? Garlic Oil 270 MG TABS Take 250 mg by mouth daily.    ? lisinopril (ZESTRIL) 40 MG tablet Take 0.5 tablets (20 mg total) by mouth daily. 45 tablet 3  ? nitroGLYCERIN (NITROSTAT) 0.4 MG SL tablet Place 0.4 mg under the tongue every 5 (five) minutes as needed for chest pain.    ? omega-3 acid ethyl esters (LOVAZA) 1 g capsule TAKE 2 CAPSULES BY MOUTH 2 TIMES DAILY. 360 capsule 4  ? Pitavastatin Calcium 1 MG TABS Take 1 tablet (1 mg total) by mouth daily. 90 tablet 3  ? SYMBICORT 160-4.5 MCG/ACT inhaler Inhale 2 puffs into the lungs 2 (two) times daily.    ? ?No current facility-administered medications for this visit.  ? ? ?Musculoskeletal: ?Strength & Muscle Tone: within normal limits ?Gait & Station: normal ?Patient leans: N/A ? ?Psychiatric Specialty Exam: ?ROS ?BH MD/PA/NP OP Progress Note ? ?09/06/2021 4:29 PM ?Roy Koch  ?MRN:  [id number] ? ?Chief Complaint: Irritability ? ?HPI: ? ? ?Visit Diagnosis: Mild neurocognitive disorder ? 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  I believe he has mood instability and I do believe it is helping Tegretol 100 mg twice daily.  He is going to go ahead and get a Tegretol level and a comprehensive metabolic panel.  His second problem  is insomnia.  The patient takes 25 mg of Xanax and sleeps well.  He will return to see me in 4 months.  I do believe he is stable at this time.  He is functioning reasonably well. ?Past Psychiatric History: See intake H&P for full details. Reviewed, with no updates at this time. ? ? ?Past Medical History:  ?Past Medical History:  ?Diagnosis Date  ? Anxiety   ? Bipolar disorder ([place])   ? Bipolar disorder ([place])   ? Cardiac abnormality   ? Chest discomfort 06/18/2018  ? Chest tightness 10/11/2020  ? Cigarette smoker 06/18/2018  ? COPD (chronic obstructive pulmonary disease) ([place])   ? Elevated coronary artery calcium score 12/13/2020  ? Essential hypertension 06/18/2018  ? Generalized anxiety disorder 09/15/2017  ? Headache   ? Heart defect, congenital   ? History of COVID-19   ? History of repair of congenital atrial septal defect (ASD) 06/18/2018  ? History of TIA (transient ischemic attack)   ? History of tobacco use   ? Hypertension   ? Hypertension, essential, benign   ? Low back pain 07/14/2013  ? Migraine headache   ? Mixed dyslipidemia 08/05/2020  ? Neck pain   ? Palpitations   ? Stroke [facility])   ? Tobacco abuse   ? Ventricular septal defect   ?  ?Past Surgical History:  ?Procedure Laterality Date  ? AMPUTATION FINGER / THUMB    ? CARDIAC SURGERY  at 46 years old  ? ? ?Family Psychiatric History: See intake H&P for full details. Reviewed, with no updates at this time. ? ? ?Family History:  ?Family History  ?Problem Relation Age of Onset  ? Schizophrenia Father   ? Bipolar disorder Maternal Uncle   ? Bipolar disorder Maternal Grandmother   ? ? ?Social History:  ?Social History  ? ?Socioeconomic History  ? Marital status: Divorced  ?  Spouse  name: Not on file  ? Number of children: 2  ? Years of education: Not on file  ? Highest education level: 11th grade  ?Occupational History  ? Not on file  ?Tobacco Use  ? Smoking status: Every Day  ?  Packs/day: 1.00  ?

## 2021-09-14 DIAGNOSIS — J449 Chronic obstructive pulmonary disease, unspecified: Secondary | ICD-10-CM | POA: Diagnosis not present

## 2021-09-14 DIAGNOSIS — F319 Bipolar disorder, unspecified: Secondary | ICD-10-CM | POA: Diagnosis not present

## 2021-09-14 DIAGNOSIS — I10 Essential (primary) hypertension: Secondary | ICD-10-CM | POA: Diagnosis not present

## 2021-09-14 DIAGNOSIS — F17210 Nicotine dependence, cigarettes, uncomplicated: Secondary | ICD-10-CM | POA: Diagnosis not present

## 2021-09-14 DIAGNOSIS — E782 Mixed hyperlipidemia: Secondary | ICD-10-CM | POA: Diagnosis not present

## 2021-09-14 DIAGNOSIS — R7309 Other abnormal glucose: Secondary | ICD-10-CM | POA: Diagnosis not present

## 2021-09-14 DIAGNOSIS — F411 Generalized anxiety disorder: Secondary | ICD-10-CM | POA: Diagnosis not present

## 2021-09-16 ENCOUNTER — Telehealth: Payer: Self-pay

## 2021-09-16 NOTE — Telephone Encounter (Signed)
? ?  Pre-operative Risk Assessment  ?  ?Patient Name: Roy Koch  ?DOB: 1976/05/27 ?MRN: [id number]  ? ? ? ?Request for Surgical Clearance   ? ?Procedure:  Dental Extraction - Amount of Teeth to be Pulled:  2 ? ?Date of Surgery:  Clearance TBD                              ?   ?Surgeon:   ?Surgeon's Group or Practice Name:  [facility] ?Phone number:  [phone] ?Fax number:  [phone] ?  ?Type of Clearance Requested:   ?- Medical  ?  ?Type of Anesthesia:  Local  ?  ?Additional requests/questions:  Does this patient need antibiotics? ? ?Signed, ?Lenn Volker Tressa Busman   ?09/16/2021, 4:11 PM  ? ?

## 2021-09-19 NOTE — Telephone Encounter (Signed)
SBE is only needed within the first 6 months of repair or  if their is residual shunts or regurgitation after repair.  ?Patient does not need SBE prophylaxis.  ?

## 2021-09-19 NOTE — Telephone Encounter (Signed)
? ?  Patient Name: Roy Koch  ?DOB: 1975-09-08 ?MRN: [id number] ? ?Primary Cardiologist: None ? ?Chart reviewed as part of pre-operative protocol coverage.  ? ?Dental extractions (i.e. 1-2 teeth) are considered low risk procedures per guidelines and generally do not require any specific cardiac clearance. It is also generally accepted that for simple extractions and dental cleanings, there is no need to interrupt blood thinner therapy. ? ? ?SBE prophylaxis is required for the patient from a cardiac standpoint. He has a history of congenital ASD repair.  ? ?I will route this recommendation to the requesting party via Epic fax function and remove from pre-op pool. ? ?Please call with questions. ? ?Ledora Bottcher, PA ?09/19/2021, 9:23 AM ? ?

## 2021-09-21 DIAGNOSIS — Z1211 Encounter for screening for malignant neoplasm of colon: Secondary | ICD-10-CM | POA: Diagnosis not present

## 2021-09-21 DIAGNOSIS — R1030 Lower abdominal pain, unspecified: Secondary | ICD-10-CM | POA: Diagnosis not present

## 2021-09-22 ENCOUNTER — Other Ambulatory Visit (HOSPITAL_COMMUNITY): Payer: Self-pay | Admitting: Psychiatry

## 2021-09-27 DIAGNOSIS — R1030 Lower abdominal pain, unspecified: Secondary | ICD-10-CM | POA: Diagnosis not present

## 2021-09-27 DIAGNOSIS — K7689 Other specified diseases of liver: Secondary | ICD-10-CM | POA: Diagnosis not present

## 2021-09-28 ENCOUNTER — Telehealth: Payer: Self-pay | Admitting: Cardiology

## 2021-09-28 NOTE — Telephone Encounter (Signed)
Roy Koch calling to f/u on Medical Clearance that was done on 09/16/21. She states that pt is due to have procedure done next week and has yet to hear anything back regarding clearance. Please advise ?

## 2021-09-28 NOTE — Telephone Encounter (Signed)
? ?  Patient Name: Roy Koch  ?DOB: 1975-08-06 ?MRN: [id number] ? ?Primary Cardiologist: Dr. Geraldo Pitter ? ?Chart reviewed as part of pre-operative protocol coverage. Preop clearance was addressed in 09/16/21 phone note. ? ?Per Fabian Sharp PA-C, "Dental extractions (i.e. 1-2 teeth) are considered low risk procedures per guidelines and generally do not require any specific cardiac clearance. It is also generally accepted that for simple extractions and dental cleanings, there is no need to interrupt blood thinner therapy." She did review the question of SBE prophylaxis with our pharmacy team who stated, "SBE is only needed within the first 6 months of repair or  if their is residual shunts or regurgitation after repair.  ?Patient does not need SBE prophylaxis."  ? ?It does look like this was faxed on 09/20/21 by PA Bhagat. I will route this msg to callback to clarify with dental office where this needs to be re-faxed to. Please call with questions. ? ?Charlie Pitter, PA-C ?09/28/2021, 10:51 AM ? ?

## 2021-09-28 NOTE — Telephone Encounter (Signed)
Spoke with Anderson Malta and she gave me the fax number and email address. [phone] or [email or web address])  ? ?Copy of clearance printed, emailed and faxed to [facility]. ?

## 2021-09-29 NOTE — Telephone Encounter (Signed)
?  [facility] calling back, they wanted to let us know they are going to send a new preop clearance because the pt is having 5 teeth extractions instead 2 teeth  ?

## 2021-09-30 NOTE — Telephone Encounter (Signed)
Hi Dr. Geraldo Pitter,  ? ?Roy Koch is scheduled to have 5 teeth pulled soon. This is to be done under local anesthesia. I know dental procedures are usually very low risk and requiring no specific clearance but I gave him a call today just to make sure he was doing OK since he is having multiple teeth pulled. He reports that on Wednesday he started having stroke like symptoms including a headache that felt like someone was hitting his head with a "sledge hammer" with associated slurred speech and vision changes. He also had chest pain that he describes as an "elephant sitting on his chest" with some associated arm numbness. The chest pain lasted for about half a day and then resolved. Slurred speech lasted until yesterday. He is still having a headache and has been staying in a dark room the last 2 days. He is currently chest pain free and denies any shortness of breath. I expressed concern that he was having stroke like symptoms. He said he went to the [facility] ED but said that did not do any imaging of his head. He said he is planning on having a head MRI later tonight. I encouraged him not to wait and emphasized the importance of being evaluated ASAP and having imaging of his head. He insisted on waiting until his wife got home. He is clearly outside the window of tPA. I again emphasized the importance of being seen ASAP and told him to call 911 if symptoms worsen which he agreed to. ? ?So in regards to his teeth extractions, what would you recommend at this time? Do you want to see him in the office beforehand? If he is OK to proceed, can you please comment on how long he can hold Aspirin? ? ?Please route response back to P CV DIV PREOP.  ? ?Thank you! ?Akeira Lahm ?

## 2021-10-03 ENCOUNTER — Other Ambulatory Visit (HOSPITAL_COMMUNITY): Payer: Self-pay | Admitting: *Deleted

## 2021-10-03 DIAGNOSIS — F17210 Nicotine dependence, cigarettes, uncomplicated: Secondary | ICD-10-CM | POA: Diagnosis not present

## 2021-10-03 DIAGNOSIS — J301 Allergic rhinitis due to pollen: Secondary | ICD-10-CM | POA: Diagnosis not present

## 2021-10-03 DIAGNOSIS — J449 Chronic obstructive pulmonary disease, unspecified: Secondary | ICD-10-CM | POA: Diagnosis not present

## 2021-10-03 DIAGNOSIS — G2581 Restless legs syndrome: Secondary | ICD-10-CM | POA: Diagnosis not present

## 2021-10-03 DIAGNOSIS — G4733 Obstructive sleep apnea (adult) (pediatric): Secondary | ICD-10-CM | POA: Diagnosis not present

## 2021-10-03 MED ORDER — ALPRAZOLAM ER 0.5 MG PO TB24: 0.5000 mg | ORAL_TABLET | Freq: Every day | ORAL | 2 refills | Status: DC

## 2021-10-03 MED ORDER — CARBAMAZEPINE ER 200 MG PO TB12: 200.0000 mg | ORAL_TABLET | Freq: Two times a day (BID) | ORAL | 2 refills | Status: DC

## 2021-10-03 NOTE — Telephone Encounter (Signed)
S/w Pt and spouse pt is having surgery on May 10 th per Dr.Revankar pt needs a pre-op appt. Only appt available Dr.Tobb on May 9 at [facility].  ?

## 2021-10-03 NOTE — Telephone Encounter (Signed)
Pre-op covering team, please see Dr. Julien Nordmann message below. Can we please arrange an office visit with Dr. Geraldo Pitter.  ? ?Thank you! ?

## 2021-10-04 ENCOUNTER — Ambulatory Visit: Payer: Medicare HMO | Admitting: Cardiology

## 2021-10-04 ENCOUNTER — Encounter: Payer: Self-pay | Admitting: Cardiology

## 2021-10-04 VITALS — BP 108/72 | HR 88 | Ht 72.0 in | Wt 239.0 lb

## 2021-10-04 DIAGNOSIS — E782 Mixed hyperlipidemia: Secondary | ICD-10-CM

## 2021-10-04 DIAGNOSIS — R4781 Slurred speech: Secondary | ICD-10-CM | POA: Diagnosis not present

## 2021-10-04 DIAGNOSIS — I10 Essential (primary) hypertension: Secondary | ICD-10-CM | POA: Diagnosis not present

## 2021-10-04 DIAGNOSIS — Z8673 Personal history of transient ischemic attack (TIA), and cerebral infarction without residual deficits: Secondary | ICD-10-CM | POA: Diagnosis not present

## 2021-10-04 DIAGNOSIS — I2510 Atherosclerotic heart disease of native coronary artery without angina pectoris: Secondary | ICD-10-CM | POA: Diagnosis not present

## 2021-10-04 DIAGNOSIS — Z01818 Encounter for other preprocedural examination: Secondary | ICD-10-CM

## 2021-10-04 DIAGNOSIS — I2584 Coronary atherosclerosis due to calcified coronary lesion: Secondary | ICD-10-CM

## 2021-10-04 DIAGNOSIS — F17200 Nicotine dependence, unspecified, uncomplicated: Secondary | ICD-10-CM | POA: Diagnosis not present

## 2021-10-04 NOTE — Telephone Encounter (Signed)
Dr. Harriet Masson called and spoke with Roy Koch and noticed the office the pt has stroke like symptoms that need to be addressed before he can be cleared for surgery due to his history of stroke. The dental office requested the notes from today.  ?

## 2021-10-04 NOTE — Patient Instructions (Addendum)
Medication Instructions:  ?Your physician recommends that you continue on your current medications as directed. Please refer to the Current Medication list given to you today.  ?*If you need a refill on your cardiac medications before your next appointment, please call your pharmacy* ? ? ?Lab Work: ?None ?If you have labs (blood work) drawn today and your tests are completely normal, you will receive your results only by: ?MyChart Message (if you have MyChart) OR ?A paper copy in the mail ?If you have any lab test that is abnormal or we need to change your treatment, we will call you to review the results. ? ? ?Testing/Procedures: ?None ? ? ?Follow-Up: ?At [facility], you and your health needs are our priority.  As part of our continuing mission to provide you with exceptional heart care, we have created designated Provider Care Teams.  These Care Teams include your primary Cardiologist (physician) and Advanced Practice Providers (APPs -  Physician Assistants and Nurse Practitioners) who all work together to provide you with the care you need, when you need it. ? ?We recommend signing up for the patient portal called "MyChart".  Sign up information is provided on this After Visit Summary.  MyChart is used to connect with patients for Virtual Visits (Telemedicine).  Patients are able to view lab/test results, encounter notes, upcoming appointments, etc.  Non-urgent messages can be sent to your provider as well.   ?To learn more about what you can do with MyChart, go to [email or web address].   ? ?Your next appointment:   ?As scheduled   ? ?The format for your next appointment:   ?In Person ? ?Provider:   ?Jyl Heinz, MD  ? ? ?Other Instructions ? ?Unable to clear you for your dental surgery today for concern of stroke like symptoms that need to be addressed. You may go to the Emergency Department or call your neurologist for an urgent appointment.  ? ?Important Information About [place] ? ? ? ? ?  ?

## 2021-10-04 NOTE — Telephone Encounter (Signed)
[facility] calling to request the clearance for the pt. They said the pt's surgery is for tomorrow.  ?

## 2021-10-06 NOTE — Progress Notes (Signed)
?Cardiology Office Note:   ? ?Date:  10/09/2021  ? ?ID:  Roy Koch, DOB 03/12/1976, MRN [id number] ? ?PCP:  Roy Bender, PA-C  ?Cardiologist:  None  ?Electrophysiologist:  None  ? ?Referring MD: Roy Bender, PA-C  ? ?" I had slurred speech and my lef ? ? ?History of Present Illness:   ? ?Roy Koch is a 46 y.o. 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  The patient generally follows with Dr. Geraldo Koch and was last seen by him on October 2022. ? ?He is here today because he is planning surgery.  And had asked to come for presurgical clearance. ? ?Of note the patient tells me is documented in his chart that on May 4 he experience symptoms with significant headache and felt as if a sledgehammer has split his head and he had associated slurred speech and visual changes.  He also noted that he has some chest pain and numbness.  Unfortunately the patient did not seek medical care.  He was contacted by our office on May 5 in the preliminary fracture call for his preop clearance and he reported this.  At that time he was asked to go to the emergency department or see his PCP.  The patient did not do any of those. ? ?He is here today with his wife.  He tells me that he did not want to go to the hospital.  He also had not seen his PCP or neurologist since. ? ? ?Past Medical History:  ?Diagnosis Date  ? Anxiety   ? Bipolar disorder ([place])   ? Bipolar disorder ([place])   ? Cardiac abnormality   ? Chest discomfort 06/18/2018  ? Chest tightness 10/11/2020  ? Cigarette smoker 06/18/2018  ? COPD (chronic obstructive pulmonary disease) ([place])   ? Elevated coronary artery calcium score 12/13/2020  ? Essential hypertension 06/18/2018  ? Generalized anxiety disorder 09/15/2017  ? Headache   ? Heart defect, congenital   ? History of COVID-19   ? History of repair of  congenital atrial septal defect (ASD) 06/18/2018  ? History of TIA (transient ischemic attack)   ? History of tobacco use   ? Hypertension   ? Hypertension, essential, benign   ? Low back pain 07/14/2013  ? Migraine headache   ? Mixed dyslipidemia 08/05/2020  ? Neck pain   ? Palpitations   ? Stroke [facility])   ? Tobacco abuse   ? Ventricular septal defect   ? ? ?Past Surgical History:  ?Procedure Laterality Date  ? AMPUTATION FINGER / THUMB    ? CARDIAC SURGERY  at 46 years old  ? ? ?Current Medications: ?Current Meds  ?Medication Sig  ? albuterol (PROVENTIL HFA;VENTOLIN HFA) 108 (90 Base) MCG/ACT inhaler Inhale 1-2 puffs into the lungs daily as needed for wheezing or shortness of breath.  ? ALPRAZolam (XANAX XR) 0.5 MG 24 hr tablet Take 1 tablet (0.5 mg total) by mouth at bedtime.  ? amLODipine (NORVASC) 5 MG tablet Take 5 mg by mouth daily.  ? aspirin EC 81 MG tablet Take 81 mg by mouth daily. Swallow whole.  ? cholecalciferol (VITAMIN D3) 25 MCG (1000 UNIT) tablet Take 1,000 Units by mouth daily.  ? Coenzyme Q10 (HM COQ-10) 200 MG capsule Take 1 capsule (200 mg total) by mouth daily.  ? divalproex (DEPAKOTE ER) 500  MG 24 hr tablet Take 500 mg by mouth daily.  ? doxepin (SINEQUAN) 25 MG capsule Take 25 mg by mouth at bedtime.  ? Garlic Oil 413 MG TABS Take 250 mg by mouth daily.  ? lisinopril (ZESTRIL) 40 MG tablet Take 0.5 tablets (20 mg total) by mouth daily.  ? nitroGLYCERIN (NITROSTAT) 0.4 MG SL tablet Place 0.4 mg under the tongue every 5 (five) minutes as needed for chest pain.  ? omega-3 acid ethyl esters (LOVAZA) 1 g capsule TAKE 2 CAPSULES BY MOUTH 2 TIMES DAILY.  ? SYMBICORT 160-4.5 MCG/ACT inhaler Inhale 2 puffs into the lungs 2 (two) times daily.  ?  ? ?Allergies:   Crestor [rosuvastatin]  ? ?Social History  ? ?Socioeconomic History  ? Marital status: Divorced  ?  Spouse name: Not on file  ? Number of children: 2  ? Years of education: Not on file  ? Highest education level: 11th grade  ?Occupational History   ? Not on file  ?Tobacco Use  ? Smoking status: Every Day  ?  Packs/day: 1.00  ?  Types: Cigarettes  ? Smokeless tobacco: Never  ?Vaping Use  ? Vaping Use: Never used  ?Substance and Sexual Activity  ? Alcohol use: No  ? Drug use: No  ? Sexual activity: Not on file  ?Other Topics Concern  ? Not on file  ?Social History Narrative  ? Not on file  ? ?Social Determinants of Health  ? ?Financial Resource Strain: Not on file  ?Food Insecurity: Not on file  ?Transportation Needs: Not on file  ?Physical Activity: Not on file  ?Stress: Not on file  ?Social Connections: Not on file  ?  ? ?Family History: ?The patient's family history includes Bipolar disorder in his maternal grandmother and maternal uncle; Schizophrenia in his father. ? ?ROS:   ?Review of Systems  ?Constitution: Negative for decreased appetite, fever and weight gain.  ?HENT: Negative for congestion, ear discharge, hoarse voice and sore throat.   ?Eyes: Negative for discharge, redness, vision loss in right eye and visual halos.  ?Cardiovascular: Negative for chest pain, dyspnea on exertion, leg swelling, orthopnea and palpitations.  ?Respiratory: Negative for cough, hemoptysis, shortness of breath and snoring.   ?Endocrine: Negative for heat intolerance and polyphagia.  ?Hematologic/Lymphatic: Negative for bleeding problem. Does not bruise/bleed easily.  ?Skin: Negative for flushing, nail changes, rash and suspicious lesions.  ?Musculoskeletal: Negative for arthritis, joint pain, muscle cramps, myalgias, neck pain and stiffness.  ?Gastrointestinal: Negative for abdominal pain, bowel incontinence, diarrhea and excessive appetite.  ?Genitourinary: Negative for decreased libido, genital sores and incomplete emptying.  ?Neurological: Negative for brief paralysis, focal weakness, headaches and loss of balance.  ?Psychiatric/Behavioral: Negative for altered mental status, depression and suicidal ideas.  ?Allergic/Immunologic: Negative for HIV exposure and persistent  infections.  ? ? ?EKGs/Labs/Other Studies Reviewed:   ? ?The following studies were reviewed today: ? ? ?EKG:  The ekg ordered today demonstrates sinus rhythm, heart rate 88 bpm. ? ?Zio monitor ?The left ventricular ejection fraction is mildly decreased (45-54%). ?Nuclear stress EF: 51%. ?There was no ST segment deviation noted during stress. ?Defect 1: There is a small defect of mild severity present in the apex location. ?No evidence of ischemia or MI. ?Fixed defect involving apex most likely related to apical thining. ? ?Recent Labs: ?06/21/2021: ALT 50; BUN 9; Creatinine, Ser 0.91; Potassium 4.2; Sodium 138  ?Recent Lipid Panel ?   ?Component Value Date/Time  ? CHOL 175 03/08/2021 1415  ? TRIG  222 (H) 03/08/2021 1415  ? HDL 33 (L) 03/08/2021 1415  ? CHOLHDL 5.3 (H) 03/08/2021 1415  ? LDLCALC 104 (H) 03/08/2021 1415  ? ? ?Physical Exam:   ? ?VS:  BP 108/72   Pulse 88   Ht 6' (1.829 m)   Wt 239 lb (108.4 kg)   SpO2 94%   BMI 32.41 kg/m?    ? ?Wt Readings from Last 3 Encounters:  ?10/04/21 239 lb (108.4 kg)  ?06/21/21 235 lb 12.8 oz (107 kg)  ?03/08/21 236 lb 3.2 oz (107.1 kg)  ?  ? ?GEN: Well nourished, well developed in no acute distress ?HEENT: Normal ?NECK: No JVD; No carotid bruits ?LYMPHATICS: No lymphadenopathy ?CARDIAC: S1S2 noted,RRR, no murmurs, rubs, gallops ?RESPIRATORY:  Clear to auscultation without rales, wheezing or rhonchi  ?ABDOMEN: Soft, non-tender, non-distended, +bowel sounds, no guarding. ?EXTREMITIES: No edema, No cyanosis, no clubbing ?MUSCULOSKELETAL:  No deformity  ?SKIN: Warm and dry ?NEUROLOGIC:  Alert and oriented x 3, non-focal ?PSYCHIATRIC:  Normal affect, good insight ? ?ASSESSMENT:   ? ?1. Pre-op evaluation   ?2. Coronary artery calcification of native artery   ?3. History of CVA (cerebrovascular accident)   ?4. Essential hypertension   ?5. Mixed hyperlipidemia   ?6. Recent Slurred speech   ?7. Smoker   ? ?PLAN:   ? ? ? Unfortunately with his recent symptoms concerning for  stroke and the fact that he has not been worked up I am unable to clear this patient for his upcoming procedure.  I am concerned and have expressed this to the patient and his wife.  He needs imaging-he tells me that

## 2021-10-09 DIAGNOSIS — I2510 Atherosclerotic heart disease of native coronary artery without angina pectoris: Secondary | ICD-10-CM | POA: Insufficient documentation

## 2021-10-09 DIAGNOSIS — Z8673 Personal history of transient ischemic attack (TIA), and cerebral infarction without residual deficits: Secondary | ICD-10-CM

## 2021-10-09 DIAGNOSIS — Z01818 Encounter for other preprocedural examination: Secondary | ICD-10-CM | POA: Insufficient documentation

## 2021-10-09 DIAGNOSIS — E782 Mixed hyperlipidemia: Secondary | ICD-10-CM | POA: Insufficient documentation

## 2021-10-09 HISTORY — DX: Atherosclerotic heart disease of native coronary artery without angina pectoris: I25.10

## 2021-10-09 HISTORY — DX: Personal history of transient ischemic attack (TIA), and cerebral infarction without residual deficits: Z86.73

## 2021-10-09 HISTORY — DX: Mixed hyperlipidemia: E78.2

## 2021-10-09 HISTORY — DX: Encounter for other preprocedural examination: Z01.818

## 2021-10-10 DIAGNOSIS — G9389 Other specified disorders of brain: Secondary | ICD-10-CM | POA: Diagnosis not present

## 2021-10-10 DIAGNOSIS — Z8673 Personal history of transient ischemic attack (TIA), and cerebral infarction without residual deficits: Secondary | ICD-10-CM | POA: Diagnosis not present

## 2021-10-10 DIAGNOSIS — R519 Headache, unspecified: Secondary | ICD-10-CM | POA: Diagnosis not present

## 2021-10-10 DIAGNOSIS — R29818 Other symptoms and signs involving the nervous system: Secondary | ICD-10-CM | POA: Diagnosis not present

## 2021-10-10 DIAGNOSIS — S069XAS Unspecified intracranial injury with loss of consciousness status unknown, sequela: Secondary | ICD-10-CM | POA: Diagnosis not present

## 2021-10-13 ENCOUNTER — Other Ambulatory Visit: Payer: Self-pay | Admitting: Cardiology

## 2021-10-13 DIAGNOSIS — G4733 Obstructive sleep apnea (adult) (pediatric): Secondary | ICD-10-CM | POA: Diagnosis not present

## 2021-10-13 DIAGNOSIS — R5383 Other fatigue: Secondary | ICD-10-CM | POA: Diagnosis not present

## 2021-10-13 DIAGNOSIS — J4540 Moderate persistent asthma, uncomplicated: Secondary | ICD-10-CM | POA: Diagnosis not present

## 2021-10-13 DIAGNOSIS — G2581 Restless legs syndrome: Secondary | ICD-10-CM | POA: Diagnosis not present

## 2021-10-13 DIAGNOSIS — F17210 Nicotine dependence, cigarettes, uncomplicated: Secondary | ICD-10-CM | POA: Diagnosis not present

## 2021-10-13 DIAGNOSIS — J301 Allergic rhinitis due to pollen: Secondary | ICD-10-CM | POA: Diagnosis not present

## 2021-10-13 NOTE — Telephone Encounter (Signed)
   Name: Roy Koch  DOB: [birth year]  MRN: [id number]  Primary Cardiologist: Dr. Geraldo Pitter  Chart reviewed as part of pre-operative protocol coverage. Because of [name]'s past medical history and time since last visit, he will require a follow-up in-office visit in order to better assess preoperative cardiovascular risk. At recent [place] 10/04/21 he was seen for pre-op evaluation and there were concerns for stroke like symptoms. He was undergoing further workup with primary care. Needs repeat OV to review outside testing and MD evaluation before clearing for surgery. We cannot comment on holding antiplaletets or surgical clearance until all data is available. Would make sure pt confirms he followed up with primary care for MRI and have those records available for updated visit with [name]. If he chooses not to pursue stroke workup, would let dental office know at this time we cannot safely provide pre-operative recommendations.  Pre-op covering staff: - Please schedule appointment and call patient to inform them. - Please contact requesting surgeon's office via preferred method (i.e, phone, fax) to inform them of need for appointment prior to surgery.   Charlie Pitter, PA-C  10/13/2021, 8:30 AM

## 2021-10-13 NOTE — Telephone Encounter (Signed)
LM for pt to call back to schedule In-office appt for surgical clearance.

## 2021-10-14 NOTE — Telephone Encounter (Signed)
Sent another staff message to check and see if able to get the pt in sooner.

## 2021-10-14 NOTE — Telephone Encounter (Signed)
Will send a message to Roy Koch [facility] office to see if they may be able to get the pt in sooner than July. See notes in regard to stroke like symptoms and ov note from Dr. Harriet Masson. See notes from pre op provider Melina Copa, PAC as well.

## 2021-10-17 NOTE — Telephone Encounter (Signed)
Pt has sooner appt with Dr. Geraldo Pitter 10/20/21. See previous notes. I will forward clearance information to MD for upcoming appt. Will send FYI to requesting office the pt has appt 10/20/21.

## 2021-10-20 ENCOUNTER — Encounter: Payer: Self-pay | Admitting: Cardiology

## 2021-10-20 ENCOUNTER — Ambulatory Visit: Payer: Medicare HMO | Admitting: Cardiology

## 2021-10-20 VITALS — BP 116/60 | HR 62 | Ht 72.0 in | Wt 240.0 lb

## 2021-10-20 DIAGNOSIS — R931 Abnormal findings on diagnostic imaging of heart and coronary circulation: Secondary | ICD-10-CM

## 2021-10-20 DIAGNOSIS — Z87891 Personal history of nicotine dependence: Secondary | ICD-10-CM | POA: Diagnosis not present

## 2021-10-20 DIAGNOSIS — I10 Essential (primary) hypertension: Secondary | ICD-10-CM

## 2021-10-20 DIAGNOSIS — E782 Mixed hyperlipidemia: Secondary | ICD-10-CM | POA: Diagnosis not present

## 2021-10-20 DIAGNOSIS — Z01810 Encounter for preprocedural cardiovascular examination: Secondary | ICD-10-CM | POA: Diagnosis not present

## 2021-10-20 NOTE — Patient Instructions (Signed)
Medication Instructions:  Your physician recommends that you continue on your current medications as directed. Please refer to the Current Medication list given to you today.  *If you need a refill on your cardiac medications before your next appointment, please call your pharmacy*   Lab Work: None ordered If you have labs (blood work) drawn today and your tests are completely normal, you will receive your results only by: [place] (if you have MyChart) OR A paper copy in the mail If you have any lab test that is abnormal or we need to change your treatment, we will call you to review the results.   Testing/Procedures: Stress Echocardiogram Information Sheet Instructions:  This test will be done at [facility].  Nothing to eat or drink after midnight.  Dress prepared to exercise.  Please bring all current prescription medications.   Follow-Up: At [facility], you and your health needs are our priority.  As part of our continuing mission to provide you with exceptional heart care, we have created designated Provider Care Teams.  These Care Teams include your primary Cardiologist (physician) and Advanced Practice Providers (APPs -  Physician Assistants and Nurse Practitioners) who all work together to provide you with the care you need, when you need it.  We recommend signing up for the patient portal called "MyChart".  Sign up information is provided on this After Visit Summary.  MyChart is used to connect with patients for Virtual Visits (Telemedicine).  Patients are able to view lab/test results, encounter notes, upcoming appointments, etc.  Non-urgent messages can be sent to your provider as well.   To learn more about what you can do with MyChart, go to [email or web address].    Your next appointment:   6 month(s)  The format for your next appointment:   In Person  Provider:   Jyl Heinz, MD   Other Instructions Exercise Stress Echocardiogram An  exercise stress echocardiogram is a test to check how well your heart is working. This test uses sound waves (ultrasound) and a computer to make images of your heart before and after exercise. Ultrasound images that are taken before you exercise (resting echocardiogram) will show how much blood is getting to your heart muscle and how well yourheart muscle and heart valves are functioning. During the next part of this test, you will walk on a treadmill or ride a stationary bicycle to see how exercise affects your heart. While you exercise, the electrical activity of your heart will be monitored with anelectrocardiogram (ECG). Your blood pressure will also be monitored. You may have this test if you have: Chest pain or other symptoms of a heart problem. Recently had a heart attack or heart surgery. Heart valve problems. A condition that causes narrowing of the blood vessels that supply your heart (coronary artery disease). A high risk of heart disease and are starting a new exercise program. A high risk of heart disease and need to have major surgery. Heart arrhythmia (abnormal heart rhythm) problems. Heart failure problems. Tell a health care provider about: Any allergies you have. All medicines you are taking, including vitamins, herbs, eye drops, creams, and over-the-counter medicines. Any problems you or family members have had with anesthetic medicines. Any surgeries you have had. Any blood disorders you have. Any medical conditions you have. Whether you are pregnant or may be pregnant. What are the risks? Generally, this is a safe test. However, problems may occur, including: Chest pain. Dizziness or light-headedness. Shortness of breath. Increased or irregular  heartbeat (palpitations). Nausea or vomiting. Heart attack. This is very rare. What happens before the test? Medicines Ask your health care provider about changing or stopping your regular medicines. This is especially  important if you are taking diabetes medicines or blood thinners. If you use an inhaler, bring it with you to the test. General instructions Wear loose, comfortable clothing and walking shoes. Follow instructions from your health care provider about eating or drinking restrictions. You may be asked to avoid all forms of caffeine for 24 hours before your test, or as told by your health care provider. Do not use any products that contain nicotine or tobacco for 4 hours before the test, or as told by your health care provider. These products include cigarettes, chewing tobacco, and vaping devices, such as e-cigarettes. If you need help quitting, ask your health care provider. What happens during the test?  You will take off your clothes from the waist up and put on a hospital gown. Electrodes or electrocardiogram (ECG) patches may be placed on your chest. The electrodes or patches are then connected to a device that monitors your heart rate and rhythm. A blood pressure cuff will be placed on your arm. You will lie down on a table for an ultrasound exam before you exercise. A gel will be applied to your chest to help sound waves pass through your skin. A handheld device, called a transducer, will be pressed against your chest and moved over your heart. The transducer produces sound waves that travel to your heart and bounce back (or "echo" back) to the transducer. These sound waves will be captured in real-time and changed into images of your heart that can be viewed on a video monitor. The images will be recorded on a computer and reviewed by your health care provider. Once the ultrasound is complete, you will start exercising by walking on a treadmill or pedaling a stationary bicycle. Your blood pressure and heart rhythm will be monitored while you exercise. The exercise will gradually get harder or faster. You will exercise until: Your heart reaches a target level. You are too tired to  continue. You cannot continue because of chest pain, weakness, or dizziness. You will have another ultrasound exam immediately after you stop exercising. The procedure may vary among health care providers and hospitals. What can I expect after the test? After your test, it is common to have: Mild soreness. Mild fatigue. Your heart rate and blood pressure will be monitored until they return to yournormal levels. You should not have any new symptoms after this test. Follow these instructions at home: After your stress test, you should be able to return to your normal activities and diet. Take over-the-counter and prescription medicines only as told by your health care provider. Keep all follow-up visits. This is important. It is up to you to get the results of your test. Ask your health care provider, or the department that is doing the test, when your results will be ready. Contact a health care provider if you: Feel dizzy or light-headed. Have a fast or irregular heartbeat. Have nausea or vomiting. Have a headache. Feel short of breath. Get help right away if you: Develop pain or pressure: In your chest. In your jaw or neck. Between your shoulder blades. Radiating down your left arm. Faint. Have trouble breathing. These symptoms may represent a serious problem that is an emergency. Do not wait to see if the symptoms will go away. Get medical help right away. Call   your local emergency services (911 in the [place] Do not drive yourself to the hospital. Summary An exercise stress echocardiogram is a test that uses ultrasound to check how well your heart works before and after exercise. Before the test, follow instructions from your health care provider about stopping medicines and avoiding caffeine, nicotine and tobacco, and certain foods and drinks. During the test, your blood pressure and heart rhythm will be monitored while you exercise on a treadmill or stationary bicycle. This  information is not intended to replace advice given to you by your health care provider. Make sure you discuss any questions you have with your healthcare provider. Document Revised: 01/06/2020 Document Reviewed: 01/06/2020 Elsevier Patient Education  2022 [employer].

## 2021-10-20 NOTE — Progress Notes (Addendum)
Cardiology Office Note:    Date:  10/20/2021   ID:  Roy Koch, Roy Koch 1975/10/16, MRN [id number]  PCP:  [name], PA-C  Cardiologist:  Jenean Lindau, MD   Referring MD: [name], PA-C    ASSESSMENT:    1. Pre-operative cardiovascular examination   2. Elevated coronary artery calcium score   3. Essential hypertension   4. Mixed hyperlipidemia   5. History of tobacco use   6. Mixed dyslipidemia    PLAN:    In order of problems listed above:  Primary prevention stressed with the patient.  Importance of compliance with diet medication stressed any vocalized understanding. Elevated calcium score: On statin therapy and lipids followed by primary care.  Diet emphasized. Cigarette smoker: I spent 5 minutes with the patient discussing solely about smoking. Smoking cessation was counseled. I suggested to the patient also different medications and pharmacological interventions. Patient is keen to try stopping on its own at this time. He will get back to me if he needs any further assistance in this matter. Preop assessment: For this I would like him to undergo an exercise stress echo because of his symptoms and elevated calcium score and is agreeable.  If this test is negative then he is fine from a cardiovascular aspect for the surgery.  He will still need clearance from his primary care and neurology for his strokelike symptoms at that evaluation. Essential hypertension: Blood pressure stable and diet was emphasized. Mixed dyslipidemia: On statin therapy and followed by primary care.  Lipids reviewed.  Goal LDL must be less than 70. Patient will be seen in follow-up appointment in 6 months or earlier if the patient has any concerns  Addendum November 10, 2021 I reviewed stress test findings and he did well and his stress test was fine and based on this is not at high risk for coronary events during the aforementioned surgery.  Meticulous hemodynamic monitoring will further reduce  the risk of coronary events. Signed Dr. Sunny Schlein Jathan Balling 12:01 PM    Medication Adjustments/Labs and Tests Ordered: Current medicines are reviewed at length with the patient today.  Concerns regarding medicines are outlined above.  Orders Placed This Encounter  Procedures   ECHOCARDIOGRAM STRESS TEST   No orders of the defined types were placed in this encounter.    No chief complaint on file.    History of Present Illness:    Roy Koch is a 46 y.o. male.  Patient has past medical history of elevated calcium score, essential hypertension, mixed dyslipidemia and cigarette smoking.  Recently he thought he had a strokelike symptoms and he has been evaluated for this by primary care and neurology.  Patient also had an episode of chest pain at that time.  Subsequently is done fine.  Unfortunately continues to smoke.  He plans to get teeth extraction in a significant manner like oral surgery and the referral is here for preop assessment.  Past Medical History:  Diagnosis Date   Anxiety    Bipolar disorder ([place])    Bipolar disorder ([place])    Cardiac abnormality    Chest discomfort 06/18/2018   Chest tightness 10/11/2020   Cigarette smoker 06/18/2018   COPD (chronic obstructive pulmonary disease) (HCC)    Coronary artery calcification of native artery 10/09/2021   Elevated coronary artery calcium score 12/13/2020   Essential hypertension 06/18/2018   Generalized anxiety disorder 09/15/2017   Headache    Heart defect, congenital    History of COVID-19  History of CVA (cerebrovascular accident) 10/09/2021   History of repair of congenital atrial septal defect (ASD) 06/18/2018   History of TIA (transient ischemic attack)    History of tobacco use    Hypertension    Hypertension, essential, benign    Low back pain 07/14/2013   Migraine headache    Mixed dyslipidemia 08/05/2020   Mixed hyperlipidemia 10/09/2021   Neck pain    Palpitations    Pre-op evaluation 10/09/2021   Stroke ([place])     Tobacco abuse    Ventricular septal defect     Past Surgical History:  Procedure Laterality Date   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   ALPRAZolam (XANAX XR) 0.5 MG 24 hr tablet Take 1 tablet (0.5 mg total) by mouth at bedtime.   amLODipine (NORVASC) 5 MG tablet Take 1 tablet (5 mg total) by mouth daily.   aspirin EC 81 MG tablet Take 81 mg by mouth daily. Swallow whole.   carbamazepine (TEGRETOL XR) 200 MG 12 hr tablet Take 1 tablet (200 mg total) by mouth 2 (two) times daily.   cholecalciferol (VITAMIN D3) 25 MCG (1000 UNIT) tablet Take 1,000 Units by mouth daily.   Coenzyme Q10 (HM COQ-10) 200 MG capsule Take 1 capsule (200 mg total) by mouth daily.   divalproex (DEPAKOTE ER) 500 MG 24 hr tablet Take 500 mg by mouth daily.   doxepin (SINEQUAN) 25 MG capsule Take 25 mg by mouth at bedtime.   Garlic Oil 694 MG TABS Take 250 mg by mouth daily.   lisinopril (ZESTRIL) 40 MG tablet Take 0.5 tablets (20 mg total) by mouth daily.   nitroGLYCERIN (NITROSTAT) 0.4 MG SL tablet Place 0.4 mg under the tongue every 5 (five) minutes as needed for chest pain.   omega-3 acid ethyl esters (LOVAZA) 1 g capsule TAKE 2 CAPSULES BY MOUTH 2 TIMES DAILY.   Pitavastatin Calcium 1 MG TABS Take 1 tablet (1 mg total) by mouth daily.   SYMBICORT 160-4.5 MCG/ACT inhaler Inhale 2 puffs into the lungs 2 (two) times daily.     Allergies:   Crestor [rosuvastatin]   Social History   Socioeconomic History   Marital status: Divorced    Spouse name: Not on file   Number of children: 2   Years of education: Not on file   Highest education level: 11th grade  Occupational History   Not on file  Tobacco Use   Smoking status: Every Day    Packs/day: 1.00    Types: Cigarettes    Smokeless tobacco: Never  Vaping Use   Vaping Use: Never used  Substance and Sexual Activity   Alcohol use: No   Drug use: No   Sexual activity: Not on file  Other Topics Concern   Not on file  Social History Narrative   Not on file   Social Determinants of Health   Financial Resource Strain: Not on file  Food Insecurity: Not on file  Transportation Needs: Not on file  Physical Activity: Not on file  Stress: Not on file  Social Connections: Not on file     Family History: The patient's family history includes Bipolar disorder in his maternal grandmother and maternal uncle; Schizophrenia in his father.  ROS:  Please see the history of present illness.    All other systems reviewed and are negative.  EKGs/Labs/Other Studies Reviewed:    The following studies were reviewed today: I discussed my findings with the patient at length.   Recent Labs: 06/21/2021: ALT 50; BUN 9; Creatinine, Ser 0.91; Potassium 4.2; Sodium 138  Recent Lipid Panel    Component Value Date/Time   CHOL 175 03/08/2021 1415   TRIG 222 (H) 03/08/2021 1415   HDL 33 (L) 03/08/2021 1415   CHOLHDL 5.3 (H) 03/08/2021 1415   LDLCALC 104 (H) 03/08/2021 1415    Physical Exam:    VS:  BP 116/60   Pulse 62   Ht 6' (1.829 m)   Wt 240 lb (108.9 kg)   SpO2 98%   BMI 32.55 kg/m     Wt Readings from Last 3 Encounters:  10/20/21 240 lb (108.9 kg)  10/04/21 239 lb (108.4 kg)  06/21/21 235 lb 12.8 oz (107 kg)     GEN: Patient is in no acute distress HEENT: Normal NECK: No JVD; No carotid bruits LYMPHATICS: No lymphadenopathy CARDIAC: Hear sounds regular, 2/6 systolic murmur at the apex. RESPIRATORY:  Clear to auscultation without rales, wheezing or rhonchi  ABDOMEN: Soft, non-tender, non-distended MUSCULOSKELETAL:  No edema; No deformity  SKIN: Warm and dry NEUROLOGIC:  Alert and oriented x 3 PSYCHIATRIC:  Normal affect   Signed, Jenean Lindau, MD  10/20/2021 9:22 AM    [facility]

## 2021-10-26 DIAGNOSIS — E785 Hyperlipidemia, unspecified: Secondary | ICD-10-CM | POA: Diagnosis not present

## 2021-10-26 DIAGNOSIS — J449 Chronic obstructive pulmonary disease, unspecified: Secondary | ICD-10-CM | POA: Diagnosis not present

## 2021-10-26 DIAGNOSIS — I10 Essential (primary) hypertension: Secondary | ICD-10-CM | POA: Diagnosis not present

## 2021-10-27 ENCOUNTER — Encounter: Payer: Self-pay | Admitting: Internal Medicine

## 2021-10-27 DIAGNOSIS — Z01810 Encounter for preprocedural cardiovascular examination: Secondary | ICD-10-CM | POA: Diagnosis not present

## 2021-11-03 NOTE — Telephone Encounter (Signed)
Was calling in to see if patient is cleared to get his tooth pull. Please advise

## 2021-11-04 NOTE — Telephone Encounter (Signed)
    Patient Name: Roy Koch  DOB: 15-Jul-1975 MRN: [id number]  Primary Cardiologist: None  Chart reviewed as part of pre-operative protocol coverage.   Clearance was sent on 09/19/2021.   I have re-routed this recommendation to the requesting party via Epic fax function. I will remove from pre-op pool.  Please call with questions.  Lenna Sciara, NP 11/04/2021, 3:31 PM

## 2021-11-07 ENCOUNTER — Telehealth: Payer: Self-pay | Admitting: Cardiology

## 2021-11-07 NOTE — Telephone Encounter (Signed)
Patient's wife calling for stress test results. She is also calling to follow up on clearance.

## 2021-11-08 NOTE — Telephone Encounter (Signed)
Please make an addendum for precert. Stress echo has been printed.  Preop assessment: For this I would like him to undergo an exercise stress echo because of his symptoms and elevated calcium score and is agreeable.  If this test is negative then he is fine from a cardiovascular aspect for the surgery.  He will still need clearance from his primary care and neurology for his strokelike symptoms at that evaluation.

## 2021-11-08 NOTE — Telephone Encounter (Signed)
Note sent to oral surgeon via [place].

## 2021-11-10 ENCOUNTER — Telehealth: Payer: Self-pay | Admitting: Cardiology

## 2021-11-23 DIAGNOSIS — N4830 Priapism, unspecified: Secondary | ICD-10-CM | POA: Diagnosis not present

## 2021-11-23 DIAGNOSIS — M545 Low back pain, unspecified: Secondary | ICD-10-CM | POA: Diagnosis not present

## 2021-11-23 DIAGNOSIS — M5416 Radiculopathy, lumbar region: Secondary | ICD-10-CM | POA: Diagnosis not present

## 2021-12-05 DIAGNOSIS — M5116 Intervertebral disc disorders with radiculopathy, lumbar region: Secondary | ICD-10-CM | POA: Diagnosis not present

## 2021-12-05 DIAGNOSIS — M4726 Other spondylosis with radiculopathy, lumbar region: Secondary | ICD-10-CM | POA: Diagnosis not present

## 2021-12-05 DIAGNOSIS — M5126 Other intervertebral disc displacement, lumbar region: Secondary | ICD-10-CM | POA: Diagnosis not present

## 2021-12-05 DIAGNOSIS — M47816 Spondylosis without myelopathy or radiculopathy, lumbar region: Secondary | ICD-10-CM | POA: Diagnosis not present

## 2021-12-06 DIAGNOSIS — Z711 Person with feared health complaint in whom no diagnosis is made: Secondary | ICD-10-CM | POA: Diagnosis not present

## 2021-12-06 DIAGNOSIS — G4733 Obstructive sleep apnea (adult) (pediatric): Secondary | ICD-10-CM | POA: Diagnosis not present

## 2021-12-20 ENCOUNTER — Ambulatory Visit: Payer: Medicare HMO | Admitting: Cardiology

## 2021-12-22 DIAGNOSIS — G44309 Post-traumatic headache, unspecified, not intractable: Secondary | ICD-10-CM | POA: Diagnosis not present

## 2021-12-22 DIAGNOSIS — S069XAS Unspecified intracranial injury with loss of consciousness status unknown, sequela: Secondary | ICD-10-CM | POA: Diagnosis not present

## 2022-01-06 DIAGNOSIS — R1030 Lower abdominal pain, unspecified: Secondary | ICD-10-CM | POA: Diagnosis not present

## 2022-01-10 ENCOUNTER — Telehealth (HOSPITAL_BASED_OUTPATIENT_CLINIC_OR_DEPARTMENT_OTHER): Payer: Medicare HMO | Admitting: Psychiatry

## 2022-01-10 DIAGNOSIS — R454 Irritability and anger: Secondary | ICD-10-CM | POA: Diagnosis not present

## 2022-01-10 DIAGNOSIS — F01518 Vascular dementia, unspecified severity, with other behavioral disturbance: Secondary | ICD-10-CM | POA: Diagnosis not present

## 2022-01-10 MED ORDER — ALPRAZOLAM ER 0.5 MG PO TB24
0.5000 mg | ORAL_TABLET | Freq: Every day | ORAL | 4 refills | Status: DC
Start: 2022-01-10 — End: 2022-01-10

## 2022-01-10 MED ORDER — CARBAMAZEPINE ER 200 MG PO TB12
200.0000 mg | ORAL_TABLET | Freq: Two times a day (BID) | ORAL | 4 refills | Status: DC
Start: 2022-01-10 — End: 2022-05-16

## 2022-01-10 MED ORDER — ALPRAZOLAM ER 0.5 MG PO TB24: 0.5000 mg | ORAL_TABLET | Freq: Every day | ORAL | 4 refills | Status: DC
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
## 2022-01-11 ENCOUNTER — Telehealth: Payer: Self-pay | Admitting: Cardiology

## 2022-01-11 DIAGNOSIS — J449 Chronic obstructive pulmonary disease, unspecified: Secondary | ICD-10-CM | POA: Diagnosis not present

## 2022-01-11 DIAGNOSIS — G8911 Acute pain due to trauma: Secondary | ICD-10-CM | POA: Diagnosis not present

## 2022-01-11 DIAGNOSIS — I10 Essential (primary) hypertension: Secondary | ICD-10-CM | POA: Diagnosis not present

## 2022-01-11 DIAGNOSIS — Z043 Encounter for examination and observation following other accident: Secondary | ICD-10-CM | POA: Diagnosis not present

## 2022-01-11 DIAGNOSIS — F17210 Nicotine dependence, cigarettes, uncomplicated: Secondary | ICD-10-CM | POA: Diagnosis not present

## 2022-01-11 DIAGNOSIS — Y92410 Unspecified street and highway as the place of occurrence of the external cause: Secondary | ICD-10-CM | POA: Diagnosis not present

## 2022-01-11 DIAGNOSIS — Z79899 Other long term (current) drug therapy: Secondary | ICD-10-CM | POA: Diagnosis not present

## 2022-01-11 DIAGNOSIS — Z7982 Long term (current) use of aspirin: Secondary | ICD-10-CM | POA: Diagnosis not present

## 2022-01-11 DIAGNOSIS — Z8673 Personal history of transient ischemic attack (TIA), and cerebral infarction without residual deficits: Secondary | ICD-10-CM | POA: Diagnosis not present

## 2022-01-11 DIAGNOSIS — Z888 Allergy status to other drugs, medicaments and biological substances status: Secondary | ICD-10-CM | POA: Diagnosis not present

## 2022-01-11 DIAGNOSIS — R079 Chest pain, unspecified: Secondary | ICD-10-CM | POA: Diagnosis not present

## 2022-01-11 DIAGNOSIS — I252 Old myocardial infarction: Secondary | ICD-10-CM | POA: Diagnosis not present

## 2022-01-11 DIAGNOSIS — R072 Precordial pain: Secondary | ICD-10-CM | POA: Diagnosis not present
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
## 2022-01-17 DIAGNOSIS — R1030 Lower abdominal pain, unspecified: Secondary | ICD-10-CM | POA: Diagnosis not present

## 2022-01-17 DIAGNOSIS — Z1211 Encounter for screening for malignant neoplasm of colon: Secondary | ICD-10-CM | POA: Diagnosis not present

## 2022-02-13 DIAGNOSIS — G44309 Post-traumatic headache, unspecified, not intractable: Secondary | ICD-10-CM | POA: Diagnosis not present

## 2022-02-25 DIAGNOSIS — E782 Mixed hyperlipidemia: Secondary | ICD-10-CM | POA: Diagnosis not present

## 2022-02-25 DIAGNOSIS — J449 Chronic obstructive pulmonary disease, unspecified: Secondary | ICD-10-CM | POA: Diagnosis not present

## 2022-02-25 DIAGNOSIS — I10 Essential (primary) hypertension: Secondary | ICD-10-CM | POA: Diagnosis not present

## 2022-03-06 DIAGNOSIS — R300 Dysuria: Secondary | ICD-10-CM | POA: Diagnosis not present

## 2022-03-06 DIAGNOSIS — R1030 Lower abdominal pain, unspecified: Secondary | ICD-10-CM | POA: Diagnosis not present

## 2022-03-06 DIAGNOSIS — N4830 Priapism, unspecified: Secondary | ICD-10-CM | POA: Diagnosis not present

## 2022-03-06 DIAGNOSIS — R102 Pelvic and perineal pain: Secondary | ICD-10-CM | POA: Diagnosis not present

## 2022-03-14 DIAGNOSIS — R1032 Left lower quadrant pain: Secondary | ICD-10-CM | POA: Diagnosis not present

## 2022-03-14 DIAGNOSIS — N401 Enlarged prostate with lower urinary tract symptoms: Secondary | ICD-10-CM | POA: Diagnosis not present

## 2022-03-14 DIAGNOSIS — R1031 Right lower quadrant pain: Secondary | ICD-10-CM | POA: Diagnosis not present

## 2022-03-14 DIAGNOSIS — N529 Male erectile dysfunction, unspecified: Secondary | ICD-10-CM | POA: Diagnosis not present

## 2022-03-14 DIAGNOSIS — F17200 Nicotine dependence, unspecified, uncomplicated: Secondary | ICD-10-CM | POA: Diagnosis not present

## 2022-03-14 DIAGNOSIS — R102 Pelvic and perineal pain: Secondary | ICD-10-CM | POA: Diagnosis not present

## 2022-03-14 DIAGNOSIS — Z79899 Other long term (current) drug therapy: Secondary | ICD-10-CM | POA: Diagnosis not present

## 2022-03-14 DIAGNOSIS — Z125 Encounter for screening for malignant neoplasm of prostate: Secondary | ICD-10-CM | POA: Diagnosis not present

## 2022-03-28 DIAGNOSIS — R1032 Left lower quadrant pain: Secondary | ICD-10-CM | POA: Diagnosis not present

## 2022-03-28 DIAGNOSIS — R102 Pelvic and perineal pain: Secondary | ICD-10-CM | POA: Diagnosis not present

## 2022-03-28 DIAGNOSIS — R109 Unspecified abdominal pain: Secondary | ICD-10-CM | POA: Diagnosis not present

## 2022-03-28 DIAGNOSIS — R1031 Right lower quadrant pain: Secondary | ICD-10-CM | POA: Diagnosis not present

## 2022-04-05 ENCOUNTER — Telehealth (HOSPITAL_COMMUNITY): Payer: Self-pay

## 2022-04-05 NOTE — Telephone Encounter (Signed)
Medication refill - Fax from pt's [facility] requesting a new Doxepin order, last provided 05/24/21 and pt returns next on 05/16/22.

## 2022-04-25 DIAGNOSIS — K760 Fatty (change of) liver, not elsewhere classified: Secondary | ICD-10-CM | POA: Diagnosis not present

## 2022-04-25 DIAGNOSIS — R109 Unspecified abdominal pain: Secondary | ICD-10-CM | POA: Diagnosis not present

## 2022-04-25 DIAGNOSIS — K828 Other specified diseases of gallbladder: Secondary | ICD-10-CM | POA: Diagnosis not present

## 2022-04-25 DIAGNOSIS — R932 Abnormal findings on diagnostic imaging of liver and biliary tract: Secondary | ICD-10-CM | POA: Diagnosis not present

## 2022-04-25 DIAGNOSIS — K8689 Other specified diseases of pancreas: Secondary | ICD-10-CM | POA: Diagnosis not present

## 2022-04-26 DIAGNOSIS — R059 Cough, unspecified: Secondary | ICD-10-CM | POA: Diagnosis not present

## 2022-04-26 DIAGNOSIS — B9689 Other specified bacterial agents as the cause of diseases classified elsewhere: Secondary | ICD-10-CM | POA: Diagnosis not present

## 2022-04-26 DIAGNOSIS — J069 Acute upper respiratory infection, unspecified: Secondary | ICD-10-CM | POA: Diagnosis not present

## 2022-04-26 DIAGNOSIS — R051 Acute cough: Secondary | ICD-10-CM | POA: Diagnosis not present

## 2022-05-15 DIAGNOSIS — G44309 Post-traumatic headache, unspecified, not intractable: Secondary | ICD-10-CM | POA: Diagnosis not present

## 2022-05-15 DIAGNOSIS — Z87820 Personal history of traumatic brain injury: Secondary | ICD-10-CM | POA: Diagnosis not present

## 2022-05-16 ENCOUNTER — Ambulatory Visit (HOSPITAL_BASED_OUTPATIENT_CLINIC_OR_DEPARTMENT_OTHER): Payer: Medicare HMO | Admitting: Psychiatry

## 2022-05-16 DIAGNOSIS — F0150 Vascular dementia without behavioral disturbance: Secondary | ICD-10-CM | POA: Diagnosis not present

## 2022-05-16 DIAGNOSIS — F01518 Vascular dementia, unspecified severity, with other behavioral disturbance: Secondary | ICD-10-CM

## 2022-05-16 MED ORDER — ALPRAZOLAM ER 0.5 MG PO TB24: 0.5000 mg | ORAL_TABLET | Freq: Every day | ORAL | 4 refills | Status: DC

## 2022-05-16 MED ORDER — CARBAMAZEPINE ER 200 MG PO TB12: 200.0000 mg | ORAL_TABLET | Freq: Two times a day (BID) | ORAL | 4 refills | Status: DC

## 2022-05-16 NOTE — Progress Notes (Signed)
BH MD/PA/NP OP Progress Note  05/16/2022 3:27 PM Roy Koch  MRN:  [id number]    Visit Diagnosis: Mild neurocognitive disorder    Today the patient was seen in the office in person.  He came with his wife.  Generally he is pretty stable.  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  Today the patient talked quite a bit about his religious beliefs.  He does not go to church but he believes in [place].  He has been reading the Hebrew Bible.  He listens to [employer].  General he is sleeping and eating well.  He shares that he has had multiple strokes and he has few clear focal deficits.  He still rides his motorcycle and his wife rides with him.  Patient is sleeping and eating well.  He uses no alcohol or any drugs.  The Tegretol has been helpful for him by preventing him from having explosions.  He says his irritability has been reasonably well-controlled and his wife agrees.  He takes a very small dose of Xanax at night which helps him sleep.  He is eating well has good energy and shows no evidence of psychosis.  He recently had a comprehensive metabolic panel that was within normal limits.  He has been having some medical problems that are undefined.  He is having lower abdominal cramps and has multiple workup including CAT scans and MRIs which all have been unhelpful.  Emotionally I think this patient is stable.  I believe he is at his baseline. Past Medical History:  Past Medical History:  Diagnosis Date   Anxiety    Bipolar disorder ([place])    Bipolar disorder ([place])    Cardiac abnormality    Chest discomfort 06/18/2018   Chest tightness 10/11/2020   Cigarette smoker 06/18/2018   COPD (chronic obstructive pulmonary disease) (HCC)    Coronary artery calcification of native artery 10/09/2021   Elevated coronary artery calcium score 12/13/2020   Essential hypertension 06/18/2018   Generalized anxiety disorder 09/15/2017   Headache    Heart defect, congenital    History of COVID-19     History of CVA (cerebrovascular accident) 10/09/2021   History of repair of congenital atrial septal defect (ASD) 06/18/2018   History of TIA (transient ischemic attack)    History of tobacco use    Hypertension    Hypertension, essential, benign    Low back pain 07/14/2013   Migraine headache    Mixed dyslipidemia 08/05/2020   Mixed hyperlipidemia 10/09/2021   Neck pain    Palpitations    Pre-op evaluation 10/09/2021   Stroke ([place])    Tobacco abuse    Ventricular septal defect     Past Surgical History:  Procedure Laterality Date   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 Reviewed, with no updates at this time.   Family History:  Family History  Problem Relation Age of Onset   Schizophrenia Father    Bipolar disorder Maternal Uncle    Bipolar disorder Maternal Grandmother     Social History:  Social History   Socioeconomic History   Marital status: Divorced    Spouse name: Not on file   Number of children: 2   Years of education: Not on file   Highest education level: 11th grade  Occupational History   Not on file  Tobacco Use   Smoking status:  Every Day    Packs/day: 1.00    Types: Cigarettes   Smokeless tobacco: Never  Vaping Use   Vaping Use: Never used  Substance and Sexual Activity   Alcohol use: No   Drug use: No   Sexual activity: Not on file  Other Topics Concern   Not on file  Social History Narrative   Not on file   Social Determinants of Health   Financial Resource Strain: Medium Risk (04/18/2018)   Overall Financial Resource Strain (CARDIA)    Difficulty of Paying Living Expenses: Somewhat hard  Food Insecurity: No Food Insecurity (04/18/2018)   Hunger Vital Sign    Worried About Running Out of Food in the Last Year: Never true    Ran Out of Food in the Last Year: Never true  Transportation Needs: Unknown (04/18/2018)   PRAPARE - Armed forces logistics/support/administrative officer (Medical): No    Lack of Transportation (Non-Medical): Not on file  Physical Activity: Insufficiently Active (04/18/2018)   Exercise Vital Sign    Days of Exercise per Week: 7 days    Minutes of Exercise per Session: 20 min  Stress: Stress Concern Present (04/18/2018)   [place]    Feeling of Stress : To some extent  Social Connections: Moderately Isolated (04/18/2018)   Social Connection and Isolation Panel [NHANES]    Frequency of Communication with Friends and Family: More than three times a week    Frequency of Social Gatherings with Friends and Family: More than three times a week    Attends Religious Services: Never    Marine scientist or Organizations: No    Attends Archivist Meetings: Never    Marital Status: Divorced    Allergies:  Allergies  Allergen Reactions   Crestor [Rosuvastatin]     Body cramps    Metabolic Disorder Labs: No results found for: "HGBA1C", "MPG" No results found for: "PROLACTIN" Lab Results  Component Value Date   CHOL 175 03/08/2021   TRIG 222 (H) 03/08/2021   HDL 33 (L) 03/08/2021   CHOLHDL 5.3 (H) 03/08/2021   LDLCALC 104 (H) 03/08/2021   LDLCALC 111 (H) 07/16/2020   No results found for: "TSH"  Therapeutic Level Labs: No results found for: "LITHIUM" No results found for: "VALPROATE" No results found for: "CBMZ"  Current Medications: Current Outpatient Medications  Medication Sig Dispense Refill   albuterol (PROVENTIL HFA;VENTOLIN HFA) 108 (90 Base) MCG/ACT inhaler Inhale 1-2 puffs into the lungs daily as needed for wheezing or shortness of breath.     ALPRAZolam (XANAX XR) 0.5 MG 24 hr tablet Take 1 tablet (0.5 mg total) by mouth at bedtime. 30 tablet 4   amLODipine (NORVASC) 5 MG tablet Take 1 tablet (5 mg total) by mouth daily. 90 tablet 3   aspirin EC 81 MG tablet Take 81 mg by mouth daily. Swallow whole.     carbamazepine  (TEGRETOL XR) 200 MG 12 hr tablet Take 1 tablet (200 mg total) by mouth 2 (two) times daily. 60 tablet 4   cholecalciferol (VITAMIN D3) 25 MCG (1000 UNIT) tablet Take 1,000 Units by mouth daily.     Coenzyme Q10 (HM COQ-10) 200 MG capsule Take 1 capsule (200 mg total) by mouth daily. 90 capsule 3   divalproex (DEPAKOTE ER) 500 MG 24 hr tablet Take 500 mg by mouth daily.     doxepin (SINEQUAN) 25 MG capsule Take 25 mg by mouth at  bedtime.     Garlic Oil 932 MG TABS Take 250 mg by mouth daily.     lisinopril (ZESTRIL) 40 MG tablet Take 0.5 tablets (20 mg total) by mouth daily. 45 tablet 3   nitroGLYCERIN (NITROSTAT) 0.4 MG SL tablet Place 0.4 mg under the tongue every 5 (five) minutes as needed for chest pain.     omega-3 acid ethyl esters (LOVAZA) 1 g capsule TAKE 2 CAPSULES BY MOUTH 2 TIMES DAILY. 360 capsule 4   Pitavastatin Calcium 1 MG TABS Take 1 tablet (1 mg total) by mouth daily. 90 tablet 3   SYMBICORT 160-4.5 MCG/ACT inhaler Inhale 2 puffs into the lungs 2 (two) times daily.     No current facility-administered medications for this visit.    Musculoskeletal: Strength & Muscle Tone: within normal limits Gait & Station: normal Patient leans: N/A  Psychiatric Specialty Exam: ROS BH MD/PA/NP OP Progress Note  05/16/2022 3:27 PM Roy Koch  MRN:  [id number]  Chief Complaint: Irritability  HPI:   Visit Diagnosis: Mild neurocognitive disorder  This patient's first problem is that of vascular dementia with behavioral disturbance.  I believe he has mood instability and I do believe it is helping Tegretol 100 mg twice daily.  He is going to go ahead and get a Tegretol level and a comprehensive metabolic panel.  His second problem is insomnia.  The patient takes 25 mg of Xanax and sleeps well.  He will return to see me in 4 months.  I do believe he is stable at this time.  He is functioning reasonably well. Past Psychiatric History: See intake H&P for full details. Reviewed, with  no updates at this time.   Past Medical History:  Past Medical History:  Diagnosis Date   Anxiety    Bipolar disorder ([place])    Bipolar disorder ([place])    Cardiac abnormality    Chest discomfort 06/18/2018   Chest tightness 10/11/2020   Cigarette smoker 06/18/2018   COPD (chronic obstructive pulmonary disease) (HCC)    Coronary artery calcification of native artery 10/09/2021   Elevated coronary artery calcium score 12/13/2020   Essential hypertension 06/18/2018   Generalized anxiety disorder 09/15/2017   Headache    Heart defect, congenital    History of COVID-19    History of CVA (cerebrovascular accident) 10/09/2021   History of repair of congenital atrial septal defect (ASD) 06/18/2018   History of TIA (transient ischemic attack)    History of tobacco use    Hypertension    Hypertension, essential, benign    Low back pain 07/14/2013   Migraine headache    Mixed dyslipidemia 08/05/2020   Mixed hyperlipidemia 10/09/2021   Neck pain    Palpitations    Pre-op evaluation 10/09/2021   Stroke ([place])    Tobacco abuse    Ventricular septal defect     Past Surgical History:  Procedure Laterality Date   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 Reviewed, with no updates at this time.   Family History:  Family History  Problem Relation Age of Onset   Schizophrenia Father    Bipolar disorder Maternal Uncle    Bipolar disorder Maternal Grandmother     Social History:  Social History   Socioeconomic History   Marital status: Divorced    Spouse name: Not on file   Number of children: 2   Years  of education: Not on file   Highest education level: 11th grade  Occupational History   Not on file  Tobacco Use   Smoking status: Every Day    Packs/day: 1.00    Types: Cigarettes   Smokeless tobacco: Never  Vaping Use   Vaping Use: Never used  Substance and Sexual Activity   Alcohol use: No    Drug use: No   Sexual activity: Not on file  Other Topics Concern   Not on file  Social History Narrative   Not on file   Social Determinants of Health   Financial Resource Strain: Medium Risk (04/18/2018)   Overall Financial Resource Strain (CARDIA)    Difficulty of Paying Living Expenses: Somewhat hard  Food Insecurity: No Food Insecurity (04/18/2018)   Hunger Vital Sign    Worried About Running Out of Food in the Last Year: Never true    Ran Out of Food in the Last Year: Never true  Transportation Needs: Unknown (04/18/2018)   PRAPARE - Hydrologist (Medical): No    Lack of Transportation (Non-Medical): Not on file  Physical Activity: Insufficiently Active (04/18/2018)   Exercise Vital Sign    Days of Exercise per Week: 7 days    Minutes of Exercise per Session: 20 min  Stress: Stress Concern Present (04/18/2018)   [place]    Feeling of Stress : To some extent  Social Connections: Moderately Isolated (04/18/2018)   Social Connection and Isolation Panel [NHANES]    Frequency of Communication with Friends and Family: More than three times a week    Frequency of Social Gatherings with Friends and Family: More than three times a week    Attends Religious Services: Never    Marine scientist or Organizations: No    Attends Archivist Meetings: Never    Marital Status: Divorced    Allergies:  Allergies  Allergen Reactions   Crestor [Rosuvastatin]     Body cramps    Metabolic Disorder Labs: No results found for: "HGBA1C", "MPG" No results found for: "PROLACTIN" Lab Results  Component Value Date   CHOL 175 03/08/2021   TRIG 222 (H) 03/08/2021   HDL 33 (L) 03/08/2021   CHOLHDL 5.3 (H) 03/08/2021   LDLCALC 104 (H) 03/08/2021   LDLCALC 111 (H) 07/16/2020   No results found for: "TSH"  Therapeutic Level Labs: No results found for: "LITHIUM" No results  found for: "VALPROATE" No results found for: "CBMZ"  Current Medications: Current Outpatient Medications  Medication Sig Dispense Refill   albuterol (PROVENTIL HFA;VENTOLIN HFA) 108 (90 Base) MCG/ACT inhaler Inhale 1-2 puffs into the lungs daily as needed for wheezing or shortness of breath.     ALPRAZolam (XANAX XR) 0.5 MG 24 hr tablet Take 1 tablet (0.5 mg total) by mouth at bedtime. 30 tablet 4   amLODipine (NORVASC) 5 MG tablet Take 1 tablet (5 mg total) by mouth daily. 90 tablet 3   aspirin EC 81 MG tablet Take 81 mg by mouth daily. Swallow whole.     carbamazepine (TEGRETOL XR) 200 MG 12 hr tablet Take 1 tablet (200 mg total) by mouth 2 (two) times daily. 60 tablet 4   cholecalciferol (VITAMIN D3) 25 MCG (1000 UNIT) tablet Take 1,000 Units by mouth daily.     Coenzyme Q10 (HM COQ-10) 200 MG capsule Take 1 capsule (200 mg total) by mouth daily. 90 capsule 3   divalproex (DEPAKOTE  ER) 500 MG 24 hr tablet Take 500 mg by mouth daily.     doxepin (SINEQUAN) 25 MG capsule Take 25 mg by mouth at bedtime.     Garlic Oil 660 MG TABS Take 250 mg by mouth daily.     lisinopril (ZESTRIL) 40 MG tablet Take 0.5 tablets (20 mg total) by mouth daily. 45 tablet 3   nitroGLYCERIN (NITROSTAT) 0.4 MG SL tablet Place 0.4 mg under the tongue every 5 (five) minutes as needed for chest pain.     omega-3 acid ethyl esters (LOVAZA) 1 g capsule TAKE 2 CAPSULES BY MOUTH 2 TIMES DAILY. 360 capsule 4   Pitavastatin Calcium 1 MG TABS Take 1 tablet (1 mg total) by mouth daily. 90 tablet 3   SYMBICORT 160-4.5 MCG/ACT inhaler Inhale 2 puffs into the lungs 2 (two) times daily.     No current facility-administered medications for this visit.    Musculoskeletal: Strength & Muscle Tone: within normal limits Gait & Station: normal Patient leans: N/A  Psychiatric Specialty Exam: ROS  There were no vitals taken for this visit.There is no height or weight on file to calculate BMI.  General Appearance: Casual and  Disheveled  Eye Contact:  Fair  Speech:  Clear and Coherent and Normal Rate  Volume:  Normal  Mood:   Less irritable  Affect:  Appropriate and Congruent  Thought Process:  Goal Directed and Descriptions of Associations: Intact  Orientation:  Full (Time, Place, and Person)  Thought Content: Logical and Focused on Xanax    Suicidal Thoughts:  No  Homicidal Thoughts:  No  Memory:  Immediate;   Poor  Judgement:  Fair  Insight:  Present, Shallow and Improving  Psychomotor Activity:  Normal  Concentration:  Concentration: Fair  Recall:  [employer] of Knowledge: Fair  Language: Fair  Akathisia:  Negative  Handed:  Right  AIMS (if indicated): not done  Assets:  Communication Skills Desire for Improvement  ADL's:  Intact  Cognition: WNL  Sleep:  Fair   Screenings: PHQ2-9    [place] Patient Outreach Telephone from 03/12/2017 in [place]  PHQ-2 Total Score 2  PHQ-9 Total Score 4        Assessment and Plan    This patient's diagnosis is mild neurocognitive disorder.  This is related to vascular dementia.  At this time we will continue taking Tegretol twice a day and a low-dose of Xanax at night for sleep.  He has been very stable and he will return to see me in 5 months. Labs Ordered: No orders of the defined types were placed in this encounter.   Labs Reviewed: na  Collateral Obtained/Records Reviewed: Wife is present and able to corroborate episodic agitation and explosive behaviors when he is angry  Plan:    Equetro 100 mg twice daily.  The patient return to see [name] in 2-1/2 months and shared how often he is been explosive.  At that time we will get blood work including a comprehensive metabolic panel.  Jerral Ralph, MD 05/16/2022, 3:27 PM  There were no vitals taken for this visit.There is no height or weight on file to calculate BMI.  General Appearance: Casual and Disheveled  Eye Contact:  Fair  Speech:  Clear and Coherent and Normal  Rate  Volume:  Normal  Mood:   Less irritable  Affect:  Appropriate and Congruent  Thought Process:  Goal Directed and Descriptions of Associations: Intact  Orientation:  Full (Time, Place,  and Person)  Thought Content: Logical and Focused on Xanax    Suicidal Thoughts:  No  Homicidal Thoughts:  No  Memory:  Immediate;   Poor  Judgement:  Fair  Insight:  Present, Shallow and Improving  Psychomotor Activity:  Normal  Concentration:  Concentration: Fair  Recall:  [employer] of Knowledge: Fair  Language: Fair  Akathisia:  Negative  Handed:  Right  AIMS (if indicated): not done  Assets:  Communication Skills Desire for Improvement  ADL's:  Intact  Cognition: WNL  Sleep:  Fair   Screenings: PHQ2-9    [place] Patient Outreach Telephone from 03/12/2017 in [place]  PHQ-2 Total Score 2  PHQ-9 Total Score 4        Assessment and Plan    This assessment and plan was written on 01/10/2022.  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  He has had irritability that is fairly well-controlled with Tegretol twice daily.  He will continue taking it and he takes Xanax a small dose point 5 at night which helps him sleep.  Overall this patient seems to be stable.  He will see me in person in 4 months. By phone spent 30 minutes patient at home I called fro home Status of current problems:  new to [employer] Ordered: No orders of the defined types were placed in this encounter.   Labs Reviewed: na  Collateral Obtained/Records Reviewed: Wife is present and able to corroborate episodic agitation and explosive behaviors when he is angry  Plan:    Equetro 100 mg twice daily.  The patient return to see [name] in 2-1/2 months and shared how often he is been explosive.  At that time we will get blood work including a comprehensive metabolic panel.  Jerral Ralph, MD 05/16/2022, 3:27 PM

## 2022-05-24 DIAGNOSIS — D123 Benign neoplasm of transverse colon: Secondary | ICD-10-CM | POA: Diagnosis not present

## 2022-05-24 DIAGNOSIS — K621 Rectal polyp: Secondary | ICD-10-CM | POA: Diagnosis not present

## 2022-05-24 DIAGNOSIS — K640 First degree hemorrhoids: Secondary | ICD-10-CM | POA: Diagnosis not present

## 2022-05-24 DIAGNOSIS — K5730 Diverticulosis of large intestine without perforation or abscess without bleeding: Secondary | ICD-10-CM | POA: Diagnosis not present

## 2022-05-24 DIAGNOSIS — K635 Polyp of colon: Secondary | ICD-10-CM | POA: Diagnosis not present

## 2022-05-24 DIAGNOSIS — Z1211 Encounter for screening for malignant neoplasm of colon: Secondary | ICD-10-CM | POA: Diagnosis not present

## 2022-05-24 DIAGNOSIS — D128 Benign neoplasm of rectum: Secondary | ICD-10-CM | POA: Diagnosis not present

## 2022-06-05 ENCOUNTER — Other Ambulatory Visit: Payer: Self-pay

## 2022-06-09 ENCOUNTER — Ambulatory Visit: Payer: Medicare HMO | Attending: Cardiology | Admitting: Cardiology

## 2022-06-09 ENCOUNTER — Encounter: Payer: Self-pay | Admitting: Cardiology

## 2022-06-09 VITALS — BP 126/82 | HR 106 | Ht 72.0 in | Wt 237.8 lb

## 2022-06-09 DIAGNOSIS — F17210 Nicotine dependence, cigarettes, uncomplicated: Secondary | ICD-10-CM | POA: Diagnosis not present

## 2022-06-09 DIAGNOSIS — Z8774 Personal history of (corrected) congenital malformations of heart and circulatory system: Secondary | ICD-10-CM

## 2022-06-09 DIAGNOSIS — E782 Mixed hyperlipidemia: Secondary | ICD-10-CM

## 2022-06-09 DIAGNOSIS — I10 Essential (primary) hypertension: Secondary | ICD-10-CM

## 2022-06-09 DIAGNOSIS — R931 Abnormal findings on diagnostic imaging of heart and coronary circulation: Secondary | ICD-10-CM

## 2022-06-09 NOTE — Patient Instructions (Signed)
Medication Instructions:  Your physician recommends that you continue on your current medications as directed. Please refer to the Current Medication list given to you today.  *If you need a refill on your cardiac medications before your next appointment, please call your pharmacy*   Lab Work: Your physician recommends that you return for lab work in: the next few days You need to have labs done when you are fasting.  You can come Monday through Friday 8:30 am to 12:00 pm and 1:15 to 4:30. You do not need to make an appointment as the order has already been placed. The labs you are going to have done are CMP and Lipids.  If you have labs (blood work) drawn today and your tests are completely normal, you will receive your results only by: [facility] (if you have MyChart) OR A paper copy in the mail If you have any lab test that is abnormal or we need to change your treatment, we will call you to review the results.   Testing/Procedures: Your physician has requested that you have an echocardiogram. Echocardiography is a painless test that uses sound waves to create images of your heart. It provides your doctor with information about the size and shape of your heart and how well your heart's chambers and valves are working. This procedure takes approximately one hour. There are no restrictions for this procedure. Please do NOT wear cologne, perfume, aftershave, or lotions (deodorant is allowed). Please arrive 15 minutes prior to your appointment time.     Follow-Up: At [facility], you and your health needs are our priority.  As part of our continuing mission to provide you with exceptional heart care, we have created designated Provider Care Teams.  These Care Teams include your primary Cardiologist (physician) and Advanced Practice Providers (APPs -  Physician Assistants and Nurse Practitioners) who all work together to provide you with the care you need, when you need it.  We  recommend signing up for the patient portal called "MyChart".  Sign up information is provided on this After Visit Summary.  MyChart is used to connect with patients for Virtual Visits (Telemedicine).  Patients are able to view lab/test results, encounter notes, upcoming appointments, etc.  Non-urgent messages can be sent to your provider as well.   To learn more about what you can do with MyChart, go to [email or web address].    Your next appointment:   9 month(s)  The format for your next appointment:   In Person  Provider:   Jyl Heinz, MD   Other Instructions Echocardiogram An echocardiogram is a test that uses sound waves (ultrasound) to produce images of the heart. Images from an echocardiogram can provide important information about: Heart size and shape. The size and thickness and movement of your heart's walls. Heart muscle function and strength. Heart valve function or if you have stenosis. Stenosis is when the heart valves are too narrow. If blood is flowing backward through the heart valves (regurgitation). A tumor or infectious growth around the heart valves. Areas of heart muscle that are not working well because of poor blood flow or injury from a heart attack. Aneurysm detection. An aneurysm is a weak or damaged part of an artery wall. The wall bulges out from the normal force of blood pumping through the body. Tell a health care provider about: Any allergies you have. All medicines you are taking, including vitamins, herbs, eye drops, creams, and over-the-counter medicines. Any blood disorders you have. Any surgeries you  have had. Any medical conditions you have. Whether you are pregnant or may be pregnant. What are the risks? Generally, this is a safe test. However, problems may occur, including an allergic reaction to dye (contrast) that may be used during the test. What happens before the test? No specific preparation is needed. You may eat and drink  normally. What happens during the test? You will take off your clothes from the waist up and put on a hospital gown. Electrodes or electrocardiogram (ECG)patches may be placed on your chest. The electrodes or patches are then connected to a device that monitors your heart rate and rhythm. You will lie down on a table for an ultrasound exam. A gel will be applied to your chest to help sound waves pass through your skin. A handheld device, called a transducer, will be pressed against your chest and moved over your heart. The transducer produces sound waves that travel to your heart and bounce back (or "echo" back) to the transducer. These sound waves will be captured in real-time and changed into images of your heart that can be viewed on a video monitor. The images will be recorded on a computer and reviewed by your health care provider. You may be asked to change positions or hold your breath for a short time. This makes it easier to get different views or better views of your heart. In some cases, you may receive contrast through an IV in one of your veins. This can improve the quality of the pictures from your heart. The procedure may vary among health care providers and hospitals.   What can I expect after the test? You may return to your normal, everyday life, including diet, activities, and medicines, unless your health care provider tells you not to do that. Follow these instructions at home: It is up to you to get the results of your test. Ask your health care provider, or the department that is doing the test, when your results will be ready. Keep all follow-up visits. This is important. Summary An echocardiogram is a test that uses sound waves (ultrasound) to produce images of the heart. Images from an echocardiogram can provide important information about the size and shape of your heart, heart muscle function, heart valve function, and other possible heart problems. You do not need to do  anything to prepare before this test. You may eat and drink normally. After the echocardiogram is completed, you may return to your normal, everyday life, unless your health care provider tells you not to do that. This information is not intended to replace advice given to you by your health care provider. Make sure you discuss any questions you have with your health care provider. Document Revised: 01/06/2020 Document Reviewed: 01/06/2020 Elsevier Patient Education  [place]

## 2022-06-09 NOTE — Addendum Note (Signed)
Addended by: Truddie Hidden on: 06/09/2022 10:25 AM   Modules accepted: Orders

## 2022-06-09 NOTE — Progress Notes (Signed)
Cardiology Office Note:    Date:  06/09/2022   ID:  Roy Koch, DOB [birth year], MRN [id number]  PCP:  [name], PA-C  Cardiologist:  Jenean Lindau, MD   Referring MD: [name], PA-C    ASSESSMENT:    1. History of repair of congenital atrial septal defect (ASD)   2. Hypertension, essential, benign   3. Elevated coronary artery calcium score   4. Mixed hyperlipidemia    PLAN:    In order of problems listed above:  Elevated calcium score: Secondary prevention stressed with the patient.  Importance of compliance with diet medication stressed any vocalized understanding.  He was advised to walk at least half an hour a day 5 days a week and he promises to do so. Essential hypertension: Blood pressure stable and diet was emphasized.  Lifestyle modification urged. Cigarette smoker: I spent 5 minutes with the patient discussing solely about smoking. Smoking cessation was counseled. I suggested to the patient also different medications and pharmacological interventions. Patient is keen to try stopping on its own at this time. He will get back to me if he needs any further assistance in this matter. Cardiovascular disease: Echocardiogram to assess cardiac anatomy. Mixed dyslipidemia: On lipid-lowering medications but does not want statin therapy as he tells me that he is statin intolerant.  History of at least 2 or 3 statins.  Will review his records and advise him accordingly. Patient will be seen in follow-up appointment in 6 months or earlier if the patient has any concerns    Medication Adjustments/Labs and Tests Ordered: Current medicines are reviewed at length with the patient today.  Concerns regarding medicines are outlined above.  No orders of the defined types were placed in this encounter.  No orders of the defined types were placed in this encounter.    No chief complaint on file.    History of Present Illness:    Roy Koch is a 47 y.o. male.   Patient has past medical history of congenital heart disease postrepair.  He does not know much of the details.  He tells me that he had a hole in the heart.  He has history of essential hypertension elevated calcium score and mixed dyslipidemia.  He denies any problems at this time and takes care of activities of daily living.  No chest pain orthopnea or PND.  He tells me that he does not want to take statin as he started and tolerated.  At the time of my evaluation, the patient is alert awake oriented and in no distress.  She had elevated calcium score.  Past Medical History:  Diagnosis Date   Anxiety    Bipolar disorder ([place])    Bipolar disorder ([place])    Cardiac abnormality    Chest discomfort 06/18/2018   Chest tightness 10/11/2020   Cigarette smoker 06/18/2018   COPD (chronic obstructive pulmonary disease) (HCC)    Coronary artery calcification of native artery 10/09/2021   Elevated coronary artery calcium score 12/13/2020   Essential hypertension 06/18/2018   Generalized anxiety disorder 09/15/2017   Headache    Heart defect, congenital    History of COVID-19    History of CVA (cerebrovascular accident) 10/09/2021   History of repair of congenital atrial septal defect (ASD) 06/18/2018   History of TIA (transient ischemic attack)    History of tobacco use    Hypertension    Hypertension, essential, benign    Low back pain 07/14/2013   Migraine headache  Mixed dyslipidemia 08/05/2020   Mixed hyperlipidemia 10/09/2021   Neck pain    Palpitations    Pre-op evaluation 10/09/2021   Stroke ([place])    Tobacco abuse    Ventricular septal defect     Past Surgical History:  Procedure Laterality Date   AMPUTATION FINGER / THUMB     CARDIAC SURGERY  at 47 years old    Current Medications: Current Meds  Medication Sig   albuterol (PROVENTIL HFA;VENTOLIN HFA) 108 (90 Base) MCG/ACT inhaler Inhale 1-2 puffs into the lungs daily as needed for wheezing or shortness of breath.   ALPRAZolam (XANAX  XR) 0.5 MG 24 hr tablet Take 1 tablet (0.5 mg total) by mouth at bedtime.   amLODipine (NORVASC) 5 MG tablet Take 1 tablet (5 mg total) by mouth daily.   aspirin EC 81 MG tablet Take 81 mg by mouth daily. Swallow whole.   carbamazepine (TEGRETOL XR) 100 MG 12 hr tablet Take 100 mg by mouth 2 (two) times daily.   cholecalciferol (VITAMIN D3) 25 MCG (1000 UNIT) tablet Take 1,000 Units by mouth daily.   Coenzyme Q10 (HM COQ-10) 200 MG capsule Take 1 capsule (200 mg total) by mouth daily.   divalproex (DEPAKOTE ER) 500 MG 24 hr tablet Take 500 mg by mouth daily.   doxepin (SINEQUAN) 25 MG capsule Take 25 mg by mouth at bedtime.   lisinopril (ZESTRIL) 40 MG tablet Take 0.5 tablets (20 mg total) by mouth daily.   nitroGLYCERIN (NITROSTAT) 0.4 MG SL tablet Place 0.4 mg under the tongue every 5 (five) minutes as needed for chest pain.   omega-3 acid ethyl esters (LOVAZA) 1 g capsule TAKE 2 CAPSULES BY MOUTH 2 TIMES DAILY.   SYMBICORT 160-4.5 MCG/ACT inhaler Inhale 2 puffs into the lungs 2 (two) times daily.     Allergies:   Crestor [rosuvastatin]   Social History   Socioeconomic History   Marital status: Divorced    Spouse name: Not on file   Number of children: 2   Years of education: Not on file   Highest education level: 11th grade  Occupational History   Not on file  Tobacco Use   Smoking status: Every Day    Packs/day: 1.00    Types: Cigarettes   Smokeless tobacco: Never  Vaping Use   Vaping Use: Never used  Substance and Sexual Activity   Alcohol use: No   Drug use: No   Sexual activity: Not on file  Other Topics Concern   Not on file  Social History Narrative   Not on file   Social Determinants of Health   Financial Resource Strain: Medium Risk (04/18/2018)   Overall Financial Resource Strain (CARDIA)    Difficulty of Paying Living Expenses: Somewhat hard  Food Insecurity: No Food Insecurity (04/18/2018)   Hunger Vital Sign    Worried About Running Out of Food in the  Last Year: Never true    [place] in the Last Year: Never true  Transportation Needs: Unknown (04/18/2018)   PRAPARE - Hydrologist (Medical): No    Lack of Transportation (Non-Medical): Not on file  Physical Activity: Insufficiently Active (04/18/2018)   Exercise Vital Sign    Days of Exercise per Week: 7 days    Minutes of Exercise per Session: 20 min  Stress: Stress Concern Present (04/18/2018)   [place]    Feeling of Stress : To some extent  Social Connections: Moderately Isolated (04/18/2018)   Social Connection and Isolation Panel [NHANES]    Frequency of Communication with Friends and Family: More than three times a week    Frequency of Social Gatherings with Friends and Family: More than three times a week    Attends Religious Services: Never    Marine scientist or Organizations: No    Attends Music therapist: Never    Marital Status: Divorced     Family History: The patient's family history includes Bipolar disorder in his maternal grandmother and maternal uncle; Schizophrenia in his father.  ROS:   Please see the history of present illness.    All other systems reviewed and are negative.  EKGs/Labs/Other Studies Reviewed:    The following studies were reviewed today: I discussed my findings with the patient at length.   Recent Labs: 06/21/2021: ALT 50; BUN 9; Creatinine, Ser 0.91; Potassium 4.2; Sodium 138  Recent Lipid Panel    Component Value Date/Time   CHOL 175 03/08/2021 1415   TRIG 222 (H) 03/08/2021 1415   HDL 33 (L) 03/08/2021 1415   CHOLHDL 5.3 (H) 03/08/2021 1415   LDLCALC 104 (H) 03/08/2021 1415    Physical Exam:    VS:  BP 126/82   Pulse (!) 106   Ht 6' (1.829 m)   Wt 237 lb 12.8 oz (107.9 kg)   SpO2 93%   BMI 32.25 kg/m     Wt Readings from Last 3 Encounters:  06/09/22 237 lb 12.8 oz (107.9 kg)  10/20/21 240 lb  (108.9 kg)  10/04/21 239 lb (108.4 kg)     GEN: Patient is in no acute distress HEENT: Normal NECK: No JVD; No carotid bruits LYMPHATICS: No lymphadenopathy CARDIAC: Hear sounds regular, 2/6 systolic murmur at the apex. RESPIRATORY:  Clear to auscultation without rales, wheezing or rhonchi  ABDOMEN: Soft, non-tender, non-distended MUSCULOSKELETAL:  No edema; No deformity  SKIN: Warm and dry NEUROLOGIC:  Alert and oriented x 3 PSYCHIATRIC:  Normal affect   Signed, Jenean Lindau, MD  06/09/2022 10:22 AM    [facility]

## 2022-06-13 DIAGNOSIS — E782 Mixed hyperlipidemia: Secondary | ICD-10-CM | POA: Diagnosis not present

## 2022-06-13 LAB — COMPREHENSIVE METABOLIC PANEL
ALT: 80 IU/L — ABNORMAL HIGH (ref 0–44)
AST: 41 IU/L — ABNORMAL HIGH (ref 0–40)
Albumin/Globulin Ratio: 1.9 (ref 1.2–2.2)
Albumin: 4.5 g/dL (ref 4.1–5.1)
Alkaline Phosphatase: 57 IU/L (ref 44–121)
BUN/Creatinine Ratio: 9 (ref 9–20)
BUN: 8 mg/dL (ref 6–24)
Bilirubin Total: 0.5 mg/dL (ref 0.0–1.2)
CO2: 24 mmol/L (ref 20–29)
Calcium: 9.2 mg/dL (ref 8.7–10.2)
Chloride: 99 mmol/L (ref 96–106)
Creatinine, Ser: 0.93 mg/dL (ref 0.76–1.27)
Globulin, Total: 2.4 g/dL (ref 1.5–4.5)
Glucose: 92 mg/dL (ref 70–99)
Potassium: 4.3 mmol/L (ref 3.5–5.2)
Sodium: 137 mmol/L (ref 134–144)
Total Protein: 6.9 g/dL (ref 6.0–8.5)
eGFR: 102 mL/min/{1.73_m2} (ref >59)

## 2022-06-13 LAB — LIPID PANEL
Chol/HDL Ratio: 5.3 ratio — ABNORMAL HIGH (ref 0.0–5.0)
Cholesterol, Total: 208 mg/dL — ABNORMAL HIGH (ref 100–199)
HDL: 39 mg/dL — ABNORMAL LOW (ref >39)
LDL Chol Calc (NIH): 146 mg/dL — ABNORMAL HIGH (ref 0–99)
Triglycerides: 125 mg/dL (ref 0–149)
VLDL Cholesterol Cal: 23 mg/dL (ref 5–40)

## 2022-06-14 ENCOUNTER — Telehealth: Payer: Self-pay

## 2022-06-14 DIAGNOSIS — R931 Abnormal findings on diagnostic imaging of heart and coronary circulation: Secondary | ICD-10-CM

## 2022-06-14 DIAGNOSIS — E782 Mixed hyperlipidemia: Secondary | ICD-10-CM

## 2022-06-14 NOTE — Telephone Encounter (Signed)
-----  Message from Jenean Lindau, MD sent at 06/14/2022  9:00 AM EST ----- Lipids are elevated and LFTs are elevated.  Best to refer him to lipid clinic.  Copy primary care for evaluation for elevated LFTs.  Maybe they are already aware of this. Jenean Lindau, MD 06/14/2022 9:00 AM

## 2022-06-20 ENCOUNTER — Ambulatory Visit: Payer: Medicare HMO | Attending: Cardiology

## 2022-06-20 DIAGNOSIS — Z8774 Personal history of (corrected) congenital malformations of heart and circulatory system: Secondary | ICD-10-CM | POA: Diagnosis not present

## 2022-06-20 LAB — ECHOCARDIOGRAM COMPLETE
Area-P 1/2: 3.91 cm2
Calc EF: 55 %
S' Lateral: 3.4 cm
Single Plane A2C EF: 55.5 %
Single Plane A4C EF: 55.5 %

## 2022-06-27 DIAGNOSIS — F17200 Nicotine dependence, unspecified, uncomplicated: Secondary | ICD-10-CM | POA: Diagnosis not present

## 2022-06-27 DIAGNOSIS — N401 Enlarged prostate with lower urinary tract symptoms: Secondary | ICD-10-CM | POA: Diagnosis not present

## 2022-06-27 DIAGNOSIS — R102 Pelvic and perineal pain: Secondary | ICD-10-CM | POA: Diagnosis not present

## 2022-06-27 DIAGNOSIS — N529 Male erectile dysfunction, unspecified: Secondary | ICD-10-CM | POA: Diagnosis not present

## 2022-06-27 DIAGNOSIS — R1032 Left lower quadrant pain: Secondary | ICD-10-CM | POA: Diagnosis not present

## 2022-07-12 DIAGNOSIS — J449 Chronic obstructive pulmonary disease, unspecified: Secondary | ICD-10-CM | POA: Diagnosis not present

## 2022-07-12 DIAGNOSIS — J029 Acute pharyngitis, unspecified: Secondary | ICD-10-CM | POA: Diagnosis not present

## 2022-07-12 DIAGNOSIS — F319 Bipolar disorder, unspecified: Secondary | ICD-10-CM | POA: Diagnosis not present

## 2022-07-12 DIAGNOSIS — R197 Diarrhea, unspecified: Secondary | ICD-10-CM | POA: Diagnosis not present

## 2022-07-12 DIAGNOSIS — R0981 Nasal congestion: Secondary | ICD-10-CM | POA: Diagnosis not present

## 2022-07-12 DIAGNOSIS — J441 Chronic obstructive pulmonary disease with (acute) exacerbation: Secondary | ICD-10-CM | POA: Diagnosis not present

## 2022-07-12 DIAGNOSIS — I10 Essential (primary) hypertension: Secondary | ICD-10-CM | POA: Diagnosis not present

## 2022-07-12 DIAGNOSIS — R062 Wheezing: Secondary | ICD-10-CM | POA: Diagnosis not present

## 2022-07-12 DIAGNOSIS — R6889 Other general symptoms and signs: Secondary | ICD-10-CM | POA: Diagnosis not present

## 2022-07-12 DIAGNOSIS — R0602 Shortness of breath: Secondary | ICD-10-CM | POA: Diagnosis not present

## 2022-07-12 DIAGNOSIS — Z20822 Contact with and (suspected) exposure to covid-19: Secondary | ICD-10-CM | POA: Diagnosis not present

## 2022-07-14 ENCOUNTER — Other Ambulatory Visit: Payer: Self-pay | Admitting: Cardiology

## 2022-07-16 NOTE — Progress Notes (Unsigned)
Office Visit    Patient Name: Roy Koch Date of Encounter: 07/18/2022  Primary Care Provider:  Garnetta Buddy I, NP Primary Cardiologist:  None  Chief Complaint    Hyperlipidemia   Significant Past Medical History   CAD MI at 17, Elevated calcium score, in 84th percentile for matched controls  HTN Stable on amlodipine, lisinopril  CVA First at age 47,  several TIA since then 09/2021  Congenital heart disease Details unclear, knows he had hole in his heart, was repaired  Tobacco abuse      Allergies  Allergen Reactions   Crestor [Rosuvastatin]     Body cramps    History of Present Illness    Roy Koch is a 47 y.o. male patient of Dr Geraldo Pitter, in the office today to discuss options for cholesterol management.  Patient notes that he had his first MI and stroke at the age of 68 and has had several minor events since then.  He says that Dr. Aldona Bar was his first cardiologist years ago.    Insurance Carrier: [employer] Gold [employer]  LDL Cholesterol goal:  LDL < 70  Current Medications: none  Previously tried:  atorvastatin, rosuvastatin, pitavastatin - myalgias  Family Hx: father unsure, grandfather with multiple MI, GGF gigantism (7'); mgm - has AFib, had MI recently, still living at 43; mother died COPD, broke her hip, died after surgery; daughter died at 52 -fell and hit head; son in good health    Social Hx: Tobacco: 1.5 ppd Alcohol:   none - quit years ago   Diet:  lots of deer meat, fish, occasional steak or chicken; likes vegetables, prefers fresh, but occasional canned  Exercise: dumbells, reistance, walking, hiking;   Adherence Assessment  Do you ever forget to take your medication? [x]$ Yes - misses occasional dose 2/2 strokes []$ No  Do you ever skip doses due to side effects? []$ Yes [x]$ No  Do you have trouble affording your medicines? []$ Yes [x]$ No  Are you ever unable to pick up your medication due to transportation difficulties? []$ Yes [x]$ No  Do you ever  stop taking your medications because you don't believe they are helping? []$ Yes [x]$ No   Adherence strategy: 7 day pill mider  Accessory Clinical Findings   Lab Results  Component Value Date   CHOL 208 (H) 06/13/2022   HDL 39 (L) 06/13/2022   LDLCALC 146 (H) 06/13/2022   TRIG 125 06/13/2022   CHOLHDL 5.3 (H) 06/13/2022    Lab Results  Component Value Date   ALT 80 (H) 06/13/2022   AST 41 (H) 06/13/2022   ALKPHOS 57 06/13/2022   BILITOT 0.5 06/13/2022   Lab Results  Component Value Date   CREATININE 0.93 06/13/2022   BUN 8 06/13/2022   NA 137 06/13/2022   K 4.3 06/13/2022   CL 99 06/13/2022   CO2 24 06/13/2022   No results found for: "HGBA1C"  Home Medications    Current Outpatient Medications  Medication Sig Dispense Refill   albuterol (PROVENTIL HFA;VENTOLIN HFA) 108 (90 Base) MCG/ACT inhaler Inhale 1-2 puffs into the lungs daily as needed for wheezing or shortness of breath.     ALPRAZolam (XANAX XR) 0.5 MG 24 hr tablet Take 1 tablet (0.5 mg total) by mouth at bedtime. 30 tablet 4   amLODipine (NORVASC) 5 MG tablet Take 1 tablet (5 mg total) by mouth daily. 90 tablet 3   aspirin EC 81 MG tablet Take 81 mg by mouth daily. Swallow whole.  carbamazepine (TEGRETOL XR) 100 MG 12 hr tablet Take 100 mg by mouth 2 (two) times daily.     cholecalciferol (VITAMIN D3) 25 MCG (1000 UNIT) tablet Take 1,000 Units by mouth daily.     Coenzyme Q10 (HM COQ-10) 200 MG capsule Take 1 capsule (200 mg total) by mouth daily. 90 capsule 3   divalproex (DEPAKOTE ER) 500 MG 24 hr tablet Take 500 mg by mouth daily.     doxepin (SINEQUAN) 25 MG capsule Take 25 mg by mouth at bedtime.     lisinopril (ZESTRIL) 40 MG tablet Take 0.5 tablets (20 mg total) by mouth daily. 45 tablet 2   nitroGLYCERIN (NITROSTAT) 0.4 MG SL tablet Place 0.4 mg under the tongue every 5 (five) minutes as needed for chest pain.     omega-3 acid ethyl esters (LOVAZA) 1 g capsule TAKE 2 CAPSULES BY MOUTH 2 TIMES DAILY.  360 capsule 4   SYMBICORT 160-4.5 MCG/ACT inhaler Inhale 2 puffs into the lungs 2 (two) times daily.     No current facility-administered medications for this visit.     Assessment & Plan    Mixed dyslipidemia Assessment: Patient with ASCVD not at LDL goal of < 70 Most recent LDL 146 on 05/2022 Not able to tolerate statins secondary to myalgias Reviewed options for lowering LDL cholesterol, including ezetimibe, PCSK-9 inhibitors, bempedoic acid and inclisiran.  Discussed mechanisms of action, dosing, side effects, potential decreases in LDL cholesterol and costs.  Also reviewed potential options for patient assistance.  Plan: Patient agreeable to starting Repatha Repeat labs after:  3 months Lipid Liver function Will sign patient for [employer] once PA is approved   Tommy Medal, PharmD CPP [facility] [address] [place]  [place], [place] [address] [phone]  07/18/2022, 4:56 PM

## 2022-07-17 ENCOUNTER — Ambulatory Visit
Payer: Medicare HMO | Attending: Cardiovascular Disease | Admitting: Pharmacist Clinician (PhC)/ Clinical Pharmacy Specialist

## 2022-07-17 DIAGNOSIS — E782 Mixed hyperlipidemia: Secondary | ICD-10-CM

## 2022-07-17 NOTE — Patient Instructions (Signed)
Your Results:             Your most recent labs Goal  Total Cholesterol 208 < 200  Triglycerides 125 < 150  HDL (happy/good cholesterol) 39 > 40  LDL (lousy/bad cholesterol 146 < 55   Medication changes:  We will start the process to get Repatha covered by your insurance.  Once the prior authorization is complete, I will call to let you know and confirm pharmacy information.   You will take one injection every 14 days.  Lab orders:  We want to repeat labs after 2-3 months.  We will send you a lab order to remind you once we get closer to that time.    I will have Avelino Leeds, our HealthCoach, reach out to you for assistance with smoking cessation.     Thank you for choosing [facility]

## 2022-07-18 ENCOUNTER — Encounter: Payer: Self-pay | Admitting: Pharmacist Clinician (PhC)/ Clinical Pharmacy Specialist

## 2022-07-18 NOTE — Assessment & Plan Note (Signed)
Assessment: Patient with ASCVD not at LDL goal of < 70 Most recent LDL 146 on 05/2022 Not able to tolerate statins secondary to myalgias Reviewed options for lowering LDL cholesterol, including ezetimibe, PCSK-9 inhibitors, bempedoic acid and inclisiran.  Discussed mechanisms of action, dosing, side effects, potential decreases in LDL cholesterol and costs.  Also reviewed potential options for patient assistance.  Plan: Patient agreeable to starting Repatha Repeat labs after:  3 months Lipid Liver function Will sign patient for [employer] once PA is approved

## 2022-08-03 ENCOUNTER — Telehealth: Payer: Self-pay | Admitting: Pharmacist Clinician (PhC)/ Clinical Pharmacy Specialist

## 2022-08-03 NOTE — Telephone Encounter (Signed)
Please do Repatha PA

## 2022-08-04 ENCOUNTER — Other Ambulatory Visit (HOSPITAL_COMMUNITY): Payer: Self-pay

## 2022-08-04 ENCOUNTER — Telehealth: Payer: Self-pay

## 2022-08-04 MED ORDER — REPATHA SURECLICK 140 MG/ML ~~LOC~~ SOAJ: 140.0000 mg | SUBCUTANEOUS | 0 refills | Status: DC

## 2022-08-04 MED ORDER — REPATHA SURECLICK 140 MG/ML ~~LOC~~ SOAJ: 140.0000 mg | SUBCUTANEOUS | 12 refills | Status: DC

## 2022-08-04 NOTE — Telephone Encounter (Signed)
Patient will come by office on Monday to get a sample.  Will give information for [employer] grant at same time.

## 2022-08-04 NOTE — Telephone Encounter (Signed)
Unable to leave VM for patient.  Repatha approved   [employer] approved  Pharmacy Card CARD NO. [id number]   BIN [id number]   PCN [id number]   PC GROUP [id number]   Unable to leave message for patient - will try again

## 2022-08-04 NOTE — Telephone Encounter (Signed)
Pharmacy Patient Advocate Encounter  Prior Authorization for REPATHA 140 MG/ML INJ has been approved.    PA# [id number] Effective dates: 05/29/22 through 05/29/23   Received notification from [facility] that prior authorization for REPATHA 140 MG/ML INJ is needed.    PA submitted on 08/04/22 Key [id number] Status is pending  [name], [name] Patient Advocate Specialist Direct Number: [phone] Fax: [phone]

## 2022-08-08 ENCOUNTER — Telehealth: Payer: Self-pay | Admitting: Cardiology

## 2022-08-08 MED ORDER — REPATHA SURECLICK 140 MG/ML ~~LOC~~ SOAJ: 140.0000 mg | SUBCUTANEOUS | 3 refills | Status: DC

## 2022-08-08 NOTE — Telephone Encounter (Signed)
*  STAT* If patient is at the pharmacy, call can be transferred to refill team.   1. Which medications need to be refilled? (please list name of each medication and dose if known) Evolocumab (REPATHA SURECLICK) 008 MG/ML SOAJ  2. Which pharmacy/location (including street and city if local pharmacy) is medication to be sent to? [id number] - [place], [place] [employer]  3. Do they need a 30 day or 90 day supply? 90 day supply    Patient states this medication went to the incorrect pharmacy. [facility] [place] and they did not [facility] it. He is requesting to have it sent in again.

## 2022-08-10 ENCOUNTER — Other Ambulatory Visit (HOSPITAL_COMMUNITY): Payer: Self-pay | Admitting: Psychiatry

## 2022-09-19 ENCOUNTER — Other Ambulatory Visit (HOSPITAL_COMMUNITY): Payer: Self-pay | Admitting: Psychiatry

## 2022-09-20 DIAGNOSIS — Z205 Contact with and (suspected) exposure to viral hepatitis: Secondary | ICD-10-CM | POA: Diagnosis not present

## 2022-09-20 DIAGNOSIS — T7840XA Allergy, unspecified, initial encounter: Secondary | ICD-10-CM | POA: Diagnosis not present

## 2022-10-13 ENCOUNTER — Other Ambulatory Visit (HOSPITAL_COMMUNITY): Payer: Self-pay | Admitting: Psychiatry

## 2022-10-17 ENCOUNTER — Ambulatory Visit (HOSPITAL_COMMUNITY): Payer: Medicare HMO | Admitting: Psychiatry

## 2022-11-08 ENCOUNTER — Ambulatory Visit (HOSPITAL_COMMUNITY): Payer: Medicare HMO | Admitting: Psychiatry

## 2022-11-13 ENCOUNTER — Other Ambulatory Visit (HOSPITAL_COMMUNITY): Payer: Self-pay | Admitting: Psychiatry

## 2022-11-14 DIAGNOSIS — R519 Headache, unspecified: Secondary | ICD-10-CM | POA: Diagnosis not present

## 2022-11-14 DIAGNOSIS — I6930 Unspecified sequelae of cerebral infarction: Secondary | ICD-10-CM | POA: Diagnosis not present

## 2022-11-14 DIAGNOSIS — G8929 Other chronic pain: Secondary | ICD-10-CM | POA: Diagnosis not present

## 2022-11-14 DIAGNOSIS — M79672 Pain in left foot: Secondary | ICD-10-CM | POA: Diagnosis not present

## 2022-11-14 DIAGNOSIS — L03116 Cellulitis of left lower limb: Secondary | ICD-10-CM | POA: Diagnosis not present

## 2022-12-09 ENCOUNTER — Other Ambulatory Visit (HOSPITAL_COMMUNITY): Payer: Self-pay | Admitting: Psychiatry

## 2022-12-14 DIAGNOSIS — R051 Acute cough: Secondary | ICD-10-CM | POA: Diagnosis not present

## 2022-12-14 DIAGNOSIS — R059 Cough, unspecified: Secondary | ICD-10-CM | POA: Diagnosis not present

## 2022-12-14 DIAGNOSIS — J441 Chronic obstructive pulmonary disease with (acute) exacerbation: Secondary | ICD-10-CM | POA: Diagnosis not present

## 2022-12-14 DIAGNOSIS — R7303 Prediabetes: Secondary | ICD-10-CM | POA: Diagnosis not present

## 2022-12-24 ENCOUNTER — Other Ambulatory Visit: Payer: Self-pay | Admitting: Cardiology

## 2022-12-24 ENCOUNTER — Other Ambulatory Visit (HOSPITAL_COMMUNITY): Payer: Self-pay | Admitting: Psychiatry

## 2022-12-24 DIAGNOSIS — E782 Mixed hyperlipidemia: Secondary | ICD-10-CM

## 2022-12-26 DIAGNOSIS — F17200 Nicotine dependence, unspecified, uncomplicated: Secondary | ICD-10-CM | POA: Diagnosis not present

## 2022-12-26 DIAGNOSIS — N529 Male erectile dysfunction, unspecified: Secondary | ICD-10-CM | POA: Diagnosis not present

## 2022-12-26 DIAGNOSIS — N401 Enlarged prostate with lower urinary tract symptoms: Secondary | ICD-10-CM | POA: Diagnosis not present

## 2023-01-02 ENCOUNTER — Other Ambulatory Visit: Payer: Self-pay

## 2023-01-02 ENCOUNTER — Encounter (HOSPITAL_COMMUNITY): Payer: Self-pay | Admitting: Psychiatry

## 2023-01-02 ENCOUNTER — Ambulatory Visit (HOSPITAL_BASED_OUTPATIENT_CLINIC_OR_DEPARTMENT_OTHER): Payer: Medicare HMO | Admitting: Psychiatry

## 2023-01-02 VITALS — BP 122/85 | HR 71 | Ht 72.0 in | Wt 244.0 lb

## 2023-01-02 DIAGNOSIS — F01518 Vascular dementia, unspecified severity, with other behavioral disturbance: Secondary | ICD-10-CM | POA: Diagnosis not present

## 2023-01-02 MED ORDER — ALPRAZOLAM ER 0.5 MG PO TB24: 0.5000 mg | ORAL_TABLET | Freq: Every day | ORAL | 4 refills | Status: DC

## 2023-01-02 MED ORDER — DOXEPIN HCL 25 MG PO CAPS: 25.0000 mg | ORAL_CAPSULE | Freq: Every day | ORAL | 4 refills | Status: DC

## 2023-01-02 MED ORDER — CARBAMAZEPINE ER 100 MG PO TB12: 100.0000 mg | ORAL_TABLET | Freq: Two times a day (BID) | ORAL | 6 refills | Status: DC

## 2023-01-02 NOTE — Progress Notes (Signed)
BH MD/PA/NP OP Progress Note  01/02/2023 4:02 PM Roy Koch  MRN:  [id number]    Visit Diagnosis: Mild neurocognitive disorder  Today the patient is seen with wife.  He has not been here in over 8 months.  Apparently they have scheduled issues.  They missed an appointment and that it took him a while to get back in.  Unfortunately a few months ago he ran out of all his medicines and was not represcribed.  Today we will start back on all of his medicines.  In the last few months almost predictably he started getting much more irritable.  He was not sleeping and he was off his Tegretol.  But other things happened as well.  His grandmother is quite ill and he is very close to her.  She lives 45 minutes away and is hard to get to see her that much.  Even more importantly is that his wife is with him today had a myocardial infarction.  Fortunately she has recovered fairly well.  But nonetheless he had the patient had to do a lot more things around the house that his wife would usually do.  So he has been very stressed.  Nonetheless he seems to be handling things fairly well.  Unfortunately he is not working with his uncle for the time being.  He spends much of his time doing things around the house and he also has a popping working on  [employer].  The patient's mood is fair.  He interacts very appropriately with me. Past Medical History:  Past Medical History:  Diagnosis Date   Anxiety    Bipolar disorder (HCC)    Bipolar disorder (HCC)    Cardiac abnormality    Chest discomfort 06/18/2018   Chest tightness 10/11/2020   Cigarette smoker 06/18/2018   COPD (chronic obstructive pulmonary disease) (HCC)    Coronary artery calcification of native artery 10/09/2021   Elevated coronary artery calcium score 12/13/2020   Essential hypertension 06/18/2018   Generalized anxiety disorder 09/15/2017   Headache    Heart defect, congenital    History of COVID-19    History of CVA (cerebrovascular accident) 10/09/2021    History of repair of congenital atrial septal defect (ASD) 06/18/2018   History of TIA (transient ischemic attack)    History of tobacco use    Hypertension    Hypertension, essential, benign    Low back pain 07/14/2013   Migraine headache    Mixed dyslipidemia 08/05/2020   Mixed hyperlipidemia 10/09/2021   Neck pain    Palpitations    Pre-op evaluation 10/09/2021   Stroke (HCC)    Tobacco abuse    Ventricular septal defect     Past Surgical History:  Procedure Laterality Date   AMPUTATION FINGER / THUMB     CARDIAC SURGERY  at 47 years old    Family Psychiatric History: See intake H&P for full details. Reviewed, with no updates at this time.   Family History:  Family History  Problem Relation Age of Onset   Schizophrenia Father    Bipolar disorder Maternal Uncle    Bipolar disorder Maternal Grandmother     Social History:  Social History   Socioeconomic History   Marital status: Divorced    Spouse name: Not on file   Number of children: 2   Years of education: Not on file   Highest education level: 11th grade  Occupational History   Not on file  Tobacco Use   Smoking status:  Every Day    Current packs/day: 1.00    Types: Cigarettes   Smokeless tobacco: Never  Vaping Use   Vaping status: Never Used  Substance and Sexual Activity   Alcohol use: No   Drug use: No   Sexual activity: Not on file  Other Topics Concern   Not on file  Social History Narrative   Not on file   Social Determinants of Health   Financial Resource Strain: Medium Risk (04/18/2018)   Overall Financial Resource Strain (CARDIA)    Difficulty of Paying Living Expenses: Somewhat hard  Food Insecurity: No Food Insecurity (06/24/2021)   Received from [facility], [facility]   Hunger Vital Sign    Worried About Running Out of Food in the Last Year: Never true    Ran Out of Food in the Last Year: Never true  Transportation Needs: Unknown (04/18/2018)   PRAPARE - Doctor, general practice (Medical): No    Lack of Transportation (Non-Medical): Not on file  Physical Activity: Insufficiently Active (04/18/2018)   Exercise Vital Sign    Days of Exercise per Week: 7 days    Minutes of Exercise per Session: 20 min  Stress: Stress Concern Present (04/18/2018)   [name] of Occupational Health - Occupational Stress Questionnaire    Feeling of Stress : To some extent  Social Connections: Unknown (09/29/2021)   Received from [facility], [facility]   Social Network    Social Network: Not on file    Allergies:  Allergies  Allergen Reactions   Crestor [Rosuvastatin]     Body cramps    Metabolic Disorder Labs: No results found for: "HGBA1C", "MPG" No results found for: "PROLACTIN" Lab Results  Component Value Date   CHOL 208 (H) 06/13/2022   TRIG 125 06/13/2022   HDL 39 (L) 06/13/2022   CHOLHDL 5.3 (H) 06/13/2022   LDLCALC 146 (H) 06/13/2022   LDLCALC 104 (H) 03/08/2021   No results found for: "TSH"  Therapeutic Level Labs: No results found for: "LITHIUM" No results found for: "VALPROATE" No results found for: "CBMZ"  Current Medications: Current Outpatient Medications  Medication Sig Dispense Refill   albuterol (PROVENTIL HFA;VENTOLIN HFA) 108 (90 Base) MCG/ACT inhaler Inhale 1-2 puffs into the lungs daily as needed for wheezing or shortness of breath.     amLODipine (NORVASC) 5 MG tablet Take 1 tablet (5 mg total) by mouth daily. 90 tablet 3   aspirin EC 81 MG tablet Take 81 mg by mouth daily. Swallow whole.     cholecalciferol (VITAMIN D3) 25 MCG (1000 UNIT) tablet Take 1,000 Units by mouth daily.     Coenzyme Q10 (HM COQ-10) 200 MG capsule Take 1 capsule (200 mg total) by mouth daily. 90 capsule 3   divalproex (DEPAKOTE ER) 500 MG 24 hr tablet Take 500 mg by mouth daily.     Evolocumab (REPATHA SURECLICK) 140 MG/ML SOAJ Inject 140 mg into the skin every 14 (fourteen) days. 1 mL 0   Evolocumab (REPATHA SURECLICK) 140 MG/ML SOAJ  Inject 140 mg into the skin every 14 (fourteen) days. 6 mL 3   lisinopril (ZESTRIL) 40 MG tablet Take 0.5 tablets (20 mg total) by mouth daily. 45 tablet 2   nitroGLYCERIN (NITROSTAT) 0.4 MG SL tablet Place 0.4 mg under the tongue every 5 (five) minutes as needed for chest pain.     omega-3 acid ethyl esters (LOVAZA) 1 g capsule Take 2 capsules (2 g total) by mouth 2 (two)  times daily. 360 capsule 1   SYMBICORT 160-4.5 MCG/ACT inhaler Inhale 2 puffs into the lungs 2 (two) times daily.     ALPRAZolam (XANAX XR) 0.5 MG 24 hr tablet Take 1 tablet (0.5 mg total) by mouth at bedtime. 30 tablet 4   carbamazepine (TEGRETOL XR) 100 MG 12 hr tablet Take 1 tablet (100 mg total) by mouth 2 (two) times daily. 60 tablet 6   doxepin (SINEQUAN) 25 MG capsule Take 1 capsule (25 mg total) by mouth at bedtime. 30 capsule 4   No current facility-administered medications for this visit.    Musculoskeletal: Strength & Muscle Tone: within normal limits Gait & Station: normal Patient leans: N/A  Psychiatric Specialty Exam: ROS BH MD/PA/NP OP Progress Note  01/02/2023 4:02 PM Roy Koch  MRN:  [id number]  Chief Complaint: Irritability  HPI:   Visit Diagnosis: Mild neurocognitive d  This patient's first problem is that of vascular dementia.  The patient will go back to taking Tegretol 100 mg twice daily.  His second problem is insomnia.  The patient will go back to his Sinequan as well as his low-dose of Xanax 0.25 mg.  The combination of these 2 medicines help him sleep well.  He will return to see me in approximately 1 to 2 months.  I think the patient is reasonably stable but he clearly demonstrated that he benefits by being on his Tegretol. Past Psychiatric History: See intake H&P for full details. Reviewed, with no updates at this time.   Past Medical History:  Past Medical History:  Diagnosis Date   Anxiety    Bipolar disorder (HCC)    Bipolar disorder (HCC)    Cardiac abnormality    Chest  discomfort 06/18/2018   Chest tightness 10/11/2020   Cigarette smoker 06/18/2018   COPD (chronic obstructive pulmonary disease) (HCC)    Coronary artery calcification of native artery 10/09/2021   Elevated coronary artery calcium score 12/13/2020   Essential hypertension 06/18/2018   Generalized anxiety disorder 09/15/2017   Headache    Heart defect, congenital    History of COVID-19    History of CVA (cerebrovascular accident) 10/09/2021   History of repair of congenital atrial septal defect (ASD) 06/18/2018   History of TIA (transient ischemic attack)    History of tobacco use    Hypertension    Hypertension, essential, benign    Low back pain 07/14/2013   Migraine headache    Mixed dyslipidemia 08/05/2020   Mixed hyperlipidemia 10/09/2021   Neck pain    Palpitations    Pre-op evaluation 10/09/2021   Stroke (HCC)    Tobacco abuse    Ventricular septal defect     Past Surgical History:  Procedure Laterality Date   AMPUTATION FINGER / THUMB     CARDIAC SURGERY  at 47 years old    Family Psychiatric History: See intake H&P for full details. Reviewed, with no updates at this time.   Family History:  Family History  Problem Relation Age of Onset   Schizophrenia Father    Bipolar disorder Maternal Uncle    Bipolar disorder Maternal Grandmother     Social History:  Social History   Socioeconomic History   Marital status: Divorced    Spouse name: Not on file   Number of children: 2   Years of education: Not on file   Highest education level: 11th grade  Occupational History   Not on file  Tobacco Use   Smoking status: Every Day  Current packs/day: 1.00    Types: Cigarettes   Smokeless tobacco: Never  Vaping Use   Vaping status: Never Used  Substance and Sexual Activity   Alcohol use: No   Drug use: No   Sexual activity: Not on file  Other Topics Concern   Not on file  Social History Narrative   Not on file   Social Determinants of Health   Financial Resource  Strain: Medium Risk (04/18/2018)   Overall Financial Resource Strain (CARDIA)    Difficulty of Paying Living Expenses: Somewhat hard  Food Insecurity: No Food Insecurity (06/24/2021)   [facility] from [facility], [facility]   Hunger Vital Sign    Worried About Running Out of Food in the Last Year: Never true    Ran Out of Food in the Last Year: Never true  Transportation Needs: Unknown (04/18/2018)   PRAPARE - Administrator, Civil Service (Medical): No    Lack of Transportation (Non-Medical): Not on file  Physical Activity: Insufficiently Active (04/18/2018)   Exercise Vital Sign    Days of Exercise per Week: 7 days    Minutes of Exercise per Session: 20 min  Stress: Stress Concern Present (04/18/2018)   [name] of Occupational Health - Occupational Stress Questionnaire    Feeling of Stress : To some extent  Social Connections: Unknown (09/29/2021)   Received from [facility], [facility]   Social Network    Social Network: Not on file    Allergies:  Allergies  Allergen Reactions   Crestor [Rosuvastatin]     Body cramps    Metabolic Disorder Labs: No results found for: "HGBA1C", "MPG" No results found for: "PROLACTIN" Lab Results  Component Value Date   CHOL 208 (H) 06/13/2022   TRIG 125 06/13/2022   HDL 39 (L) 06/13/2022   CHOLHDL 5.3 (H) 06/13/2022   LDLCALC 146 (H) 06/13/2022   LDLCALC 104 (H) 03/08/2021   No results found for: "TSH"  Therapeutic Level Labs: No results found for: "LITHIUM" No results found for: "VALPROATE" No results found for: "CBMZ"  Current Medications: Current Outpatient Medications  Medication Sig Dispense Refill   albuterol (PROVENTIL HFA;VENTOLIN HFA) 108 (90 Base) MCG/ACT inhaler Inhale 1-2 puffs into the lungs daily as needed for wheezing or shortness of breath.     amLODipine (NORVASC) 5 MG tablet Take 1 tablet (5 mg total) by mouth daily. 90 tablet 3   aspirin EC 81 MG tablet Take 81 mg by mouth daily.  Swallow whole.     cholecalciferol (VITAMIN D3) 25 MCG (1000 UNIT) tablet Take 1,000 Units by mouth daily.     Coenzyme Q10 (HM COQ-10) 200 MG capsule Take 1 capsule (200 mg total) by mouth daily. 90 capsule 3   divalproex (DEPAKOTE ER) 500 MG 24 hr tablet Take 500 mg by mouth daily.     Evolocumab (REPATHA SURECLICK) 140 MG/ML SOAJ Inject 140 mg into the skin every 14 (fourteen) days. 1 mL 0   Evolocumab (REPATHA SURECLICK) 140 MG/ML SOAJ Inject 140 mg into the skin every 14 (fourteen) days. 6 mL 3   lisinopril (ZESTRIL) 40 MG tablet Take 0.5 tablets (20 mg total) by mouth daily. 45 tablet 2   nitroGLYCERIN (NITROSTAT) 0.4 MG SL tablet Place 0.4 mg under the tongue every 5 (five) minutes as needed for chest pain.     omega-3 acid ethyl esters (LOVAZA) 1 g capsule Take 2 capsules (2 g total) by mouth 2 (two) times daily. 360 capsule 1  SYMBICORT 160-4.5 MCG/ACT inhaler Inhale 2 puffs into the lungs 2 (two) times daily.     ALPRAZolam (XANAX XR) 0.5 MG 24 hr tablet Take 1 tablet (0.5 mg total) by mouth at bedtime. 30 tablet 4   carbamazepine (TEGRETOL XR) 100 MG 12 hr tablet Take 1 tablet (100 mg total) by mouth 2 (two) times daily. 60 tablet 6   doxepin (SINEQUAN) 25 MG capsule Take 1 capsule (25 mg total) by mouth at bedtime. 30 capsule 4   No current facility-administered medications for this visit.    Musculoskeletal: Strength & Muscle Tone: within normal limits Gait & Station: normal Patient leans: N/A  Psychiatric Specialty Exam: ROS  Blood pressure 122/85, pulse 71, height 6' (1.829 m), weight 244 lb (110.7 kg).Body mass index is 33.09 kg/m.  General Appearance: Casual and Disheveled  Eye Contact:  Fair  Speech:  Clear and Coherent and Normal Rate  Volume:  Normal  Mood:   Less irritable  Affect:  Appropriate and Congruent  Thought Process:  Goal Directed and Descriptions of Associations: Intact  Orientation:  Full (Time, Place, and Person)  Thought Content: Logical and  Focused on Xanax    Suicidal Thoughts:  No  Homicidal Thoughts:  No  Memory:  Immediate;   Poor  Judgement:  Fair  Insight:  Present, Shallow and Improving  Psychomotor Activity:  Normal  Concentration:  Concentration: Fair  Recall:  [employer] of Knowledge: Fair  Language: Fair  Akathisia:  Negative  Handed:  Right  AIMS (if indicated): not done  Assets:  Communication Skills Desire for Improvement  ADL's:  Intact  Cognition: WNL  Sleep:  Fair   Screenings: PHQ2-9    Flowsheet Row Patient Outreach Telephone from 03/12/2017 in [facility]  PHQ-2 Total Score 2  PHQ-9 Total Score 4        Assessment and Plan    This patient's diagnosis is mild neurocognitive disorder.  This is related to vascular dementia.  At this time we will continue taking Tegretol twice a day and a low-dose of Xanax at night for sleep.  He has been very stable and he will return to see me in 5 months. Labs Ordered: No orders of the defined types were placed in this encounter.   Labs Reviewed: na  Collateral Obtained/Records Reviewed: Wife is present and able to corroborate episodic agitation and explosive behaviors when he is angry  Plan:    Equetro 100 mg twice daily.  The patient return to see [name] in 2-1/2 months and shared how often he is been explosive.  At that time we will get blood work including a comprehensive metabolic panel.  Gypsy Balsam, MD 01/02/2023, 4:02 PM  Blood pressure 122/85, pulse 71, height 6' (1.829 m), weight 244 lb (110.7 kg).Body mass index is 33.09 kg/m.  General Appearance: Casual and Disheveled  Eye Contact:  Fair  Speech:  Clear and Coherent and Normal Rate  Volume:  Normal  Mood:   Less irritable  Affect:  Appropriate and Congruent  Thought Process:  Goal Directed and Descriptions of Associations: Intact  Orientation:  Full (Time, Place, and Person)  Thought Content: Logical and Focused on Xanax    Suicidal Thoughts:  No  Homicidal  Thoughts:  No  Memory:  Immediate;   Poor  Judgement:  Fair  Insight:  Present, Shallow and Improving  Psychomotor Activity:  Normal  Concentration:  Concentration: Fair  Recall:  [employer] of Knowledge: Fair  Language:  Fair  Akathisia:  Negative  Handed:  Right  AIMS (if indicated): not done  Assets:  Communication Skills Desire for Improvement  ADL's:  Intact  Cognition: WNL  Sleep:  Fair   Screenings: PHQ2-9    Flowsheet Row Patient Outreach Telephone from 03/12/2017 in [facility]  PHQ-2 Total Score 2  PHQ-9 Total Score 4        Assessment and Plan    This assessment and plan was written on 01/10/2022.  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  He has had irritability that is fairly well-controlled with Tegretol twice daily.  He will continue taking it and he takes Xanax a small dose point 5 at night which helps him sleep.  Overall this patient seems to be stable.  He will see me in person in 4 months. By phone spent 30 minutes patient at home I called fro home Status of current problems:  new to [employer] Ordered: No orders of the defined types were placed in this encounter.   Labs Reviewed: na  Collateral Obtained/Records Reviewed: Wife is present and able to corroborate episodic agitation and explosive behaviors when he is angry  Plan:    Equetro 100 mg twice daily.  The patient return to see [name] in 2-1/2 months and shared how often he is been explosive.  At that time we will get blood work including a comprehensive metabolic panel.  Gypsy Balsam, MD 01/02/2023, 4:02 PM

## 2023-01-31 DIAGNOSIS — L02416 Cutaneous abscess of left lower limb: Secondary | ICD-10-CM | POA: Diagnosis not present

## 2023-01-31 DIAGNOSIS — L0291 Cutaneous abscess, unspecified: Secondary | ICD-10-CM | POA: Diagnosis not present

## 2023-02-16 ENCOUNTER — Other Ambulatory Visit: Payer: Self-pay | Admitting: Cardiology

## 2023-03-09 DIAGNOSIS — J441 Chronic obstructive pulmonary disease with (acute) exacerbation: Secondary | ICD-10-CM | POA: Diagnosis not present

## 2023-03-09 DIAGNOSIS — Z20822 Contact with and (suspected) exposure to covid-19: Secondary | ICD-10-CM | POA: Diagnosis not present

## 2023-03-20 ENCOUNTER — Ambulatory Visit (HOSPITAL_BASED_OUTPATIENT_CLINIC_OR_DEPARTMENT_OTHER): Payer: Medicare HMO | Admitting: Psychiatry

## 2023-03-20 ENCOUNTER — Encounter (HOSPITAL_COMMUNITY): Payer: Self-pay | Admitting: Psychiatry

## 2023-03-20 VITALS — BP 122/85 | HR 87 | Resp 20 | Ht 72.0 in | Wt 251.0 lb

## 2023-03-20 DIAGNOSIS — F01511 Vascular dementia, unspecified severity, with agitation: Secondary | ICD-10-CM

## 2023-03-20 MED ORDER — ALPRAZOLAM ER 0.5 MG PO TB24: 0.5000 mg | ORAL_TABLET | Freq: Every day | ORAL | 4 refills | Status: DC

## 2023-03-20 MED ORDER — DOXEPIN HCL 25 MG PO CAPS: 25.0000 mg | ORAL_CAPSULE | Freq: Every day | ORAL | 4 refills | Status: DC

## 2023-03-20 NOTE — Progress Notes (Signed)
BH MD/PA/NP OP Progress Note  03/20/2023 4:11 PM Roy Koch  MRN:  [id number]    Visit Diagnosis: Mild neurocognitive disorder   Today the patient is seen in the office alone.  His wife is busy taking care of their granddaughter.  The patient was not very clean.  He just finished taking out the transmission of his wife's jeep.  The patient is actually quite stable.  He did share that he felt some mild degree increase in irritability but it never gets to violence.  He says he has learned how to control himself.  He sees himself as having a bipolar disorder and knows that he could lose control but he does not.  He takes his Tegretol regularly.  He takes doxepin to sleep together with a long-acting Xanax in the morning.  The patient denies daily depression.  He is quite talkative.  He is sleeping and eating well.  He has never been psychotic.  He drinks no alcohol and uses no drugs.  He lives in his own it was his mother's and was left to him.  The patient has strong opinions about people learning to be independent and standing on their own.  His son and her wife do not seem to be living up to this.  He describes his son as using drugs regularly.  They have a 66-year-old child who the patient and his wife are now taking care of her.  The patient refused to allow other kids in the house to live.  Near the 2 are working.  The patient actually is functioning quite well. Past Medical History:  Past Medical History:  Diagnosis Date   Anxiety    Bipolar disorder (HCC)    Bipolar disorder (HCC)    Cardiac abnormality    Chest discomfort 06/18/2018   Chest tightness 10/11/2020   Cigarette smoker 06/18/2018   COPD (chronic obstructive pulmonary disease) (HCC)    Coronary artery calcification of native artery 10/09/2021   Elevated coronary artery calcium score 12/13/2020   Essential hypertension 06/18/2018   Generalized anxiety disorder 09/15/2017   Headache    Heart defect, congenital    History of  COVID-19    History of CVA (cerebrovascular accident) 10/09/2021   History of repair of congenital atrial septal defect (ASD) 06/18/2018   History of TIA (transient ischemic attack)    History of tobacco use    Hypertension    Hypertension, essential, benign    Low back pain 07/14/2013   Migraine headache    Mixed dyslipidemia 08/05/2020   Mixed hyperlipidemia 10/09/2021   Neck pain    Palpitations    Pre-op evaluation 10/09/2021   Stroke (HCC)    Tobacco abuse    Ventricular septal defect     Past Surgical History:  Procedure Laterality Date   AMPUTATION FINGER / THUMB     CARDIAC SURGERY  at 47 years old    Family Psychiatric History: See intake H&P for full details. Reviewed, with no updates at this time.   Family History:  Family History  Problem Relation Age of Onset   Schizophrenia Father    Bipolar disorder Maternal Uncle    Bipolar disorder Maternal Grandmother     Social History:  Social History   Socioeconomic History   Marital status: Divorced    Spouse name: Not on file   Number of children: 2   Years of education: Not on file   Highest education level: 11th grade  Occupational History  Not on file  Tobacco Use   Smoking status: Every Day    Current packs/day: 1.00    Types: Cigarettes   Smokeless tobacco: Never  Vaping Use   Vaping status: Never Used  Substance and Sexual Activity   Alcohol use: No   Drug use: No   Sexual activity: Not on file  Other Topics Concern   Not on file  Social History Narrative   Not on file   Social Determinants of Health   Financial Resource Strain: Medium Risk (04/18/2018)   Overall Financial Resource Strain (CARDIA)    Difficulty of Paying Living Expenses: Somewhat hard  Food Insecurity: No Food Insecurity (06/24/2021)   Received from [facility], [facility]   Hunger Vital Sign    Worried About Running Out of Food in the Last Year: Never true    Ran Out of Food in the Last Year: Never true   Transportation Needs: Unknown (04/18/2018)   PRAPARE - Administrator, Civil Service (Medical): No    Lack of Transportation (Non-Medical): Not on file  Physical Activity: Insufficiently Active (04/18/2018)   Exercise Vital Sign    Days of Exercise per Week: 7 days    Minutes of Exercise per Session: 20 min  Stress: Stress Concern Present (04/18/2018)   [name] of Occupational Health - Occupational Stress Questionnaire    Feeling of Stress : To some extent  Social Connections: Unknown (09/29/2021)   Received from [facility]   Social Network    Social Network: Not on file    Allergies:  Allergies  Allergen Reactions   Crestor [Rosuvastatin]     Body cramps    Metabolic Disorder Labs: No results found for: "HGBA1C", "MPG" No results found for: "PROLACTIN" Lab Results  Component Value Date   CHOL 208 (H) 06/13/2022   TRIG 125 06/13/2022   HDL 39 (L) 06/13/2022   CHOLHDL 5.3 (H) 06/13/2022   LDLCALC 146 (H) 06/13/2022   LDLCALC 104 (H) 03/08/2021   No results found for: "TSH"  Therapeutic Level Labs: No results found for: "LITHIUM" No results found for: "VALPROATE" No results found for: "CBMZ"  Current Medications: Current Outpatient Medications  Medication Sig Dispense Refill   albuterol (PROVENTIL HFA;VENTOLIN HFA) 108 (90 Base) MCG/ACT inhaler Inhale 1-2 puffs into the lungs daily as needed for wheezing or shortness of breath.     ALPRAZolam (XANAX XR) 0.5 MG 24 hr tablet Take 1 tablet (0.5 mg total) by mouth at bedtime. 30 tablet 4   amLODipine (NORVASC) 5 MG tablet Take 1 tablet (5 mg total) by mouth daily. 90 tablet 3   aspirin EC 81 MG tablet Take 81 mg by mouth daily. Swallow whole.     carbamazepine (TEGRETOL XR) 100 MG 12 hr tablet Take 1 tablet (100 mg total) by mouth 2 (two) times daily. 60 tablet 6   cholecalciferol (VITAMIN D3) 25 MCG (1000 UNIT) tablet Take 1,000 Units by mouth daily.     Coenzyme Q10 (HM COQ-10)  200 MG capsule Take 1 capsule (200 mg total) by mouth daily. 90 capsule 3   divalproex (DEPAKOTE ER) 500 MG 24 hr tablet Take 500 mg by mouth daily.     doxepin (SINEQUAN) 25 MG capsule Take 1 capsule (25 mg total) by mouth at bedtime. 30 capsule 4   Evolocumab (REPATHA SURECLICK) 140 MG/ML SOAJ Inject 140 mg into the skin every 14 (fourteen) days. 1 mL 0   Evolocumab (REPATHA SURECLICK) 140 MG/ML  SOAJ Inject 140 mg into the skin every 14 (fourteen) days. 6 mL 3   lisinopril (ZESTRIL) 40 MG tablet Take 0.5 tablets (20 mg total) by mouth daily. 45 tablet 0   nitroGLYCERIN (NITROSTAT) 0.4 MG SL tablet Place 0.4 mg under the tongue every 5 (five) minutes as needed for chest pain.     omega-3 acid ethyl esters (LOVAZA) 1 g capsule Take 2 capsules (2 g total) by mouth 2 (two) times daily. 360 capsule 1   SYMBICORT 160-4.5 MCG/ACT inhaler Inhale 2 puffs into the lungs 2 (two) times daily.     No current facility-administered medications for this visit.    Musculoskeletal: Strength & Muscle Tone: within normal limits Gait & Station: normal Patient leans: N/A  Psychiatric Specialty Exam: ROS BH MD/PA/NP OP Progress Note  03/20/2023 4:11 PM Roy Koch  MRN:  [id number]  Chief Complaint: Irritability  HPI:   Visit Diagnosis: Mild neurocognitive d   This patient's diagnosis is vascular dementia.  It is associated with irritability.  His mood instability is very well controlled with Tegretol 100 mg twice daily.  He also has insomnia.  For this he takes doxepin 25 mg and it works very well.  The patient takes Xanax 0.5 mg SR in the morning which she says is remarkably effective.  He says on this regime the patient is calm stable and still work does not feel over sedated and is actually doing quite well.  We will continue these medicines.  He will return to see me in 3 months and at that time we will bring his wife with him. Past Psychiatric History: See intake H&P for full details. Reviewed,  with no updates at this time.   Past Medical History:  Past Medical History:  Diagnosis Date   Anxiety    Bipolar disorder (HCC)    Bipolar disorder (HCC)    Cardiac abnormality    Chest discomfort 06/18/2018   Chest tightness 10/11/2020   Cigarette smoker 06/18/2018   COPD (chronic obstructive pulmonary disease) (HCC)    Coronary artery calcification of native artery 10/09/2021   Elevated coronary artery calcium score 12/13/2020   Essential hypertension 06/18/2018   Generalized anxiety disorder 09/15/2017   Headache    Heart defect, congenital    History of COVID-19    History of CVA (cerebrovascular accident) 10/09/2021   History of repair of congenital atrial septal defect (ASD) 06/18/2018   History of TIA (transient ischemic attack)    History of tobacco use    Hypertension    Hypertension, essential, benign    Low back pain 07/14/2013   Migraine headache    Mixed dyslipidemia 08/05/2020   Mixed hyperlipidemia 10/09/2021   Neck pain    Palpitations    Pre-op evaluation 10/09/2021   Stroke (HCC)    Tobacco abuse    Ventricular septal defect     Past Surgical History:  Procedure Laterality Date   AMPUTATION FINGER / THUMB     CARDIAC SURGERY  at 47 years old    Family Psychiatric History: See intake H&P for full details. Reviewed, with no updates at this time.   Family History:  Family History  Problem Relation Age of Onset   Schizophrenia Father    Bipolar disorder Maternal Uncle    Bipolar disorder Maternal Grandmother     Social History:  Social History   Socioeconomic History   Marital status: Divorced    Spouse name: Not on file   Number of children:  2   Years of education: Not on file   Highest education level: 11th grade  Occupational History   Not on file  Tobacco Use   Smoking status: Every Day    Current packs/day: 1.00    Types: Cigarettes   Smokeless tobacco: Never  Vaping Use   Vaping status: Never Used  Substance and Sexual Activity    Alcohol use: No   Drug use: No   Sexual activity: Not on file  Other Topics Concern   Not on file  Social History Narrative   Not on file   Social Determinants of Health   Financial Resource Strain: Medium Risk (04/18/2018)   Overall Financial Resource Strain (CARDIA)    Difficulty of Paying Living Expenses: Somewhat hard  Food Insecurity: No Food Insecurity (06/24/2021)   Received from [facility], [facility]   Hunger Vital Sign    Worried About Running Out of Food in the Last Year: Never true    Ran Out of Food in the Last Year: Never true  Transportation Needs: Unknown (04/18/2018)   PRAPARE - Administrator, Civil Service (Medical): No    Lack of Transportation (Non-Medical): Not on file  Physical Activity: Insufficiently Active (04/18/2018)   Exercise Vital Sign    Days of Exercise per Week: 7 days    Minutes of Exercise per Session: 20 min  Stress: Stress Concern Present (04/18/2018)   [name] of Occupational Health - Occupational Stress Questionnaire    Feeling of Stress : To some extent  Social Connections: Unknown (09/29/2021)   Received from [facility], [facility]   Social Network    Social Network: Not on file    Allergies:  Allergies  Allergen Reactions   Crestor [Rosuvastatin]     Body cramps    Metabolic Disorder Labs: No results found for: "HGBA1C", "MPG" No results found for: "PROLACTIN" Lab Results  Component Value Date   CHOL 208 (H) 06/13/2022   TRIG 125 06/13/2022   HDL 39 (L) 06/13/2022   CHOLHDL 5.3 (H) 06/13/2022   LDLCALC 146 (H) 06/13/2022   LDLCALC 104 (H) 03/08/2021   No results found for: "TSH"  Therapeutic Level Labs: No results found for: "LITHIUM" No results found for: "VALPROATE" No results found for: "CBMZ"  Current Medications: Current Outpatient Medications  Medication Sig Dispense Refill   albuterol (PROVENTIL HFA;VENTOLIN HFA) 108 (90 Base) MCG/ACT inhaler Inhale 1-2 puffs into the lungs  daily as needed for wheezing or shortness of breath.     ALPRAZolam (XANAX XR) 0.5 MG 24 hr tablet Take 1 tablet (0.5 mg total) by mouth at bedtime. 30 tablet 4   amLODipine (NORVASC) 5 MG tablet Take 1 tablet (5 mg total) by mouth daily. 90 tablet 3   aspirin EC 81 MG tablet Take 81 mg by mouth daily. Swallow whole.     carbamazepine (TEGRETOL XR) 100 MG 12 hr tablet Take 1 tablet (100 mg total) by mouth 2 (two) times daily. 60 tablet 6   cholecalciferol (VITAMIN D3) 25 MCG (1000 UNIT) tablet Take 1,000 Units by mouth daily.     Coenzyme Q10 (HM COQ-10) 200 MG capsule Take 1 capsule (200 mg total) by mouth daily. 90 capsule 3   divalproex (DEPAKOTE ER) 500 MG 24 hr tablet Take 500 mg by mouth daily.     doxepin (SINEQUAN) 25 MG capsule Take 1 capsule (25 mg total) by mouth at bedtime. 30 capsule 4   Evolocumab (REPATHA SURECLICK) 140 MG/ML  SOAJ Inject 140 mg into the skin every 14 (fourteen) days. 1 mL 0   Evolocumab (REPATHA SURECLICK) 140 MG/ML SOAJ Inject 140 mg into the skin every 14 (fourteen) days. 6 mL 3   lisinopril (ZESTRIL) 40 MG tablet Take 0.5 tablets (20 mg total) by mouth daily. 45 tablet 0   nitroGLYCERIN (NITROSTAT) 0.4 MG SL tablet Place 0.4 mg under the tongue every 5 (five) minutes as needed for chest pain.     omega-3 acid ethyl esters (LOVAZA) 1 g capsule Take 2 capsules (2 g total) by mouth 2 (two) times daily. 360 capsule 1   SYMBICORT 160-4.5 MCG/ACT inhaler Inhale 2 puffs into the lungs 2 (two) times daily.     No current facility-administered medications for this visit.    Musculoskeletal: Strength & Muscle Tone: within normal limits Gait & Station: normal Patient leans: N/A  Psychiatric Specialty Exam: ROS  Blood pressure 122/85, pulse 87, resp. rate 20, height 6' (1.829 m), weight 251 lb (113.9 kg).Body mass index is 34.04 kg/m.  General Appearance: Casual and Disheveled  Eye Contact:  Fair  Speech:  Clear and Coherent and Normal Rate  Volume:  Normal   Mood:   Less irritable  Affect:  Appropriate and Congruent  Thought Process:  Goal Directed and Descriptions of Associations: Intact  Orientation:  Full (Time, Place, and Person)  Thought Content: Logical and Focused on Xanax    Suicidal Thoughts:  No  Homicidal Thoughts:  No  Memory:  Immediate;   Poor  Judgement:  Fair  Insight:  Present, Shallow and Improving  Psychomotor Activity:  Normal  Concentration:  Concentration: Fair  Recall:  [employer] of Knowledge: Fair  Language: Fair  Akathisia:  Negative  Handed:  Right  AIMS (if indicated): not done  Assets:  Communication Skills Desire for Improvement  ADL's:  Intact  Cognition: WNL  Sleep:  Fair   Screenings: PHQ2-9    Flowsheet Row Patient Outreach Telephone from 03/12/2017 in [facility]  PHQ-2 Total Score 2  PHQ-9 Total Score 4        Assessment and Plan    This patient's diagnosis is mild neurocognitive disorder.  This is related to vascular dementia.  At this time we will continue taking Tegretol twice a day and a low-dose of Xanax at night for sleep.  He has been very stable and he will return to see me in 5 months. Labs Ordered: No orders of the defined types were placed in this encounter.   Labs Reviewed: na  Collateral Obtained/Records Reviewed: Wife is present and able to corroborate episodic agitation and explosive behaviors when he is angry  Plan:    Equetro 100 mg twice daily.  The patient return to see [name] in 2-1/2 months and shared how often he is been explosive.  At that time we will get blood work including a comprehensive metabolic panel.  Gypsy Balsam, MD 03/20/2023, 4:11 PM  Blood pressure 122/85, pulse 87, resp. rate 20, height 6' (1.829 m), weight 251 lb (113.9 kg).Body mass index is 34.04 kg/m.  General Appearance: Casual and Disheveled  Eye Contact:  Fair  Speech:  Clear and Coherent and Normal Rate  Volume:  Normal  Mood:   Less irritable  Affect:   Appropriate and Congruent  Thought Process:  Goal Directed and Descriptions of Associations: Intact  Orientation:  Full (Time, Place, and Person)  Thought Content: Logical and Focused on Xanax    Suicidal Thoughts:  No  Homicidal Thoughts:  No  Memory:  Immediate;   Poor  Judgement:  Fair  Insight:  Present, Shallow and Improving  Psychomotor Activity:  Normal  Concentration:  Concentration: Fair  Recall:  [employer] of Knowledge: Fair  Language: Fair  Akathisia:  Negative  Handed:  Right  AIMS (if indicated): not done  Assets:  Communication Skills Desire for Improvement  ADL's:  Intact  Cognition: WNL  Sleep:  Fair   Screenings: PHQ2-9    Flowsheet Row Patient Outreach Telephone from 03/12/2017 in [facility]  PHQ-2 Total Score 2  PHQ-9 Total Score 4        Assessment and Plan    This assessment and plan was written on 01/10/2022.  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  He has had irritability that is fairly well-controlled with Tegretol twice daily.  He will continue taking it and he takes Xanax a small dose point 5 at night which helps him sleep.  Overall this patient seems to be stable.  He will see me in person in 4 months. By phone spent 30 minutes patient at home I called fro home Status of current problems:  new to [employer] Ordered: No orders of the defined types were placed in this encounter.   Labs Reviewed: na  Collateral Obtained/Records Reviewed: Wife is present and able to corroborate episodic agitation and explosive behaviors when he is angry  Plan:    Equetro 100 mg twice daily.  The patient return to see [name] in 2-1/2 months and shared how often he is been explosive.  At that time we will get blood work including a comprehensive metabolic panel.  Gypsy Balsam, MD 03/20/2023, 4:11 PM

## 2023-03-23 DIAGNOSIS — R051 Acute cough: Secondary | ICD-10-CM | POA: Diagnosis not present

## 2023-03-23 DIAGNOSIS — R059 Cough, unspecified: Secondary | ICD-10-CM | POA: Diagnosis not present

## 2023-03-23 DIAGNOSIS — J069 Acute upper respiratory infection, unspecified: Secondary | ICD-10-CM | POA: Diagnosis not present

## 2023-03-23 DIAGNOSIS — B9689 Other specified bacterial agents as the cause of diseases classified elsewhere: Secondary | ICD-10-CM | POA: Diagnosis not present

## 2023-03-25 DIAGNOSIS — E785 Hyperlipidemia, unspecified: Secondary | ICD-10-CM | POA: Diagnosis not present

## 2023-03-25 DIAGNOSIS — Z8673 Personal history of transient ischemic attack (TIA), and cerebral infarction without residual deficits: Secondary | ICD-10-CM | POA: Diagnosis not present

## 2023-03-25 DIAGNOSIS — I10 Essential (primary) hypertension: Secondary | ICD-10-CM | POA: Diagnosis not present

## 2023-03-25 DIAGNOSIS — J449 Chronic obstructive pulmonary disease, unspecified: Secondary | ICD-10-CM | POA: Diagnosis not present

## 2023-03-25 DIAGNOSIS — R1310 Dysphagia, unspecified: Secondary | ICD-10-CM | POA: Diagnosis not present

## 2023-03-25 DIAGNOSIS — R0789 Other chest pain: Secondary | ICD-10-CM | POA: Diagnosis not present

## 2023-03-25 DIAGNOSIS — I2510 Atherosclerotic heart disease of native coronary artery without angina pectoris: Secondary | ICD-10-CM | POA: Diagnosis not present

## 2023-03-25 DIAGNOSIS — Z8719 Personal history of other diseases of the digestive system: Secondary | ICD-10-CM | POA: Diagnosis not present

## 2023-03-25 DIAGNOSIS — T18128A Food in esophagus causing other injury, initial encounter: Secondary | ICD-10-CM | POA: Diagnosis not present

## 2023-03-25 DIAGNOSIS — R0602 Shortness of breath: Secondary | ICD-10-CM | POA: Diagnosis not present

## 2023-03-25 DIAGNOSIS — Z7951 Long term (current) use of inhaled steroids: Secondary | ICD-10-CM | POA: Diagnosis not present

## 2023-03-25 DIAGNOSIS — I252 Old myocardial infarction: Secondary | ICD-10-CM | POA: Diagnosis not present

## 2023-03-25 DIAGNOSIS — F17210 Nicotine dependence, cigarettes, uncomplicated: Secondary | ICD-10-CM | POA: Diagnosis not present

## 2023-03-25 DIAGNOSIS — K222 Esophageal obstruction: Secondary | ICD-10-CM | POA: Diagnosis not present

## 2023-03-25 DIAGNOSIS — R09A9 Foreign body sensation, other site: Secondary | ICD-10-CM | POA: Diagnosis not present

## 2023-03-25 DIAGNOSIS — R1319 Other dysphagia: Secondary | ICD-10-CM | POA: Diagnosis not present

## 2023-03-25 DIAGNOSIS — F319 Bipolar disorder, unspecified: Secondary | ICD-10-CM | POA: Diagnosis not present

## 2023-03-25 DIAGNOSIS — K2289 Other specified disease of esophagus: Secondary | ICD-10-CM | POA: Diagnosis not present

## 2023-03-25 DIAGNOSIS — T18120A Food in esophagus causing compression of trachea, initial encounter: Secondary | ICD-10-CM | POA: Diagnosis not present

## 2023-03-26 DIAGNOSIS — F17210 Nicotine dependence, cigarettes, uncomplicated: Secondary | ICD-10-CM | POA: Diagnosis not present

## 2023-03-26 DIAGNOSIS — T18128A Food in esophagus causing other injury, initial encounter: Secondary | ICD-10-CM | POA: Diagnosis not present

## 2023-03-26 DIAGNOSIS — I10 Essential (primary) hypertension: Secondary | ICD-10-CM | POA: Diagnosis not present

## 2023-03-26 DIAGNOSIS — R1319 Other dysphagia: Secondary | ICD-10-CM | POA: Diagnosis not present

## 2023-03-26 DIAGNOSIS — J449 Chronic obstructive pulmonary disease, unspecified: Secondary | ICD-10-CM | POA: Diagnosis not present

## 2023-03-26 DIAGNOSIS — F319 Bipolar disorder, unspecified: Secondary | ICD-10-CM | POA: Diagnosis not present

## 2023-03-26 DIAGNOSIS — I2510 Atherosclerotic heart disease of native coronary artery without angina pectoris: Secondary | ICD-10-CM | POA: Diagnosis not present

## 2023-03-26 DIAGNOSIS — Z8673 Personal history of transient ischemic attack (TIA), and cerebral infarction without residual deficits: Secondary | ICD-10-CM | POA: Diagnosis not present

## 2023-03-26 DIAGNOSIS — E785 Hyperlipidemia, unspecified: Secondary | ICD-10-CM | POA: Diagnosis not present

## 2023-03-26 DIAGNOSIS — I252 Old myocardial infarction: Secondary | ICD-10-CM | POA: Diagnosis not present

## 2023-04-12 DIAGNOSIS — R0789 Other chest pain: Secondary | ICD-10-CM | POA: Diagnosis not present

## 2023-04-12 DIAGNOSIS — G8929 Other chronic pain: Secondary | ICD-10-CM | POA: Diagnosis not present

## 2023-04-12 DIAGNOSIS — J069 Acute upper respiratory infection, unspecified: Secondary | ICD-10-CM | POA: Diagnosis not present

## 2023-04-12 DIAGNOSIS — G4733 Obstructive sleep apnea (adult) (pediatric): Secondary | ICD-10-CM | POA: Diagnosis not present

## 2023-04-12 DIAGNOSIS — M5441 Lumbago with sciatica, right side: Secondary | ICD-10-CM | POA: Diagnosis not present

## 2023-04-12 DIAGNOSIS — J449 Chronic obstructive pulmonary disease, unspecified: Secondary | ICD-10-CM | POA: Diagnosis not present

## 2023-04-12 DIAGNOSIS — R051 Acute cough: Secondary | ICD-10-CM | POA: Diagnosis not present

## 2023-04-12 DIAGNOSIS — R059 Cough, unspecified: Secondary | ICD-10-CM | POA: Diagnosis not present

## 2023-04-12 DIAGNOSIS — B9689 Other specified bacterial agents as the cause of diseases classified elsewhere: Secondary | ICD-10-CM | POA: Diagnosis not present

## 2023-04-14 ENCOUNTER — Other Ambulatory Visit (HOSPITAL_COMMUNITY): Payer: Self-pay | Admitting: Psychiatry

## 2023-04-14 DIAGNOSIS — J449 Chronic obstructive pulmonary disease, unspecified: Secondary | ICD-10-CM | POA: Diagnosis not present

## 2023-04-14 DIAGNOSIS — R0989 Other specified symptoms and signs involving the circulatory and respiratory systems: Secondary | ICD-10-CM | POA: Diagnosis not present

## 2023-04-14 DIAGNOSIS — Z20822 Contact with and (suspected) exposure to covid-19: Secondary | ICD-10-CM | POA: Diagnosis not present

## 2023-04-14 DIAGNOSIS — B379 Candidiasis, unspecified: Secondary | ICD-10-CM | POA: Diagnosis not present

## 2023-04-14 DIAGNOSIS — Z7951 Long term (current) use of inhaled steroids: Secondary | ICD-10-CM | POA: Diagnosis not present

## 2023-04-14 DIAGNOSIS — B370 Candidal stomatitis: Secondary | ICD-10-CM | POA: Diagnosis not present

## 2023-04-14 DIAGNOSIS — J029 Acute pharyngitis, unspecified: Secondary | ICD-10-CM | POA: Diagnosis not present

## 2023-04-14 DIAGNOSIS — F17210 Nicotine dependence, cigarettes, uncomplicated: Secondary | ICD-10-CM | POA: Diagnosis not present

## 2023-04-14 DIAGNOSIS — Z79899 Other long term (current) drug therapy: Secondary | ICD-10-CM | POA: Diagnosis not present

## 2023-05-07 DIAGNOSIS — M5416 Radiculopathy, lumbar region: Secondary | ICD-10-CM | POA: Diagnosis not present

## 2023-05-16 DIAGNOSIS — S069XAS Unspecified intracranial injury with loss of consciousness status unknown, sequela: Secondary | ICD-10-CM | POA: Diagnosis not present

## 2023-05-16 DIAGNOSIS — R519 Headache, unspecified: Secondary | ICD-10-CM | POA: Diagnosis not present

## 2023-05-16 DIAGNOSIS — G8929 Other chronic pain: Secondary | ICD-10-CM | POA: Diagnosis not present

## 2023-05-17 DIAGNOSIS — J4489 Other specified chronic obstructive pulmonary disease: Secondary | ICD-10-CM | POA: Diagnosis not present

## 2023-05-17 DIAGNOSIS — R0683 Snoring: Secondary | ICD-10-CM | POA: Diagnosis not present

## 2023-05-17 DIAGNOSIS — Z720 Tobacco use: Secondary | ICD-10-CM | POA: Diagnosis not present

## 2023-05-19 DIAGNOSIS — M4726 Other spondylosis with radiculopathy, lumbar region: Secondary | ICD-10-CM | POA: Diagnosis not present

## 2023-05-19 DIAGNOSIS — M48061 Spinal stenosis, lumbar region without neurogenic claudication: Secondary | ICD-10-CM | POA: Diagnosis not present

## 2023-05-19 DIAGNOSIS — M5416 Radiculopathy, lumbar region: Secondary | ICD-10-CM | POA: Diagnosis not present

## 2023-05-19 DIAGNOSIS — M4808 Spinal stenosis, sacral and sacrococcygeal region: Secondary | ICD-10-CM | POA: Diagnosis not present

## 2023-05-19 DIAGNOSIS — M5117 Intervertebral disc disorders with radiculopathy, lumbosacral region: Secondary | ICD-10-CM | POA: Diagnosis not present

## 2023-06-20 ENCOUNTER — Ambulatory Visit (HOSPITAL_BASED_OUTPATIENT_CLINIC_OR_DEPARTMENT_OTHER): Payer: 59 | Admitting: Psychiatry

## 2023-06-20 DIAGNOSIS — F01518 Vascular dementia, unspecified severity, with other behavioral disturbance: Secondary | ICD-10-CM | POA: Diagnosis not present

## 2023-06-20 MED ORDER — CARBAMAZEPINE ER 100 MG PO TB12: 100.0000 mg | ORAL_TABLET | Freq: Two times a day (BID) | ORAL | 6 refills | Status: DC

## 2023-06-20 MED ORDER — ALPRAZOLAM ER 0.5 MG PO TB24: 0.5000 mg | ORAL_TABLET | Freq: Every day | ORAL | 4 refills | Status: DC

## 2023-06-20 MED ORDER — DOXEPIN HCL 25 MG PO CAPS: ORAL_CAPSULE | ORAL | 4 refills | Status: DC

## 2023-06-20 NOTE — Progress Notes (Signed)
BH MD/PA/NP OP Progress Note  06/20/2023 4:03 PM Roy Koch  MRN:  [id number]    Visit Diagnosis: Mild neurocognitive disorder    Today the patient is seen with his wife.  The patient is not doing quite as well.  He feels more irritable but still has the insight not to overreact or be impulsive.  He takes his medicines just as prescribed.  His irritability might be related to the fact that he is sleeping less well.  The patient and his wife take care of their grandson who is 9 years old whose name is Roy Koch.  [name] parents are not together.  The mother is essentially homeless and lives with a man in a car.  The father lives with his father but does not work.  Both the patient and his wife are upset with Roy Koch's parents.  The father is the stepson.  He is the son of the wife.  The patient continues to work full-time as does his wife.  He is eating a bit more than usual and is gaining weight.  Patient shares with me that he has learned not to explode also because he spent 3 episodes in prison.  The patient feels it is important to be definitive and strong when parenting.  He thinks that his stepson and his wife use drugs and are very responsible.  He wants to care for her interested and treat him better and tell him to do what is right.  Amazingly Roy Koch who is 48 years old made a comment about a gun which triggered long enforcement to investigate the house and to talk to Roy Koch.  According to the patient's wife he met a [employer] gun.  It should be noted the family does have guns that are locked up the safe in a safe and never been taken out for well over a year.  Overall the patient is stable. Past Medical History:  Past Medical History:  Diagnosis Date   Anxiety    Bipolar disorder (HCC)    Bipolar disorder (HCC)    Cardiac abnormality    Chest discomfort 06/18/2018   Chest tightness 10/11/2020   Cigarette smoker 06/18/2018   COPD (chronic obstructive pulmonary disease) (HCC)    Coronary  artery calcification of native artery 10/09/2021   Elevated coronary artery calcium score 12/13/2020   Essential hypertension 06/18/2018   Generalized anxiety disorder 09/15/2017   Headache    Heart defect, congenital    History of COVID-19    History of CVA (cerebrovascular accident) 10/09/2021   History of repair of congenital atrial septal defect (ASD) 06/18/2018   History of TIA (transient ischemic attack)    History of tobacco use    Hypertension    Hypertension, essential, benign    Low back pain 07/14/2013   Migraine headache    Mixed dyslipidemia 08/05/2020   Mixed hyperlipidemia 10/09/2021   Neck pain    Palpitations    Pre-op evaluation 10/09/2021   Stroke (HCC)    Tobacco abuse    Ventricular septal defect     Past Surgical History:  Procedure Laterality Date   AMPUTATION FINGER / THUMB     CARDIAC SURGERY  at 48 years old    Family Psychiatric History: See intake H&P for full details. Reviewed, with no updates at this time.   Family History:  Family History  Problem Relation Age of Onset   Schizophrenia Father    Bipolar disorder Maternal Uncle    Bipolar disorder Maternal Grandmother  Social History:  Social History   Socioeconomic History   Marital status: Divorced    Spouse name: Not on file   Number of children: 2   Years of education: Not on file   Highest education level: 11th grade  Occupational History   Not on file  Tobacco Use   Smoking status: Every Day    Current packs/day: 1.00    Types: Cigarettes   Smokeless tobacco: Never  Vaping Use   Vaping status: Never Used  Substance and Sexual Activity   Alcohol use: No   Drug use: No   Sexual activity: Not on file  Other Topics Concern   Not on file  Social History Narrative   Not on file   Social Drivers of Health   Financial Resource Strain: Low Risk  (05/16/2023)   Received from [facility]   Overall Financial Resource Strain (CARDIA)    Difficulty of Paying Living Expenses: Not  hard at all  Food Insecurity: Low Risk  (05/17/2023)   Received from [facility]   Hunger Vital Sign    Worried About Running Out of Food in the Last Year: Never true    Ran Out of Food in the Last Year: Never true  Transportation Needs: No Transportation Needs (05/17/2023)   Received from [facility]    In the past 12 months, has lack of reliable transportation kept you from medical appointments, meetings, work or from getting things needed for daily living? : No  Physical Activity: Insufficiently Active (04/18/2018)   Exercise Vital Sign    Days of Exercise per Week: 7 days    Minutes of Exercise per Session: 20 min  Stress: Stress Concern Present (04/18/2018)   [facility] [employer] - Occupational Stress Questionnaire    Feeling of Stress : To some extent  Social Connections: Unknown (09/29/2021)   Received from [facility]   Social Network    Social Network: Not on file    Allergies:  Allergies  Allergen Reactions   Crestor [Rosuvastatin]     Body cramps    Metabolic Disorder Labs: No results found for: "HGBA1C", "MPG" No results found for: "PROLACTIN" Lab Results  Component Value Date   CHOL 208 (H) 06/13/2022   TRIG 125 06/13/2022   HDL 39 (L) 06/13/2022   CHOLHDL 5.3 (H) 06/13/2022   LDLCALC 146 (H) 06/13/2022   LDLCALC 104 (H) 03/08/2021   No results found for: "TSH"  Therapeutic Level Labs: No results found for: "LITHIUM" No results found for: "VALPROATE" No results found for: "CBMZ"  Current Medications: Current Outpatient Medications  Medication Sig Dispense Refill   albuterol (PROVENTIL HFA;VENTOLIN HFA) 108 (90 Base) MCG/ACT inhaler Inhale 1-2 puffs into the lungs daily as needed for wheezing or shortness of breath.     ALPRAZolam (XANAX XR) 0.5 MG 24 hr tablet Take 1 tablet (0.5 mg total) by mouth at bedtime. 30 tablet 4   amLODipine (NORVASC) 5 MG tablet Take 1 tablet (5 mg total) by mouth  daily. 90 tablet 3   aspirin EC 81 MG tablet Take 81 mg by mouth daily. Swallow whole.     carbamazepine (TEGRETOL XR) 100 MG 12 hr tablet Take 1 tablet (100 mg total) by mouth 2 (two) times daily. 60 tablet 6   cholecalciferol (VITAMIN D3) 25 MCG (1000 UNIT) tablet Take 1,000 Units by mouth daily.     Coenzyme Q10 (HM COQ-10) 200 MG capsule Take 1 capsule (200 mg  total) by mouth daily. 90 capsule 3   divalproex (DEPAKOTE ER) 500 MG 24 hr tablet Take 500 mg by mouth daily.     doxepin (SINEQUAN) 25 MG capsule 2 qhs 60 capsule 4   Evolocumab (REPATHA SURECLICK) 140 MG/ML SOAJ Inject 140 mg into the skin every 14 (fourteen) days. 1 mL 0   Evolocumab (REPATHA SURECLICK) 140 MG/ML SOAJ Inject 140 mg into the skin every 14 (fourteen) days. 6 mL 3   lisinopril (ZESTRIL) 40 MG tablet Take 0.5 tablets (20 mg total) by mouth daily. 45 tablet 0   nitroGLYCERIN (NITROSTAT) 0.4 MG SL tablet Place 0.4 mg under the tongue every 5 (five) minutes as needed for chest pain.     omega-3 acid ethyl esters (LOVAZA) 1 g capsule Take 2 capsules (2 g total) by mouth 2 (two) times daily. 360 capsule 1   SYMBICORT 160-4.5 MCG/ACT inhaler Inhale 2 puffs into the lungs 2 (two) times daily.     No current facility-administered medications for this visit.    Musculoskeletal: Strength & Muscle Tone: within normal limits Gait & Station: normal Patient leans: N/A  Psychiatric Specialty Exam: ROS BH MD/PA/NP OP Progress Note  06/20/2023 4:03 PM Roy Koch  MRN:  [id number]  Chief Complaint: Irritability  HPI:   Visit Diagnosis: Mild neurocognitive d   This patient's diagnosis is vascular dementia.  It is associated with irritability.  His mood instability is very well controlled with Tegretol 100 mg twice daily.  He also has insomnia.  For this he takes doxepin 25 mg and it works very well.  The patient takes Xanax 0.5 mg SR in the morning which she says is remarkably effective.  He says on this regime the  patient is calm stable and still work does not feel over sedated and is actually doing quite well.  We will continue these medicines.  He will return to see me in 3 months and at that time we will bring his wife with him. Past Psychiatric History: See intake H&P for full details. Reviewed, with no updates at this time.   Past Medical History:  Past Medical History:  Diagnosis Date   Anxiety    Bipolar disorder (HCC)    Bipolar disorder (HCC)    Cardiac abnormality    Chest discomfort 06/18/2018   Chest tightness 10/11/2020   Cigarette smoker 06/18/2018   COPD (chronic obstructive pulmonary disease) (HCC)    Coronary artery calcification of native artery 10/09/2021   Elevated coronary artery calcium score 12/13/2020   Essential hypertension 06/18/2018   Generalized anxiety disorder 09/15/2017   Headache    Heart defect, congenital    History of COVID-19    History of CVA (cerebrovascular accident) 10/09/2021   History of repair of congenital atrial septal defect (ASD) 06/18/2018   History of TIA (transient ischemic attack)    History of tobacco use    Hypertension    Hypertension, essential, benign    Low back pain 07/14/2013   Migraine headache    Mixed dyslipidemia 08/05/2020   Mixed hyperlipidemia 10/09/2021   Neck pain    Palpitations    Pre-op evaluation 10/09/2021   Stroke (HCC)    Tobacco abuse    Ventricular septal defect     Past Surgical History:  Procedure Laterality Date   AMPUTATION FINGER / THUMB     CARDIAC SURGERY  at 48 years old    Family Psychiatric History: See intake H&P for full details. Reviewed, with no updates  at this time.   Family History:  Family History  Problem Relation Age of Onset   Schizophrenia Father    Bipolar disorder Maternal Uncle    Bipolar disorder Maternal Grandmother     Social History:  Social History   Socioeconomic History   Marital status: Divorced    Spouse name: Not on file   Number of children: 2   Years of education:  Not on file   Highest education level: 11th grade  Occupational History   Not on file  Tobacco Use   Smoking status: Every Day    Current packs/day: 1.00    Types: Cigarettes   Smokeless tobacco: Never  Vaping Use   Vaping status: Never Used  Substance and Sexual Activity   Alcohol use: No   Drug use: No   Sexual activity: Not on file  Other Topics Concern   Not on file  Social History Narrative   Not on file   Social Drivers of Health   Financial Resource Strain: Low Risk  (05/16/2023)   Received from [facility]   Overall Financial Resource Strain (CARDIA)    Difficulty of Paying Living Expenses: Not hard at all  Food Insecurity: Low Risk  (05/17/2023)   Received from [facility]   Hunger Vital Sign    Worried About Running Out of Food in the Last Year: Never true    Ran Out of Food in the Last Year: Never true  Transportation Needs: No Transportation Needs (05/17/2023)   Received from [facility]    In the past 12 months, has lack of reliable transportation kept you from medical appointments, meetings, work or from getting things needed for daily living? : No  Physical Activity: Insufficiently Active (04/18/2018)   Exercise Vital Sign    Days of Exercise per Week: 7 days    Minutes of Exercise per Session: 20 min  Stress: Stress Concern Present (04/18/2018)   [facility] [employer] - Occupational Stress Questionnaire    Feeling of Stress : To some extent  Social Connections: Unknown (09/29/2021)   Received from [facility], [facility]   Social Network    Social Network: Not on file    Allergies:  Allergies  Allergen Reactions   Crestor [Rosuvastatin]     Body cramps    Metabolic Disorder Labs: No results found for: "HGBA1C", "MPG" No results found for: "PROLACTIN" Lab Results  Component Value Date   CHOL 208 (H) 06/13/2022   TRIG 125 06/13/2022   HDL 39 (L) 06/13/2022   CHOLHDL 5.3 (H) 06/13/2022   LDLCALC  146 (H) 06/13/2022   LDLCALC 104 (H) 03/08/2021   No results found for: "TSH"  Therapeutic Level Labs: No results found for: "LITHIUM" No results found for: "VALPROATE" No results found for: "CBMZ"  Current Medications: Current Outpatient Medications  Medication Sig Dispense Refill   albuterol (PROVENTIL HFA;VENTOLIN HFA) 108 (90 Base) MCG/ACT inhaler Inhale 1-2 puffs into the lungs daily as needed for wheezing or shortness of breath.     ALPRAZolam (XANAX XR) 0.5 MG 24 hr tablet Take 1 tablet (0.5 mg total) by mouth at bedtime. 30 tablet 4   amLODipine (NORVASC) 5 MG tablet Take 1 tablet (5 mg total) by mouth daily. 90 tablet 3   aspirin EC 81 MG tablet Take 81 mg by mouth daily. Swallow whole.     carbamazepine (TEGRETOL XR) 100 MG 12 hr tablet Take 1 tablet (100 mg total) by mouth 2 (  two) times daily. 60 tablet 6   cholecalciferol (VITAMIN D3) 25 MCG (1000 UNIT) tablet Take 1,000 Units by mouth daily.     Coenzyme Q10 (HM COQ-10) 200 MG capsule Take 1 capsule (200 mg total) by mouth daily. 90 capsule 3   divalproex (DEPAKOTE ER) 500 MG 24 hr tablet Take 500 mg by mouth daily.     doxepin (SINEQUAN) 25 MG capsule 2 qhs 60 capsule 4   Evolocumab (REPATHA SURECLICK) 140 MG/ML SOAJ Inject 140 mg into the skin every 14 (fourteen) days. 1 mL 0   Evolocumab (REPATHA SURECLICK) 140 MG/ML SOAJ Inject 140 mg into the skin every 14 (fourteen) days. 6 mL 3   lisinopril (ZESTRIL) 40 MG tablet Take 0.5 tablets (20 mg total) by mouth daily. 45 tablet 0   nitroGLYCERIN (NITROSTAT) 0.4 MG SL tablet Place 0.4 mg under the tongue every 5 (five) minutes as needed for chest pain.     omega-3 acid ethyl esters (LOVAZA) 1 g capsule Take 2 capsules (2 g total) by mouth 2 (two) times daily. 360 capsule 1   SYMBICORT 160-4.5 MCG/ACT inhaler Inhale 2 puffs into the lungs 2 (two) times daily.     No current facility-administered medications for this visit.    Musculoskeletal: Strength & Muscle Tone: within  normal limits Gait & Station: normal Patient leans: N/A  Psychiatric Specialty Exam: ROS  There were no vitals taken for this visit.There is no height or weight on file to calculate BMI.  General Appearance: Casual and Disheveled  Eye Contact:  Fair  Speech:  Clear and Coherent and Normal Rate  Volume:  Normal  Mood:   Less irritable  Affect:  Appropriate and Congruent  Thought Process:  Goal Directed and Descriptions of Associations: Intact  Orientation:  Full (Time, Place, and Person)  Thought Content: Logical and Focused on Xanax    Suicidal Thoughts:  No  Homicidal Thoughts:  No  Memory:  Immediate;   Poor  Judgement:  Fair  Insight:  Present, Shallow and Improving  Psychomotor Activity:  Normal  Concentration:  Concentration: Fair  Recall:  [employer] of Knowledge: Fair  Language: Fair  Akathisia:  Negative  Handed:  Right  AIMS (if indicated): not done  Assets:  Communication Skills Desire for Improvement  ADL's:  Intact  Cognition: WNL  Sleep:  Fair   Screenings: PHQ2-9    Flowsheet Row Patient Outreach Telephone from 03/12/2017 in [facility]  PHQ-2 Total Score 2  PHQ-9 Total Score 4        Assessment and Plan    This patient's diagnosis is mild neurocognitive disorder.  This is related to vascular dementia.  At this time we will continue taking Tegretol twice a day and a low-dose of Xanax at night for sleep.  He has been very stable and he will return to see me in 5 months. Labs Ordered: No orders of the defined types were placed in this encounter.   Labs Reviewed: na  Collateral Obtained/Records Reviewed: Wife is present and able to corroborate episodic agitation and explosive behaviors when he is angry  Plan:    Equetro 100 mg twice daily.  The patient return to see Roy Koch in 2-1/2 months and shared how often he is been explosive.  At that time we will get blood work including a comprehensive metabolic panel.  Gypsy Balsam,  MD 06/20/2023, 4:03 PM  There were no vitals taken for this visit.There is no height or weight on  file to calculate BMI.  General Appearance: Casual and Disheveled  Eye Contact:  Fair  Speech:  Clear and Coherent and Normal Rate  Volume:  Normal  Mood:   Less irritable  Affect:  Appropriate and Congruent  Thought Process:  Goal Directed and Descriptions of Associations: Intact  Orientation:  Full (Time, Place, and Person)  Thought Content: Logical and Focused on Xanax    Suicidal Thoughts:  No  Homicidal Thoughts:  No  Memory:  Immediate;   Poor  Judgement:  Fair  Insight:  Present, Shallow and Improving  Psychomotor Activity:  Normal  Concentration:  Concentration: Fair  Recall:  [employer] of Knowledge: Fair  Language: Fair  Akathisia:  Negative  Handed:  Right  AIMS (if indicated): not done  Assets:  Communication Skills Desire for Improvement  ADL's:  Intact  Cognition: WNL  Sleep:  Fair   Screenings: PHQ2-9    Flowsheet Row Patient Outreach Telephone from 03/12/2017 in [facility]  PHQ-2 Total Score 2  PHQ-9 Total Score 4        Assessment and Plan   This patient's diagnosis is vascular dementia.  He has issues with impulses but they are well-controlled taking Tegretol twice a day.  He also takes some Xanax long-acting 0.5 mg every night which helps him sleep and reduce his anxiety.  Today her intervention is to double his doxepin to take 50 mg instead of 25 mg.  He will be asked to come back in just 2 months.  It is my hope that with better sleep he will be even less irritable.  He has not exploded into violence but just feels more irritable and upset with things.  He continues to work full-time. Status of current problems:  new to [employer] Ordered: No orders of the defined types were placed in this encounter.   Labs Reviewed: na  Collateral Obtained/Records Reviewed: Wife is present and able to corroborate episodic agitation and explosive  behaviors when he is angry  Plan:    Equetro 100 mg twice daily.  The patient return to see Roy Koch in 2-1/2 months and shared how often he is been explosive.  At that time we will get blood work including a comprehensive metabolic panel.  Gypsy Balsam, MD 06/20/2023, 4:03 PM

## 2023-06-30 ENCOUNTER — Other Ambulatory Visit: Payer: Self-pay | Admitting: Cardiology

## 2023-07-02 ENCOUNTER — Other Ambulatory Visit: Payer: Self-pay

## 2023-07-02 DIAGNOSIS — E782 Mixed hyperlipidemia: Secondary | ICD-10-CM

## 2023-07-02 MED ORDER — OMEGA-3-ACID ETHYL ESTERS 1 G PO CAPS: 2.0000 | ORAL_CAPSULE | Freq: Two times a day (BID) | ORAL | 0 refills | Status: DC

## 2023-07-12 ENCOUNTER — Other Ambulatory Visit (HOSPITAL_COMMUNITY): Payer: Self-pay | Admitting: Psychiatry

## 2023-07-24 ENCOUNTER — Other Ambulatory Visit: Payer: Self-pay | Admitting: Cardiology

## 2023-07-25 ENCOUNTER — Other Ambulatory Visit: Payer: Self-pay | Admitting: Cardiology

## 2023-07-26 ENCOUNTER — Encounter: Payer: Self-pay | Admitting: Cardiology

## 2023-07-26 ENCOUNTER — Ambulatory Visit: Payer: 59 | Attending: Cardiology | Admitting: Cardiology

## 2023-07-26 VITALS — BP 116/77 | HR 103 | Ht 72.0 in | Wt 265.4 lb

## 2023-07-26 DIAGNOSIS — Z720 Tobacco use: Secondary | ICD-10-CM

## 2023-07-26 DIAGNOSIS — R931 Abnormal findings on diagnostic imaging of heart and coronary circulation: Secondary | ICD-10-CM | POA: Diagnosis not present

## 2023-07-26 DIAGNOSIS — I10 Essential (primary) hypertension: Secondary | ICD-10-CM | POA: Diagnosis not present

## 2023-07-26 DIAGNOSIS — E782 Mixed hyperlipidemia: Secondary | ICD-10-CM

## 2023-07-26 DIAGNOSIS — Q249 Congenital malformation of heart, unspecified: Secondary | ICD-10-CM

## 2023-07-26 NOTE — Progress Notes (Signed)
 Cardiology Office Note:    Date:  07/26/2023   ID:  [name], [name] 1976-05-17, MRN [id number]  PCP:  Casper Harrison, NP  Cardiologist:  Garwin Brothers, MD   Referring MD: Francee Piccolo I, NP    ASSESSMENT:    1. Mixed dyslipidemia   2. Elevated coronary artery calcium score   3. Essential hypertension   4. Mixed hyperlipidemia   5. Heart defect, congenital   6. Tobacco abuse    PLAN:    In order of problems listed above:  Elevated calcium score: Secondary prevention stressed with the patient.  Importance of compliance with diet medication stressed any vocalized understanding.  He was advised to ambulate to the best of his ability.   Essential hypertension: Blood pressure stable and diet was emphasized.  Lifestyle modification urged. Mixed dyslipidemia: On PCSK9 injections he will come back in the next few days for blood work.  He has not had a lipid check in the past several months.  I cautioned him against this. Obesity: Weight reduction stressed diet emphasized and he promises to do better. Patient will be seen in follow-up appointment in 6 months or earlier if the patient has any concerns.    Medication Adjustments/Labs and Tests Ordered: Current medicines are reviewed at length with the patient today.  Concerns regarding medicines are outlined above.  Orders Placed This Encounter  Procedures   Hepatic function panel   Lipid panel   Basic metabolic panel   EKG 12-Lead   No orders of the defined types were placed in this encounter.    No chief complaint on file.    History of Present Illness:    Roy Koch is a 48 y.o. male.  Patient has past medical history of elevated calcium score, essential hypertension, mixed dyslipidemia and unfortunately continues to smoke.  Overall he leads a sedentary lifestyle because of orthopedic issues.  No chest pain orthopnea or PND.  At the time of my evaluation, the patient is alert awake oriented and in no distress.  Past  Medical History:  Diagnosis Date   Anxiety    Bipolar disorder (HCC)    Bipolar disorder (HCC)    Cardiac abnormality    Chest discomfort 06/18/2018   Chest tightness 10/11/2020   Cigarette smoker 06/18/2018   COPD (chronic obstructive pulmonary disease) (HCC)    Coronary artery calcification of native artery 10/09/2021   Elevated coronary artery calcium score 12/13/2020   Essential hypertension 06/18/2018   Generalized anxiety disorder 09/15/2017   Headache    Heart defect, congenital    History of COVID-19    History of CVA (cerebrovascular accident) 10/09/2021   History of repair of congenital atrial septal defect (ASD) 06/18/2018   History of TIA (transient ischemic attack)    History of tobacco use    Hypertension    Hypertension, essential, benign    Low back pain 07/14/2013   Migraine headache    Mixed dyslipidemia 08/05/2020   Mixed hyperlipidemia 10/09/2021   Neck pain    Palpitations    Pre-op evaluation 10/09/2021   Stroke (HCC)    Tobacco abuse    Ventricular septal defect     Past Surgical History:  Procedure Laterality Date   AMPUTATION FINGER / THUMB     CARDIAC SURGERY  at 48 years old    Current Medications: Current Meds  Medication Sig   albuterol (PROVENTIL HFA;VENTOLIN HFA) 108 (90 Base) MCG/ACT inhaler Inhale 1-2 puffs into the lungs daily  as needed for wheezing or shortness of breath.   ALPRAZolam (XANAX XR) 0.5 MG 24 hr tablet Take 1 tablet (0.5 mg total) by mouth at bedtime.   amLODipine (NORVASC) 5 MG tablet Take 1 tablet (5 mg total) by mouth daily.   aspirin EC 81 MG tablet Take 81 mg by mouth daily. Swallow whole.   carbamazepine (TEGRETOL XR) 100 MG 12 hr tablet Take 1 tablet (100 mg total) by mouth 2 (two) times daily.   cholecalciferol (VITAMIN D3) 25 MCG (1000 UNIT) tablet Take 1,000 Units by mouth daily.   divalproex (DEPAKOTE ER) 500 MG 24 hr tablet Take 500 mg by mouth daily.   doxepin (SINEQUAN) 25 MG capsule 2 qhs   Evolocumab (REPATHA  SURECLICK) 140 MG/ML SOAJ Inject 140 mg into the skin every 14 (fourteen) days.   Evolocumab (REPATHA SURECLICK) 140 MG/ML SOAJ Inject 140 mg into the skin every 14 (fourteen) days.   lisinopril (ZESTRIL) 40 MG tablet Take 0.5 tablets (20 mg total) by mouth daily. Patient needs appointment for further refills. 1 st attempt   nitroGLYCERIN (NITROSTAT) 0.4 MG SL tablet Place 0.4 mg under the tongue every 5 (five) minutes as needed for chest pain.   omega-3 acid ethyl esters (LOVAZA) 1 g capsule Take 2 capsules (2 g total) by mouth 2 (two) times daily. Patient needs appointment for further refills. 1 st attempt   SYMBICORT 160-4.5 MCG/ACT inhaler Inhale 2 puffs into the lungs 2 (two) times daily.     Allergies:   Crestor [rosuvastatin]   Social History   Socioeconomic History   Marital status: Divorced    Spouse name: Not on file   Number of children: 2   Years of education: Not on file   Highest education level: 11th grade  Occupational History   Not on file  Tobacco Use   Smoking status: Every Day    Current packs/day: 1.00    Types: Cigarettes   Smokeless tobacco: Never  Vaping Use   Vaping status: Never Used  Substance and Sexual Activity   Alcohol use: No   Drug use: No   Sexual activity: Not on file  Other Topics Concern   Not on file  Social History Narrative   Not on file   Social Drivers of Health   Financial Resource Strain: Low Risk  (05/16/2023)   Received from [facility]   Overall Financial Resource Strain (CARDIA)    Difficulty of Paying Living Expenses: Not hard at all  Food Insecurity: Low Risk  (05/17/2023)   Received from [facility]   Hunger Vital Sign    Worried About Running Out of Food in the Last Year: Never true    Ran Out of Food in the Last Year: Never true  Transportation Needs: No Transportation Needs (05/17/2023)   Received from [facility]    In the past 12 months, has lack of reliable transportation kept you from  medical appointments, meetings, work or from getting things needed for daily living? : No  Physical Activity: Insufficiently Active (04/18/2018)   Exercise Vital Sign    Days of Exercise per Week: 7 days    Minutes of Exercise per Session: 20 min  Stress: Stress Concern Present (04/18/2018)   [name] of Occupational [employer] - Occupational Stress Questionnaire    Feeling of Stress : To some extent  Social Connections: Unknown (09/29/2021)   Received from [facility]   Social Network    Social Network:  Not on file     Family History: The patient's family history includes Bipolar disorder in his maternal grandmother and maternal uncle; Schizophrenia in his father.  ROS:   Please see the history of present illness.    All other systems reviewed and are negative.  EKGs/Labs/Other Studies Reviewed:    The following studies were reviewed today: .Marland KitchenEKG Interpretation Date/Time:  Thursday July 26 2023 15:17:43 EST Ventricular Rate:  103 PR Interval:  174 QRS Duration:  92 QT Interval:  352 QTC Calculation: 461 R Axis:   111  Text Interpretation: Sinus tachycardia Left posterior fascicular block Possible Anterior infarct , age undetermined Abnormal ECG When compared with ECG of 23-Sep-2001 11:56, Vent. rate has increased BY  59 BPM QRS voltage has decreased Nonspecific T wave abnormality no longer evident in Inferior leads T wave inversion no longer evident in Anterior leads QT has shortened Confirmed by Belva Crome [phone]) on 07/26/2023 3:22:21 PM     Recent Labs: No results found for requested labs within last 365 days.  Recent Lipid Panel    Component Value Date/Time   CHOL 208 (H) 06/13/2022 0840   TRIG 125 06/13/2022 0840   HDL 39 (L) 06/13/2022 0840   CHOLHDL 5.3 (H) 06/13/2022 0840   LDLCALC 146 (H) 06/13/2022 0840    Physical Exam:    VS:  BP 116/77   Pulse (!) 103   Ht 6' (1.829 m)   Wt 265 lb 6.4 oz (120.4 kg)   SpO2 93%   BMI  35.99 kg/m     Wt Readings from Last 3 Encounters:  07/26/23 265 lb 6.4 oz (120.4 kg)  06/09/22 237 lb 12.8 oz (107.9 kg)  10/20/21 240 lb (108.9 kg)     GEN: Patient is in no acute distress HEENT: Normal NECK: No JVD; No carotid bruits LYMPHATICS: No lymphadenopathy CARDIAC: Hear sounds regular, 2/6 systolic murmur at the apex. RESPIRATORY:  Clear to auscultation without rales, wheezing or rhonchi  ABDOMEN: Soft, non-tender, non-distended MUSCULOSKELETAL:  No edema; No deformity  SKIN: Warm and dry NEUROLOGIC:  Alert and oriented x 3 PSYCHIATRIC:  Normal affect   Signed, Garwin Brothers, MD  07/26/2023 3:49 PM    [facility]

## 2023-07-26 NOTE — Patient Instructions (Signed)
 Medication Instructions:  Your physician recommends that you continue on your current medications as directed. Please refer to the Current Medication list given to you today.  *If you need a refill on your cardiac medications before your next appointment, please call your pharmacy*   Lab Work: Your physician recommends that you return for lab work in: the next few days You need to have labs done when you are fasting.  You can come Monday through Friday 8:30 am to 12:00 pm and 1:15 to 4:30. You do not need to make an appointment as the order has already been placed. The labs you are going to have done are BMET,  LFT and Lipids.  If you have labs (blood work) drawn today and your tests are completely normal, you will receive your results only by: MyChart Message (if you have MyChart) OR A paper copy in the mail If you have any lab test that is abnormal or we need to change your treatment, we will call you to review the results.   Testing/Procedures: None Ordered   Follow-Up: At [facility], you and your health needs are our priority.  As part of our continuing mission to provide you with exceptional heart care, we have created designated Provider Care Teams.  These Care Teams include your primary Cardiologist (physician) and Advanced Practice Providers (APPs -  Physician Assistants and Nurse Practitioners) who all work together to provide you with the care you need, when you need it.  We recommend signing up for the patient portal called "MyChart".  Sign up information is provided on this After Visit Summary.  MyChart is used to connect with patients for Virtual Visits (Telemedicine).  Patients are able to view lab/test results, encounter notes, upcoming appointments, etc.  Non-urgent messages can be sent to your provider as well.   To learn more about what you can do with MyChart, go to [email or web address].    Your next appointment:   9  month follow up

## 2023-08-03 ENCOUNTER — Other Ambulatory Visit (HOSPITAL_COMMUNITY): Payer: Self-pay

## 2023-08-03 ENCOUNTER — Telehealth: Payer: Self-pay

## 2023-08-03 NOTE — Telephone Encounter (Signed)
 Pharmacy Patient Advocate Encounter   Received notification from CoverMyMeds that prior authorization for REPATHA is required/requested.   Insurance verification completed.   The patient is insured through [facility] .   Per test claim: PA required; PA started via CoverMyMeds. KEY [id number] . Please see clinical question(s) below that I am not finding the answer to in her chart and advise. PLAN REQUIRES LABS WITHIN LAST 120 DAYS FOR RENEWAL, PLEASE ADVISE

## 2023-08-22 ENCOUNTER — Ambulatory Visit (HOSPITAL_COMMUNITY): Payer: 59 | Admitting: Psychiatry

## 2023-08-23 ENCOUNTER — Other Ambulatory Visit: Payer: Self-pay | Admitting: Cardiology

## 2023-09-11 ENCOUNTER — Other Ambulatory Visit: Payer: Self-pay

## 2023-09-11 ENCOUNTER — Ambulatory Visit (HOSPITAL_BASED_OUTPATIENT_CLINIC_OR_DEPARTMENT_OTHER): Admitting: Psychiatry

## 2023-09-11 ENCOUNTER — Encounter (HOSPITAL_COMMUNITY): Payer: Self-pay | Admitting: Psychiatry

## 2023-09-11 VITALS — BP 117/76 | HR 71 | Ht 72.0 in | Wt 271.0 lb

## 2023-09-11 DIAGNOSIS — Z79899 Other long term (current) drug therapy: Secondary | ICD-10-CM | POA: Diagnosis not present

## 2023-09-11 DIAGNOSIS — F03918 Unspecified dementia, unspecified severity, with other behavioral disturbance: Secondary | ICD-10-CM

## 2023-09-11 MED ORDER — CARBAMAZEPINE ER 100 MG PO TB12: 100.0000 mg | ORAL_TABLET | Freq: Two times a day (BID) | ORAL | 6 refills | Status: DC

## 2023-09-11 MED ORDER — DOXEPIN HCL 25 MG PO CAPS: ORAL_CAPSULE | ORAL | 4 refills | Status: DC

## 2023-09-11 MED ORDER — ALPRAZOLAM ER 0.5 MG PO TB24: 0.5000 mg | ORAL_TABLET | Freq: Every day | ORAL | 4 refills | Status: DC

## 2023-09-11 NOTE — Addendum Note (Signed)
 Addended by: Gustave Lindeman E on: 09/11/2023 03:16 PM   Modules accepted: Orders

## 2023-09-11 NOTE — Progress Notes (Signed)
 BH MD/PA/NP OP Progress Note  09/11/2023 2:35 PM Roy Koch  MRN:  [id number]    Visit Diagnosis: Mild neurocognitive disorder     Today the patient is seen with his wife.  I think he is doing fairly well.  His irritability is well-controlled taking Tegretol 100 mg twice daily.  The patient still has some chronic problems with sleep.  He sleeps only 2 or 3 hours then he is up and has a difficult time going back to sleep.  He has a feeling sleepy and somewhat fatigued during the day and even takes naps.  The patient no longer works.  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  The patient does have some chronic migraines.  He recently had his carbamazepine increased which is used for migraines from his neurologist.  The patient is irritated a lot by the phone calls to come to his wife.  His wife gets calls from their children.  These are adult children.  One of them is incarcerated at this time.  The patient smokes a small small amount of marijuana but it seems to calm him.  The patient recently had a sleep study and was diagnosed with sleep apnea.  He will be starting on a CPAP machine shortly.  The patient's anger and rage which is a common emotion for this individual has really been suppressed over the years since being on the mood stabilizer Tegretol. Past Medical History:  Past Medical History:  Diagnosis Date   Anxiety    Bipolar disorder (HCC)    Bipolar disorder (HCC)    Cardiac abnormality    Chest discomfort 06/18/2018   Chest tightness 10/11/2020   Cigarette smoker 06/18/2018   COPD (chronic obstructive pulmonary disease) (HCC)    Coronary artery calcification of native artery 10/09/2021   Elevated coronary artery calcium score 12/13/2020   Essential hypertension 06/18/2018   Generalized anxiety disorder 09/15/2017   Headache    Heart defect, congenital    History of COVID-19    History of CVA (cerebrovascular accident) 10/09/2021   History of  repair of congenital atrial septal defect (ASD) 06/18/2018   History of TIA (transient ischemic attack)    History of tobacco use    Hypertension    Hypertension, essential, benign    Low back pain 07/14/2013   Migraine headache    Mixed dyslipidemia 08/05/2020   Mixed hyperlipidemia 10/09/2021   Neck pain    Palpitations    Pre-op evaluation 10/09/2021   Stroke (HCC)    Tobacco abuse    Ventricular septal defect     Past Surgical History:  Procedure Laterality Date   AMPUTATION FINGER / THUMB     CARDIAC SURGERY  at 48 years old    Family Psychiatric History: See intake H&P for full details. Reviewed, with no updates at this time.   Family History:  Family History  Problem Relation Age of Onset   Schizophrenia Father    Bipolar disorder Maternal Uncle    Bipolar disorder Maternal Grandmother     Social History:  Social History   Socioeconomic History   Marital status: Divorced    Spouse name: Not on file   Number of children: 2   Years of education: Not on file   Highest education level: 11th grade  Occupational History   Not on file  Tobacco Use   Smoking status: Every Day    Current packs/day: 1.00    Types:  Cigarettes   Smokeless tobacco: Never  Vaping Use   Vaping status: Never Used  Substance and Sexual Activity   Alcohol use: No   Drug use: No   Sexual activity: Not on file  Other Topics Concern   Not on file  Social History Narrative   Not on file   Social Drivers of Health   Financial Resource Strain: Low Risk  (05/16/2023)   Received from [facility]   Overall Financial Resource Strain (CARDIA)    Difficulty of Paying Living Expenses: Not hard at all  Food Insecurity: Low Risk  (08/27/2023)   Received from [facility]   Hunger Vital Sign    Worried About Running Out of Food in the Last Year: Never true    Ran Out of Food in the Last Year: Never true  Transportation Needs: No Transportation Needs (08/27/2023)   Received from [facility]    In the past 12 months, has lack of reliable transportation kept you from medical appointments, meetings, work or from getting things needed for daily living? : No  Physical Activity: Insufficiently Active (04/18/2018)   Exercise Vital Sign    Days of Exercise per Week: 7 days    Minutes of Exercise per Session: 20 min  Stress: Stress Concern Present (04/18/2018)   [name] of Occupational Health - Occupational Stress Questionnaire    Feeling of Stress : To some extent  Social Connections: Unknown (09/29/2021)   Received from [facility]   Social Network    Social Network: Not on file    Allergies:  Allergies  Allergen Reactions   Crestor [Rosuvastatin]     Body cramps    Metabolic Disorder Labs: No results found for: "HGBA1C", "MPG" No results found for: "PROLACTIN" Lab Results  Component Value Date   CHOL 208 (H) 06/13/2022   TRIG 125 06/13/2022   HDL 39 (L) 06/13/2022   CHOLHDL 5.3 (H) 06/13/2022   LDLCALC 146 (H) 06/13/2022   LDLCALC 104 (H) 03/08/2021   No results found for: "TSH"  Therapeutic Level Labs: No results found for: "LITHIUM" No results found for: "VALPROATE" No results found for: "CBMZ"  Current Medications: Current Outpatient Medications  Medication Sig Dispense Refill   albuterol (PROVENTIL HFA;VENTOLIN HFA) 108 (90 Base) MCG/ACT inhaler Inhale 1-2 puffs into the lungs daily as needed for wheezing or shortness of breath.     ALPRAZolam (XANAX XR) 0.5 MG 24 hr tablet Take 1 tablet (0.5 mg total) by mouth at bedtime. 30 tablet 4   amLODipine (NORVASC) 5 MG tablet Take 1 tablet (5 mg total) by mouth daily. 90 tablet 3   aspirin EC 81 MG tablet Take 81 mg by mouth daily. Swallow whole.     carbamazepine (TEGRETOL XR) 100 MG 12 hr tablet Take 1 tablet (100 mg total) by mouth 2 (two) times daily. 60 tablet 6   cholecalciferol (VITAMIN D3) 25 MCG (1000 UNIT) tablet Take 1,000 Units by mouth daily.     Coenzyme  Q10 (HM COQ-10) 200 MG capsule Take 1 capsule (200 mg total) by mouth daily. (Patient not taking: Reported on 07/26/2023) 90 capsule 3   divalproex (DEPAKOTE ER) 500 MG 24 hr tablet Take 500 mg by mouth daily.     doxepin (SINEQUAN) 25 MG capsule 3 qhs 90 capsule 4   Evolocumab (REPATHA SURECLICK) 140 MG/ML SOAJ Inject 140 mg into the skin every 14 (fourteen) days. 6 mL 2   lisinopril (ZESTRIL) 40 MG  tablet Take 0.5 tablets (20 mg total) by mouth daily. 45 tablet 2   nitroGLYCERIN (NITROSTAT) 0.4 MG SL tablet Place 0.4 mg under the tongue every 5 (five) minutes as needed for chest pain.     omega-3 acid ethyl esters (LOVAZA) 1 g capsule Take 2 capsules (2 g total) by mouth 2 (two) times daily. Patient needs appointment for further refills. 1 st attempt 120 capsule 0   SYMBICORT 160-4.5 MCG/ACT inhaler Inhale 2 puffs into the lungs 2 (two) times daily.     No current facility-administered medications for this visit.    Musculoskeletal: Strength & Muscle Tone: within normal limits Gait & Station: normal Patient leans: N/A  Psychiatric Specialty Exam: ROS BH MD/PA/NP OP Progress Note  09/11/2023 2:35 PM RAVI TUCCILLO  MRN:  [id number]  Chief Complaint: Irritability  HPI:   Visit Diagnosis: Mild neurocognitive d   This patient's diagnosis is vascular dementia.  It is associated with irritability.  His mood instability is very well controlled with Tegretol 100 mg twice daily.  He also has insomnia.  For this he takes doxepin 25 mg and it works very well.  The patient takes Xanax 0.5 mg SR in the morning which she says is remarkably effective.  He says on this regime the patient is calm stable and still work does not feel over sedated and is actually doing quite well.  We will continue these medicines.  He will return to see me in 3 months and at that time we will bring his wife with him. Past Psychiatric History: See intake H&P for full details. Reviewed, with no updates at this  time.   Past Medical History:  Past Medical History:  Diagnosis Date   Anxiety    Bipolar disorder (HCC)    Bipolar disorder (HCC)    Cardiac abnormality    Chest discomfort 06/18/2018   Chest tightness 10/11/2020   Cigarette smoker 06/18/2018   COPD (chronic obstructive pulmonary disease) (HCC)    Coronary artery calcification of native artery 10/09/2021   Elevated coronary artery calcium score 12/13/2020   Essential hypertension 06/18/2018   Generalized anxiety disorder 09/15/2017   Headache    Heart defect, congenital    History of COVID-19    History of CVA (cerebrovascular accident) 10/09/2021   History of repair of congenital atrial septal defect (ASD) 06/18/2018   History of TIA (transient ischemic attack)    History of tobacco use    Hypertension    Hypertension, essential, benign    Low back pain 07/14/2013   Migraine headache    Mixed dyslipidemia 08/05/2020   Mixed hyperlipidemia 10/09/2021   Neck pain    Palpitations    Pre-op evaluation 10/09/2021   Stroke (HCC)    Tobacco abuse    Ventricular septal defect     Past Surgical History:  Procedure Laterality Date   AMPUTATION FINGER / THUMB     CARDIAC SURGERY  at 48 years old    Family Psychiatric History: See intake H&P for full details. Reviewed, with no updates at this time.   Family History:  Family History  Problem Relation Age of Onset   Schizophrenia Father    Bipolar disorder Maternal Uncle    Bipolar disorder Maternal Grandmother     Social History:  Social History   Socioeconomic History   Marital status: Divorced    Spouse name: Not on file   Number of children: 2   Years of education: Not on file  Highest education level: 11th grade  Occupational History   Not on file  Tobacco Use   Smoking status: Every Day    Current packs/day: 1.00    Types: Cigarettes   Smokeless tobacco: Never  Vaping Use   Vaping status: Never Used  Substance and Sexual Activity   Alcohol use: No   Drug use:  No   Sexual activity: Not on file  Other Topics Concern   Not on file  Social History Narrative   Not on file   Social Drivers of Health   Financial Resource Strain: Low Risk  (05/16/2023)   Received from [facility]   Overall Financial Resource Strain (CARDIA)    Difficulty of Paying Living Expenses: Not hard at all  Food Insecurity: Low Risk  (08/27/2023)   Received from [facility]   Hunger Vital Sign    Worried About Running Out of Food in the Last Year: Never true    Ran Out of Food in the Last Year: Never true  Transportation Needs: No Transportation Needs (08/27/2023)   Received from [facility]    In the past 12 months, has lack of reliable transportation kept you from medical appointments, meetings, work or from getting things needed for daily living? : No  Physical Activity: Insufficiently Active (04/18/2018)   Exercise Vital Sign    Days of Exercise per Week: 7 days    Minutes of Exercise per Session: 20 min  Stress: Stress Concern Present (04/18/2018)   [name] of Occupational Health - Occupational Stress Questionnaire    Feeling of Stress : To some extent  Social Connections: Unknown (09/29/2021)   Received from [facility]   Social Network    Social Network: Not on file    Allergies:  Allergies  Allergen Reactions   Crestor [Rosuvastatin]     Body cramps    Metabolic Disorder Labs: No results found for: "HGBA1C", "MPG" No results found for: "PROLACTIN" Lab Results  Component Value Date   CHOL 208 (H) 06/13/2022   TRIG 125 06/13/2022   HDL 39 (L) 06/13/2022   CHOLHDL 5.3 (H) 06/13/2022   LDLCALC 146 (H) 06/13/2022   LDLCALC 104 (H) 03/08/2021   No results found for: "TSH"  Therapeutic Level Labs: No results found for: "LITHIUM" No results found for: "VALPROATE" No results found for: "CBMZ"  Current Medications: Current Outpatient Medications  Medication Sig Dispense Refill   albuterol (PROVENTIL  HFA;VENTOLIN HFA) 108 (90 Base) MCG/ACT inhaler Inhale 1-2 puffs into the lungs daily as needed for wheezing or shortness of breath.     ALPRAZolam (XANAX XR) 0.5 MG 24 hr tablet Take 1 tablet (0.5 mg total) by mouth at bedtime. 30 tablet 4   amLODipine (NORVASC) 5 MG tablet Take 1 tablet (5 mg total) by mouth daily. 90 tablet 3   aspirin EC 81 MG tablet Take 81 mg by mouth daily. Swallow whole.     carbamazepine (TEGRETOL XR) 100 MG 12 hr tablet Take 1 tablet (100 mg total) by mouth 2 (two) times daily. 60 tablet 6   cholecalciferol (VITAMIN D3) 25 MCG (1000 UNIT) tablet Take 1,000 Units by mouth daily.     Coenzyme Q10 (HM COQ-10) 200 MG capsule Take 1 capsule (200 mg total) by mouth daily. (Patient not taking: Reported on 07/26/2023) 90 capsule 3   divalproex (DEPAKOTE ER) 500 MG 24 hr tablet Take 500 mg by mouth daily.     doxepin (SINEQUAN) 25 MG capsule  3 qhs 90 capsule 4   Evolocumab (REPATHA SURECLICK) 140 MG/ML SOAJ Inject 140 mg into the skin every 14 (fourteen) days. 6 mL 2   lisinopril (ZESTRIL) 40 MG tablet Take 0.5 tablets (20 mg total) by mouth daily. 45 tablet 2   nitroGLYCERIN (NITROSTAT) 0.4 MG SL tablet Place 0.4 mg under the tongue every 5 (five) minutes as needed for chest pain.     omega-3 acid ethyl esters (LOVAZA) 1 g capsule Take 2 capsules (2 g total) by mouth 2 (two) times daily. Patient needs appointment for further refills. 1 st attempt 120 capsule 0   SYMBICORT 160-4.5 MCG/ACT inhaler Inhale 2 puffs into the lungs 2 (two) times daily.     No current facility-administered medications for this visit.    Musculoskeletal: Strength & Muscle Tone: within normal limits Gait & Station: normal Patient leans: N/A  Psychiatric Specialty Exam: ROS  Blood pressure 117/76, pulse 71, height 6' (1.829 m), weight 271 lb (122.9 kg).Body mass index is 36.75 kg/m.  General Appearance: Casual and Disheveled  Eye Contact:  Fair  Speech:  Clear and Coherent and Normal Rate   Volume:  Normal  Mood:   Less irritable  Affect:  Appropriate and Congruent  Thought Process:  Goal Directed and Descriptions of Associations: Intact  Orientation:  Full (Time, Place, and Person)  Thought Content: Logical and Focused on Xanax    Suicidal Thoughts:  No  Homicidal Thoughts:  No  Memory:  Immediate;   Poor  Judgement:  Fair  Insight:  Present, Shallow and Improving  Psychomotor Activity:  Normal  Concentration:  Concentration: Fair  Recall:  [employer] of Knowledge: Fair  Language: Fair  Akathisia:  Negative  Handed:  Right  AIMS (if indicated): not done  Assets:  Communication Skills Desire for Improvement  ADL's:  Intact  Cognition: WNL  Sleep:  Fair   Screenings: PHQ2-9    Flowsheet Row Patient Outreach Telephone from 03/12/2017 in [facility]  PHQ-2 Total Score 2  PHQ-9 Total Score 4        Assessment and Plan     This patient's diagnosis is mild neurocognitive disorder.  His irritability is well controlled with Tegretol.  He also takes Xanax 0.5 mg slow release at night.  This is related to his second problem which is insomnia.  Today we are going to increase his doxepin to taking 3 at night.  This will be a total of 75 mg.  Importantly the patient also is going to be starting on a CPAP machine which I hope will help his sleep irritability and has positive effects on his cardiovascular system.  He has had multiple MIs and strokes.  Overall though emotionally he is fairly stable. Labs Ordered: Orders Placed This Encounter  Procedures   CBC with Differential/Platelet    Standing Status:   Future    Number of Occurrences:   1    Expected Date:   09/11/2023    Expiration Date:   09/10/2024   Comprehensive metabolic panel with GFR    Standing Status:   Future    Number of Occurrences:   1    Expected Date:   09/11/2023    Expiration Date:   09/10/2024   Carbamazepine level, total    Standing Status:   Future    Number of Occurrences:    1    Expected Date:   09/11/2023    Expiration Date:   09/10/2024    Labs  Reviewed: na  Collateral Obtained/Records Reviewed: Wife is present and able to corroborate episodic agitation and explosive behaviors when he is angry  Plan:    Equetro 100 mg twice daily.  The patient return to see [name] in 2-1/2 months and shared how often he is been explosive.  At that time we will get blood work including a comprehensive metabolic panel.  Gypsy Balsam, MD 09/11/2023, 2:35 PM  Blood pressure 117/76, pulse 71, height 6' (1.829 m), weight 271 lb (122.9 kg).Body mass index is 36.75 kg/m.  General Appearance: Casual and Disheveled  Eye Contact:  Fair  Speech:  Clear and Coherent and Normal Rate  Volume:  Normal  Mood:   Less irritable  Affect:  Appropriate and Congruent  Thought Process:  Goal Directed and Descriptions of Associations: Intact  Orientation:  Full (Time, Place, and Person)  Thought Content: Logical and Focused on Xanax    Suicidal Thoughts:  No  Homicidal Thoughts:  No  Memory:  Immediate;   Poor  Judgement:  Fair  Insight:  Present, Shallow and Improving  Psychomotor Activity:  Normal  Concentration:  Concentration: Fair  Recall:  [employer] of Knowledge: Fair  Language: Fair  Akathisia:  Negative  Handed:  Right  AIMS (if indicated): not done  Assets:  Communication Skills Desire for Improvement  ADL's:  Intact  Cognition: WNL  Sleep:  Fair   Screenings: PHQ2-9    Flowsheet Row Patient Outreach Telephone from 03/12/2017 in [facility]  PHQ-2 Total Score 2  PHQ-9 Total Score 4        Assessment and Plan   This patient's diagnosis is vascular dementia.  He has issues with impulses but they are well-controlled taking Tegretol twice a day.  He also takes some Xanax long-acting 0.5 mg every night which helps him sleep and reduce his anxiety.  Today her intervention is to double his doxepin to take 50 mg instead of 25 mg.  He will be asked to  come back in just 2 months.  It is my hope that with better sleep he will be even less irritable.  He has not exploded into violence but just feels more irritable and upset with things.  He continues to work full-time. Status of current problems:  new to writer  Labs Ordered: Orders Placed This Encounter  Procedures   CBC with Differential/Platelet    Standing Status:   Future    Number of Occurrences:   1    Expected Date:   09/11/2023    Expiration Date:   09/10/2024   Comprehensive metabolic panel with GFR    Standing Status:   Future    Number of Occurrences:   1    Expected Date:   09/11/2023    Expiration Date:   09/10/2024   Carbamazepine level, total    Standing Status:   Future    Number of Occurrences:   1    Expected Date:   09/11/2023    Expiration Date:   09/10/2024    Labs Reviewed: na  Collateral Obtained/Records Reviewed: Wife is present and able to corroborate episodic agitation and explosive behaviors when he is angry  Plan:    Equetro 100 mg twice daily.  The patient return to see [name] in 2-1/2 months and shared how often he is been explosive.  At that time we will get blood work including a comprehensive metabolic panel.  Gypsy Balsam, MD 09/11/2023, 2:35 PM

## 2023-09-13 LAB — CBC WITH DIFFERENTIAL/PLATELET
Basophils Absolute: 0.1 10*3/uL (ref 0.0–0.2)
Basos: 1 %
EOS (ABSOLUTE): 0.3 10*3/uL (ref 0.0–0.4)
Eos: 5 %
Hematocrit: 47.5 % (ref 37.5–51.0)
Hemoglobin: 15.8 g/dL (ref 13.0–17.7)
Immature Grans (Abs): 0 10*3/uL (ref 0.0–0.1)
Immature Granulocytes: 0 %
Lymphocytes Absolute: 2.3 10*3/uL (ref 0.7–3.1)
Lymphs: 35 %
MCH: 31.7 pg (ref 26.6–33.0)
MCHC: 33.3 g/dL (ref 31.5–35.7)
MCV: 95 fL (ref 79–97)
Monocytes Absolute: 0.8 10*3/uL (ref 0.1–0.9)
Monocytes: 13 %
Neutrophils Absolute: 3.1 10*3/uL (ref 1.4–7.0)
Neutrophils: 46 %
Platelets: 198 10*3/uL (ref 150–450)
RBC: 4.98 x10E6/uL (ref 4.14–5.80)
RDW: 13.2 % (ref 11.6–15.4)
WBC: 6.6 10*3/uL (ref 3.4–10.8)

## 2023-09-13 LAB — COMPREHENSIVE METABOLIC PANEL WITH GFR
ALT: 69 IU/L — ABNORMAL HIGH (ref 0–44)
AST: 36 IU/L (ref 0–40)
Albumin: 4.2 g/dL (ref 4.1–5.1)
Alkaline Phosphatase: 63 IU/L (ref 44–121)
BUN/Creatinine Ratio: 11 (ref 9–20)
BUN: 9 mg/dL (ref 6–24)
Bilirubin Total: 0.3 mg/dL (ref 0.0–1.2)
CO2: 21 mmol/L (ref 20–29)
Calcium: 8.9 mg/dL (ref 8.7–10.2)
Chloride: 101 mmol/L (ref 96–106)
Creatinine, Ser: 0.82 mg/dL (ref 0.76–1.27)
Globulin, Total: 2.3 g/dL (ref 1.5–4.5)
Glucose: 99 mg/dL (ref 70–99)
Potassium: 4 mmol/L (ref 3.5–5.2)
Sodium: 138 mmol/L (ref 134–144)
Total Protein: 6.5 g/dL (ref 6.0–8.5)
eGFR: 108 mL/min/{1.73_m2} (ref >59)

## 2023-09-13 LAB — CARBAMAZEPINE LEVEL, TOTAL: Carbamazepine (Tegretol), S: 3.1 ug/mL — ABNORMAL LOW (ref 4.0–12.0)

## 2023-09-15 LAB — SPECIMEN STATUS REPORT

## 2023-09-15 LAB — VALPROIC ACID LEVEL: Valproic Acid Lvl: 44 ug/mL — ABNORMAL LOW (ref 50–100)

## 2023-10-13 ENCOUNTER — Other Ambulatory Visit (HOSPITAL_COMMUNITY): Payer: Self-pay | Admitting: Psychiatry

## 2023-11-26 ENCOUNTER — Telehealth: Payer: Self-pay | Admitting: Cardiology

## 2023-11-26 NOTE — Telephone Encounter (Signed)
*  STAT* If patient is at the pharmacy, call can be transferred to refill team.   1. Which medications need to be refilled? (please list name of each medication and dose if known) nitroGLYCERIN  (NITROSTAT ) 0.4 MG SL tablet    4. Which pharmacy/location (including street and city if local pharmacy) is medication to be sent to?  [id number] [place],  - [address] Phone: [phone]  Fax: [phone]       5. Do they need a 30 day or 90 day supply? 90

## 2024-01-08 ENCOUNTER — Ambulatory Visit (HOSPITAL_BASED_OUTPATIENT_CLINIC_OR_DEPARTMENT_OTHER): Admitting: Psychiatry

## 2024-01-08 ENCOUNTER — Other Ambulatory Visit (HOSPITAL_COMMUNITY): Payer: Self-pay

## 2024-01-08 ENCOUNTER — Encounter (HOSPITAL_COMMUNITY): Payer: Self-pay | Admitting: Psychiatry

## 2024-01-08 ENCOUNTER — Other Ambulatory Visit: Payer: Self-pay

## 2024-01-08 VITALS — BP 125/79 | HR 86 | Ht 72.0 in | Wt 273.0 lb

## 2024-01-08 DIAGNOSIS — F01518 Vascular dementia, unspecified severity, with other behavioral disturbance: Secondary | ICD-10-CM | POA: Diagnosis not present

## 2024-01-08 DIAGNOSIS — Z79899 Other long term (current) drug therapy: Secondary | ICD-10-CM

## 2024-01-08 MED ORDER — ALPRAZOLAM ER 0.5 MG PO TB24: 0.5000 mg | ORAL_TABLET | Freq: Every day | ORAL | 4 refills | Status: DC

## 2024-01-08 MED ORDER — DOXEPIN HCL 25 MG PO CAPS: ORAL_CAPSULE | ORAL | 4 refills | Status: DC

## 2024-01-08 MED ORDER — CARBAMAZEPINE ER 100 MG PO TB12: ORAL_TABLET | ORAL | 6 refills | Status: DC

## 2024-01-08 NOTE — Progress Notes (Signed)
 BH MD/PA/NP OP Progress Note  01/08/2024 4:04 PM Roy Koch  MRN:  [id number]    Visit Diagnosis: Mild neurocognitive disorder   Today the patient is only doing fairly well.  His irritability seems to increase.  He is related to her chronic sleep problem but also chronic pain.  The patient is still bothered a great deal by his adult children.  Today we will look at his Tegretol  level and realize that is on the low side.  Today we can increase his Tegretol  to 200 mg in the morning to 100 mg at night.  The low dose of Xanax  that he takes every day at night is very helpful.  He also takes 3 doxepin 's which states that helps his sleep somewhat.  We will go ahead and increase his Tegretol  and then in 3 weeks to get a Tegretol  blood level.  He will return to see me in 7 weeks. Past Medical History:  Past Medical History:  Diagnosis Date   Anxiety    Bipolar disorder (HCC)    Bipolar disorder (HCC)    Cardiac abnormality    Chest discomfort 06/18/2018   Chest tightness 10/11/2020   Cigarette smoker 06/18/2018   COPD (chronic obstructive pulmonary disease) (HCC)    Coronary artery calcification of native artery 10/09/2021   Elevated coronary artery calcium  score 12/13/2020   Essential hypertension 06/18/2018   Generalized anxiety disorder 09/15/2017   Headache    Heart defect, congenital    History of COVID-19    History of CVA (cerebrovascular accident) 10/09/2021   History of repair of congenital atrial septal defect (ASD) 06/18/2018   History of TIA (transient ischemic attack)    History of tobacco use    Hypertension    Hypertension, essential, benign    Low back pain 07/14/2013   Migraine headache    Mixed dyslipidemia 08/05/2020   Mixed hyperlipidemia 10/09/2021   Neck pain    Palpitations    Pre-op evaluation 10/09/2021   Stroke (HCC)    Tobacco abuse    Ventricular septal defect     Past Surgical History:  Procedure Laterality Date   AMPUTATION FINGER / THUMB     CARDIAC SURGERY   at 48 years old    Family Psychiatric History: See intake H&P for full details. Reviewed, with no updates at this time.   Family History:  Family History  Problem Relation Age of Onset   Schizophrenia Father    Bipolar disorder Maternal Uncle    Bipolar disorder Maternal Grandmother     Social History:  Social History   Socioeconomic History   Marital status: Divorced    Spouse name: Not on file   Number of children: 2   Years of education: Not on file   Highest education level: 11th grade  Occupational History   Not on file  Tobacco Use   Smoking status: Every Day    Current packs/day: 1.00    Types: Cigarettes   Smokeless tobacco: Never  Vaping Use   Vaping status: Never Used  Substance and Sexual Activity   Alcohol use: No   Drug use: No   Sexual activity: Not on file  Other Topics Concern   Not on file  Social History Narrative   Not on file   Social Drivers of Health   Financial Resource Strain: Low Risk  (11/14/2023)   Received from [facility]   Overall Financial Resource Strain (CARDIA)    Difficulty of Paying Living Expenses: Not  hard at all  Food Insecurity: No Food Insecurity (11/14/2023)   Received from [facility]   Hunger Vital Sign    Within the past 12 months, you worried that your food would run out before you got the money to buy more.: Never true    Within the past 12 months, the food you bought just didn't last and you didn't have money to get more.: Never true  Transportation Needs: No Transportation Needs (11/14/2023)   Received from [facility]   PRAPARE - Transportation    Lack of Transportation (Medical): No    Lack of Transportation (Non-Medical): No  Physical Activity: Insufficiently Active (04/18/2018)   Exercise Vital Sign    Days of Exercise per Week: 7 days    Minutes of Exercise per Session: 20 min  Stress: Stress Concern Present (04/18/2018)   [name] of Occupational Health - Occupational Stress Questionnaire     Feeling of Stress : To some extent  Social Connections: Unknown (09/29/2021)   Received from [facility]   Social Network    Social Network: Not on file    Allergies:  Allergies  Allergen Reactions   Crestor  [Rosuvastatin ]     Body cramps    Metabolic Disorder Labs: No results found for: HGBA1C, MPG No results found for: PROLACTIN Lab Results  Component Value Date   CHOL 208 (H) 06/13/2022   TRIG 125 06/13/2022   HDL 39 (L) 06/13/2022   CHOLHDL 5.3 (H) 06/13/2022   LDLCALC 146 (H) 06/13/2022   LDLCALC 104 (H) 03/08/2021   No results found for: TSH  Therapeutic Level Labs: No results found for: LITHIUM Lab Results  Component Value Date   VALPROATE 44 (L) 09/11/2023   Lab Results  Component Value Date   CBMZ 3.1 (L) 09/11/2023    Current Medications: Current Outpatient Medications  Medication Sig Dispense Refill   albuterol  (PROVENTIL  HFA;VENTOLIN  HFA) 108 (90 Base) MCG/ACT inhaler Inhale 1-2 puffs into the lungs daily as needed for wheezing or shortness of breath.     amLODipine  (NORVASC ) 5 MG tablet Take 1 tablet (5 mg total) by mouth daily. 90 tablet 3   aspirin EC 81 MG tablet Take 81 mg by mouth daily. Swallow whole.     cholecalciferol (VITAMIN D3) 25 MCG (1000 UNIT) tablet Take 1,000 Units by mouth daily.     Coenzyme Q10 (HM COQ-10) 200 MG capsule Take 1 capsule (200 mg total) by mouth daily. 90 capsule 3   divalproex (DEPAKOTE ER) 500 MG 24 hr tablet Take 500 mg by mouth daily.     Evolocumab  (REPATHA  SURECLICK) 140 MG/ML SOAJ Inject 140 mg into the skin every 14 (fourteen) days. 6 mL 2   lisinopril  (ZESTRIL ) 40 MG tablet Take 0.5 tablets (20 mg total) by mouth daily. 45 tablet 2   nitroGLYCERIN  (NITROSTAT ) 0.4 MG SL tablet Place 0.4 mg under the tongue every 5 (five) minutes as needed for chest pain.     omega-3 acid ethyl esters (LOVAZA ) 1 g capsule Take 2 capsules (2 g total) by mouth 2 (two) times daily. Patient needs appointment for further  refills. 1 st attempt 120 capsule 0   SYMBICORT 160-4.5 MCG/ACT inhaler Inhale 2 puffs into the lungs 2 (two) times daily.     ALPRAZolam  (XANAX  XR) 0.5 MG 24 hr tablet Take 1 tablet (0.5 mg total) by mouth at bedtime. 30 tablet 4   carbamazepine  (TEGRETOL  XR) 100 MG 12 hr tablet 1 qam  2  qhs 90 tablet  6   doxepin  (SINEQUAN ) 25 MG capsule 3 qhs 90 capsule 4   No current facility-administered medications for this visit.    Musculoskeletal: Strength & Muscle Tone: within normal limits Gait & Station: normal Patient leans: N/A  Psychiatric Specialty Exam: ROS BH MD/PA/NP OP Progress Note  01/08/2024 4:04 PM Roy Koch  MRN:  [id number]  Chief Complaint: Irritability  HPI:  At this time the patient will go ahead and increase his Tegretol .  His diagnosis is vascular dementia.  We will get a Tegretol  level 3 weeks after the dose is increased and return to see me in approximately 2 months.  We will continue taking doxepin  25 mg 3 at night for sleep and Xanax .   Visit Diagnosis: Mild neurocognitive d Past Psychiatric History: See intake H&P for full details. Reviewed, with no updates at this time.   Past Medical History:  Past Medical History:  Diagnosis Date   Anxiety    Bipolar disorder (HCC)    Bipolar disorder (HCC)    Cardiac abnormality    Chest discomfort 06/18/2018   Chest tightness 10/11/2020   Cigarette smoker 06/18/2018   COPD (chronic obstructive pulmonary disease) (HCC)    Coronary artery calcification of native artery 10/09/2021   Elevated coronary artery calcium  score 12/13/2020   Essential hypertension 06/18/2018   Generalized anxiety disorder 09/15/2017   Headache    Heart defect, congenital    History of COVID-19    History of CVA (cerebrovascular accident) 10/09/2021   History of repair of congenital atrial septal defect (ASD) 06/18/2018   History of TIA (transient ischemic attack)    History of tobacco use    Hypertension    Hypertension, essential, benign     Low back pain 07/14/2013   Migraine headache    Mixed dyslipidemia 08/05/2020   Mixed hyperlipidemia 10/09/2021   Neck pain    Palpitations    Pre-op evaluation 10/09/2021   Stroke (HCC)    Tobacco abuse    Ventricular septal defect     Past Surgical History:  Procedure Laterality Date   AMPUTATION FINGER / THUMB     CARDIAC SURGERY  at 48 years old    Family Psychiatric History: See intake H&P for full details. Reviewed, with no updates at this time.   Family History:  Family History  Problem Relation Age of Onset   Schizophrenia Father    Bipolar disorder Maternal Uncle    Bipolar disorder Maternal Grandmother     Social History:  Social History   Socioeconomic History   Marital status: Divorced    Spouse name: Not on file   Number of children: 2   Years of education: Not on file   Highest education level: 11th grade  Occupational History   Not on file  Tobacco Use   Smoking status: Every Day    Current packs/day: 1.00    Types: Cigarettes   Smokeless tobacco: Never  Vaping Use   Vaping status: Never Used  Substance and Sexual Activity   Alcohol use: No   Drug use: No   Sexual activity: Not on file  Other Topics Concern   Not on file  Social History Narrative   Not on file   Social Drivers of Health   Financial Resource Strain: Low Risk  (11/14/2023)   Received from [facility]   Overall Financial Resource Strain (CARDIA)    Difficulty of Paying Living Expenses: Not hard at all  Food Insecurity: No Food Insecurity (11/14/2023)  Received from [facility]   Hunger Vital Sign    Within the past 12 months, you worried that your food would run out before you got the money to buy more.: Never true    Within the past 12 months, the food you bought just didn't last and you didn't have money to get more.: Never true  Transportation Needs: No Transportation Needs (11/14/2023)   Received from [facility]   PRAPARE - Transportation    Lack of  Transportation (Medical): No    Lack of Transportation (Non-Medical): No  Physical Activity: Insufficiently Active (04/18/2018)   Exercise Vital Sign    Days of Exercise per Week: 7 days    Minutes of Exercise per Session: 20 min  Stress: Stress Concern Present (04/18/2018)   [name] of Occupational Health - Occupational Stress Questionnaire    Feeling of Stress : To some extent  Social Connections: Unknown (09/29/2021)   Received from [facility]   Social Network    Social Network: Not on file    Allergies:  Allergies  Allergen Reactions   Crestor  [Rosuvastatin ]     Body cramps    Metabolic Disorder Labs: No results found for: HGBA1C, MPG No results found for: PROLACTIN Lab Results  Component Value Date   CHOL 208 (H) 06/13/2022   TRIG 125 06/13/2022   HDL 39 (L) 06/13/2022   CHOLHDL 5.3 (H) 06/13/2022   LDLCALC 146 (H) 06/13/2022   LDLCALC 104 (H) 03/08/2021   No results found for: TSH  Therapeutic Level Labs: No results found for: LITHIUM Lab Results  Component Value Date   VALPROATE 44 (L) 09/11/2023   Lab Results  Component Value Date   CBMZ 3.1 (L) 09/11/2023    Current Medications: Current Outpatient Medications  Medication Sig Dispense Refill   albuterol  (PROVENTIL  HFA;VENTOLIN  HFA) 108 (90 Base) MCG/ACT inhaler Inhale 1-2 puffs into the lungs daily as needed for wheezing or shortness of breath.     amLODipine  (NORVASC ) 5 MG tablet Take 1 tablet (5 mg total) by mouth daily. 90 tablet 3   aspirin EC 81 MG tablet Take 81 mg by mouth daily. Swallow whole.     cholecalciferol (VITAMIN D3) 25 MCG (1000 UNIT) tablet Take 1,000 Units by mouth daily.     Coenzyme Q10 (HM COQ-10) 200 MG capsule Take 1 capsule (200 mg total) by mouth daily. 90 capsule 3   divalproex (DEPAKOTE ER) 500 MG 24 hr tablet Take 500 mg by mouth daily.     Evolocumab  (REPATHA  SURECLICK) 140 MG/ML SOAJ Inject 140 mg into the skin every 14 (fourteen) days. 6 mL 2    lisinopril  (ZESTRIL ) 40 MG tablet Take 0.5 tablets (20 mg total) by mouth daily. 45 tablet 2   nitroGLYCERIN  (NITROSTAT ) 0.4 MG SL tablet Place 0.4 mg under the tongue every 5 (five) minutes as needed for chest pain.     omega-3 acid ethyl esters (LOVAZA ) 1 g capsule Take 2 capsules (2 g total) by mouth 2 (two) times daily. Patient needs appointment for further refills. 1 st attempt 120 capsule 0   SYMBICORT 160-4.5 MCG/ACT inhaler Inhale 2 puffs into the lungs 2 (two) times daily.     ALPRAZolam  (XANAX  XR) 0.5 MG 24 hr tablet Take 1 tablet (0.5 mg total) by mouth at bedtime. 30 tablet 4   carbamazepine  (TEGRETOL  XR) 100 MG 12 hr tablet 1 qam  2  qhs 90 tablet 6   doxepin  (SINEQUAN ) 25 MG capsule 3 qhs 90 capsule  4   No current facility-administered medications for this visit.    Musculoskeletal: Strength & Muscle Tone: within normal limits Gait & Station: normal Patient leans: N/A  Psychiatric Specialty Exam: ROS  Blood pressure 125/79, pulse 86, height 6' (1.829 m), weight 273 lb (123.8 kg).Body mass index is 37.03 kg/m.  General Appearance: Casual and Disheveled  Eye Contact:  Fair  Speech:  Clear and Coherent and Normal Rate  Volume:  Normal  Mood:  Less irritable  Affect:  Appropriate and Congruent  Thought Process:  Goal Directed and Descriptions of Associations: Intact  Orientation:  Full (Time, Place, and Person)  Thought Content: Logical and Focused on Xanax   Suicidal Thoughts:  No  Homicidal Thoughts:  No  Memory:  Immediate;   Poor  Judgement:  Fair  Insight:  Present, Shallow and Improving  Psychomotor Activity:  Normal  Concentration:  Concentration: Fair  Recall:  [employer] of Knowledge: Fair  Language: Fair  Akathisia:  Negative  Handed:  Right  AIMS (if indicated): not done  Assets:  Communication Skills Desire for Improvement  ADL's:  Intact  Cognition: WNL  Sleep:  Fair   Screenings: PHQ2-9    Flowsheet Row Patient Outreach Telephone from  03/12/2017 in [facility]  PHQ-2 Total Score 2  PHQ-9 Total Score 4     Assessment and Plan     This patient's diagnosis is mild neurocognitive disorder.  His irritability is well controlled with Tegretol.  He also takes Xanax 0.5 mg slow release at night.  This is related to his second problem which is insomnia.  Today we are going to increase his doxepin to taking 3 at night.  This will be a total of 75 mg.  Importantly the patient also is going to be starting on a CPAP machine which I hope will help his sleep irritability and has positive effects on his cardiovascular system.  He has had multiple MIs and strokes.  Overall though emotionally he is fairly stable. Labs Ordered: No orders of the defined types were placed in this encounter.   Labs Reviewed: na  Collateral Obtained/Records Reviewed: Wife is present and able to corroborate episodic agitation and explosive behaviors when he is angry  Plan:    Equetro 100 mg twice daily.  The patient return to see us  in 2-1/2 months and shared how often he is been explosive.  At that time we will get blood work including a comprehensive metabolic panel.  Elna LILLETTE Lo, MD 01/08/2024, 4:04 PM  Blood pressure 125/79, pulse 86, height 6' (1.829 m), weight 273 lb (123.8 kg).Body mass index is 37.03 kg/m.  General Appearance: Casual and Disheveled  Eye Contact:  Fair  Speech:  Clear and Coherent and Normal Rate  Volume:  Normal  Mood:  Less irritable  Affect:  Appropriate and Congruent  Thought Process:  Goal Directed and Descriptions of Associations: Intact  Orientation:  Full (Time, Place, and Person)  Thought Content: Logical and Focused on Xanax   Suicidal Thoughts:  No  Homicidal Thoughts:  No  Memory:  Immediate;   Poor  Judgement:  Fair  Insight:  Present, Shallow and Improving  Psychomotor Activity:  Normal  Concentration:  Concentration: Fair  Recall:  [employer] of Knowledge: Fair  Language: Fair   Akathisia:  Negative  Handed:  Right  AIMS (if indicated): not done  Assets:  Communication Skills Desire for Improvement  ADL's:  Intact  Cognition: WNL  Sleep:  Fair  Screenings: PHQ2-9    Flowsheet Row Patient Outreach Telephone from 03/12/2017 in [facility]  PHQ-2 Total Score 2  PHQ-9 Total Score 4     Assessment and Plan   This patient's diagnosis is vascular dementia.  He has issues with impulses but they are well-controlled taking Tegretol  twice a day.  He also takes some Xanax  long-acting 0.5 mg every night which helps him sleep and reduce his anxiety.  Today her intervention is to double his doxepin  to take 50 mg instead of 25 mg.  He will be asked to come back in just 2 months.  It is my hope that with better sleep he will be even less irritable.  He has not exploded into violence but just feels more irritable and upset with things.  He continues to work full-time. Status of current problems: new to [employer] Ordered: No orders of the defined types were placed in this encounter.   Labs Reviewed: na  Collateral Obtained/Records Reviewed: Wife is present and able to corroborate episodic agitation and explosive behaviors when he is angry  Plan:    Equetro  100 mg twice daily.  The patient return to see us  in 2-1/2 months and shared how often he is been explosive.  At that time we will get blood work including a comprehensive metabolic panel.  Elna LILLETTE Lo, MD 01/08/2024, 4:04 PM

## 2024-02-26 ENCOUNTER — Ambulatory Visit (HOSPITAL_COMMUNITY): Admitting: Psychiatry

## 2024-03-01 ENCOUNTER — Other Ambulatory Visit (HOSPITAL_COMMUNITY): Payer: Self-pay | Admitting: Psychiatry

## 2024-03-23 ENCOUNTER — Other Ambulatory Visit (HOSPITAL_COMMUNITY): Payer: Self-pay | Admitting: Psychiatry

## 2024-04-22 ENCOUNTER — Ambulatory Visit: Admitting: Cardiology

## 2024-04-25 ENCOUNTER — Other Ambulatory Visit: Payer: Self-pay | Admitting: Cardiology

## 2024-04-29 ENCOUNTER — Telehealth: Payer: Self-pay | Admitting: Cardiology

## 2024-04-29 NOTE — Telephone Encounter (Signed)
*  STAT* If patient is at the pharmacy, call can be transferred to refill team.   1. Which medications need to be refilled? (please list name of each medication and dose if known)   lisinopril  (ZESTRIL ) 40 MG tablet     2. Would you like to learn more about the convenience, safety, & potential cost savings by using the [facility]? NO    3. Are you open to using the [facility] (Type [facility]. No    4. Which pharmacy/location (including street and city if local pharmacy) is medication to be sent to? [id number] - [place], [place] - [address]     5. Do they need a 30 day or 90 day supply? 90 day   Pt is out of medication.

## 2024-04-30 ENCOUNTER — Other Ambulatory Visit: Payer: Self-pay

## 2024-04-30 ENCOUNTER — Encounter (HOSPITAL_COMMUNITY): Payer: Self-pay | Admitting: Psychiatry

## 2024-04-30 ENCOUNTER — Ambulatory Visit (HOSPITAL_COMMUNITY): Admitting: Psychiatry

## 2024-04-30 ENCOUNTER — Other Ambulatory Visit (HOSPITAL_COMMUNITY): Payer: Self-pay

## 2024-04-30 VITALS — BP 145/90 | HR 89 | Ht 72.0 in | Wt 277.0 lb

## 2024-04-30 DIAGNOSIS — F0630 Mood disorder due to known physiological condition, unspecified: Secondary | ICD-10-CM

## 2024-04-30 MED ORDER — ALPRAZOLAM ER 0.5 MG PO TB24: 0.5000 mg | ORAL_TABLET | Freq: Every day | ORAL | 4 refills | Status: AC

## 2024-04-30 MED ORDER — DOXEPIN HCL 25 MG PO CAPS: ORAL_CAPSULE | ORAL | 4 refills | Status: AC

## 2024-04-30 MED ORDER — CARBAMAZEPINE ER 100 MG PO TB12: ORAL_TABLET | ORAL | 6 refills | Status: AC

## 2024-04-30 MED ORDER — LISINOPRIL 40 MG PO TABS
20.0000 mg | ORAL_TABLET | Freq: Every day | ORAL | 3 refills | Status: DC
  Filled 2024-04-30: qty 45, 90d supply, fill #0

## 2024-04-30 MED ORDER — LISINOPRIL 40 MG PO TABS: 20.0000 mg | ORAL_TABLET | Freq: Every day | ORAL | 3 refills | Status: AC

## 2024-04-30 NOTE — Telephone Encounter (Signed)
 Pt scheduled to see Dr. Edwyna 05/20/24.  Refill sent.

## 2024-04-30 NOTE — Progress Notes (Signed)
 BH MD/PA/NP OP Progress Note  04/30/2024 4:29 PM Roy Koch  MRN:  [id number]    Visit Diagnosis: Mild neurocognitive disorder   Today the patient is seen with his wife.  He is at his baseline.  He has chronic back pain which is still a big problem.  He drinks no alcohol uses no drugs.  His irritability seems to be fairly well-controlled.  He is seen with his wife Darice as well as his grandson.  The patient takes 3 doxepin 's at night which helps him sleep.  He takes Xanax  0.5 mg once a day for anxiety.  The most important intervention is of course Tegretol .  He takes 1 in the morning and 2 at night.  After being 1 in a month he did get some blood work but unfortunately it went to his primary care doctor.  Will contact them to try to get the results to us .  His mood seems to be stable.  There is less anger and irritability.  He seems to be more even. Past Medical History:  Past Medical History:  Diagnosis Date   Anxiety    Bipolar disorder (HCC)    Bipolar disorder (HCC)    Cardiac abnormality    Chest discomfort 06/18/2018   Chest tightness 10/11/2020   Cigarette smoker 06/18/2018   COPD (chronic obstructive pulmonary disease) (HCC)    Coronary artery calcification of native artery 10/09/2021   Elevated coronary artery calcium  score 12/13/2020   Essential hypertension 06/18/2018   Generalized anxiety disorder 09/15/2017   Headache    Heart defect, congenital    History of COVID-19    History of CVA (cerebrovascular accident) 10/09/2021   History of repair of congenital atrial septal defect (ASD) 06/18/2018   History of TIA (transient ischemic attack)    History of tobacco use    Hypertension    Hypertension, essential, benign    Low back pain 07/14/2013   Migraine headache    Mixed dyslipidemia 08/05/2020   Mixed hyperlipidemia 10/09/2021   Neck pain    Palpitations    Pre-op evaluation 10/09/2021   Stroke (HCC)    Tobacco abuse    Ventricular septal defect     Past Surgical  History:  Procedure Laterality Date   AMPUTATION FINGER / THUMB     CARDIAC SURGERY  at 48 years old    Family Psychiatric History: See intake H&P for full details. Reviewed, with no updates at this time.   Family History:  Family History  Problem Relation Age of Onset   Schizophrenia Father    Bipolar disorder Maternal Uncle    Bipolar disorder Maternal Grandmother     Social History:  Social History   Socioeconomic History   Marital status: Divorced    Spouse name: Not on file   Number of children: 2   Years of education: Not on file   Highest education level: 11th grade  Occupational History   Not on file  Tobacco Use   Smoking status: Every Day    Current packs/day: 1.00    Types: Cigarettes   Smokeless tobacco: Never  Vaping Use   Vaping status: Never Used  Substance and Sexual Activity   Alcohol use: No   Drug use: No   Sexual activity: Not on file  Other Topics Concern   Not on file  Social History Narrative   Not on file   Social Drivers of Health   Financial Resource Strain: Low Risk  (11/14/2023)   Received  from [employer]   Overall Financial Resource Strain (CARDIA)    Difficulty of Paying Living Expenses: Not hard at all  Food Insecurity: No Food Insecurity (11/14/2023)   Received from [facility]   Hunger Vital Sign    Within the past 12 months, you worried that your food would run out before you got the money to buy more.: Never true    Within the past 12 months, the food you bought just didn't last and you didn't have money to get more.: Never true  Transportation Needs: No Transportation Needs (11/14/2023)   Received from [facility] - Transportation    Lack of Transportation (Medical): No    Lack of Transportation (Non-Medical): No  Physical Activity: Insufficiently Active (04/18/2018)   Exercise Vital Sign    Days of Exercise per Week: 7 days    Minutes of Exercise per Session: 20 min  Stress: Stress Concern Present  (04/18/2018)   [name] of Occupational Health - Occupational Stress Questionnaire    Feeling of Stress : To some extent  Social Connections: Moderately Isolated (04/18/2018)   Social Connection and Isolation Panel    Frequency of Communication with Friends and Family: More than three times a week    Frequency of Social Gatherings with Friends and Family: More than three times a week    Attends Religious Services: Never    Database Administrator or Organizations: No    Attends Banker Meetings: Never    Marital Status: Divorced    Allergies:  Allergies  Allergen Reactions   Crestor  [Rosuvastatin ]     Body cramps    Metabolic Disorder Labs: No results found for: HGBA1C, MPG No results found for: PROLACTIN Lab Results  Component Value Date   CHOL 208 (H) 06/13/2022   TRIG 125 06/13/2022   HDL 39 (L) 06/13/2022   CHOLHDL 5.3 (H) 06/13/2022   LDLCALC 146 (H) 06/13/2022   LDLCALC 104 (H) 03/08/2021   No results found for: TSH  Therapeutic Level Labs: No results found for: LITHIUM Lab Results  Component Value Date   VALPROATE 44 (L) 09/11/2023   Lab Results  Component Value Date   CBMZ 3.1 (L) 09/11/2023    Current Medications: Current Outpatient Medications  Medication Sig Dispense Refill   meloxicam (MOBIC) 7.5 MG tablet Take 7.5 mg by mouth 2 (two) times daily.     albuterol  (PROVENTIL  HFA;VENTOLIN  HFA) 108 (90 Base) MCG/ACT inhaler Inhale 1-2 puffs into the lungs daily as needed for wheezing or shortness of breath.     ALPRAZolam  (XANAX  XR) 0.5 MG 24 hr tablet Take 1 tablet (0.5 mg total) by mouth at bedtime. 30 tablet 4   amLODipine  (NORVASC ) 5 MG tablet Take 1 tablet (5 mg total) by mouth daily. 90 tablet 3   aspirin EC 81 MG tablet Take 81 mg by mouth daily. Swallow whole.     carbamazepine  (TEGRETOL  XR) 100 MG 12 hr tablet 1 qam  2  qhs 90 tablet 6   cholecalciferol (VITAMIN D3) 25 MCG (1000 UNIT) tablet Take 1,000 Units by mouth  daily.     Coenzyme Q10 (HM COQ-10) 200 MG capsule Take 1 capsule (200 mg total) by mouth daily. 90 capsule 3   divalproex (DEPAKOTE ER) 500 MG 24 hr tablet Take 500 mg by mouth daily.     doxepin  (SINEQUAN ) 25 MG capsule 3 qhs 90 capsule 4   Evolocumab  (REPATHA  SURECLICK) 140 MG/ML SOAJ Inject 140 mg into  the skin every 14 (fourteen) days. 6 mL 2   lisinopril  (ZESTRIL ) 40 MG tablet Take 0.5 tablets (20 mg total) by mouth daily. 45 tablet 3   nitroGLYCERIN  (NITROSTAT ) 0.4 MG SL tablet Place 0.4 mg under the tongue every 5 (five) minutes as needed for chest pain.     omega-3 acid ethyl esters (LOVAZA ) 1 g capsule Take 2 capsules (2 g total) by mouth 2 (two) times daily. Patient needs appointment for further refills. 1 st attempt 120 capsule 0   SYMBICORT 160-4.5 MCG/ACT inhaler Inhale 2 puffs into the lungs 2 (two) times daily.     No current facility-administered medications for this visit.    Musculoskeletal: Strength & Muscle Tone: within normal limits Gait & Station: normal Patient leans: N/A  Psychiatric Specialty Exam: ROS BH MD/PA/NP OP Progress Note  04/30/2024 4:29 PM Roy Koch  MRN:  [id number]  Chief Complaint: Irritability  HPI:  At this time the patient will go ahead and increase his Tegretol .  His diagnosis is vascular dementia.  We will get a Tegretol  level 3 weeks after the dose is increased and return to see me in approximately 2 months.  We will continue taking doxepin  25 mg 3 at night for sleep and Xanax .   Visit Diagnosis: Mild neurocognitive d Past Psychiatric History: See intake H&P for full details. Reviewed, with no updates at this time.   Past Medical History:  Past Medical History:  Diagnosis Date   Anxiety    Bipolar disorder (HCC)    Bipolar disorder (HCC)    Cardiac abnormality    Chest discomfort 06/18/2018   Chest tightness 10/11/2020   Cigarette smoker 06/18/2018   COPD (chronic obstructive pulmonary disease) (HCC)    Coronary artery  calcification of native artery 10/09/2021   Elevated coronary artery calcium  score 12/13/2020   Essential hypertension 06/18/2018   Generalized anxiety disorder 09/15/2017   Headache    Heart defect, congenital    History of COVID-19    History of CVA (cerebrovascular accident) 10/09/2021   History of repair of congenital atrial septal defect (ASD) 06/18/2018   History of TIA (transient ischemic attack)    History of tobacco use    Hypertension    Hypertension, essential, benign    Low back pain 07/14/2013   Migraine headache    Mixed dyslipidemia 08/05/2020   Mixed hyperlipidemia 10/09/2021   Neck pain    Palpitations    Pre-op evaluation 10/09/2021   Stroke (HCC)    Tobacco abuse    Ventricular septal defect     Past Surgical History:  Procedure Laterality Date   AMPUTATION FINGER / THUMB     CARDIAC SURGERY  at 48 years old    Family Psychiatric History: See intake H&P for full details. Reviewed, with no updates at this time.   Family History:  Family History  Problem Relation Age of Onset   Schizophrenia Father    Bipolar disorder Maternal Uncle    Bipolar disorder Maternal Grandmother     Social History:  Social History   Socioeconomic History   Marital status: Divorced    Spouse name: Not on file   Number of children: 2   Years of education: Not on file   Highest education level: 11th grade  Occupational History   Not on file  Tobacco Use   Smoking status: Every Day    Current packs/day: 1.00    Types: Cigarettes   Smokeless tobacco: Never  Vaping Use  Vaping status: Never Used  Substance and Sexual Activity   Alcohol use: No   Drug use: No   Sexual activity: Not on file  Other Topics Concern   Not on file  Social History Narrative   Not on file   Social Drivers of Health   Financial Resource Strain: Low Risk  (11/14/2023)   Received from [facility]   Overall Financial Resource Strain (CARDIA)    Difficulty of Paying Living Expenses: Not hard at  all  Food Insecurity: No Food Insecurity (11/14/2023)   Received from [facility]   Hunger Vital Sign    Within the past 12 months, you worried that your food would run out before you got the money to buy more.: Never true    Within the past 12 months, the food you bought just didn't last and you didn't have money to get more.: Never true  Transportation Needs: No Transportation Needs (11/14/2023)   Received from [facility] - Transportation    Lack of Transportation (Medical): No    Lack of Transportation (Non-Medical): No  Physical Activity: Insufficiently Active (04/18/2018)   Exercise Vital Sign    Days of Exercise per Week: 7 days    Minutes of Exercise per Session: 20 min  Stress: Stress Concern Present (04/18/2018)   [name] of Occupational Health - Occupational Stress Questionnaire    Feeling of Stress : To some extent  Social Connections: Moderately Isolated (04/18/2018)   Social Connection and Isolation Panel    Frequency of Communication with Friends and Family: More than three times a week    Frequency of Social Gatherings with Friends and Family: More than three times a week    Attends Religious Services: Never    Database Administrator or Organizations: No    Attends Banker Meetings: Never    Marital Status: Divorced    Allergies:  Allergies  Allergen Reactions   Crestor  [Rosuvastatin ]     Body cramps    Metabolic Disorder Labs: No results found for: HGBA1C, MPG No results found for: PROLACTIN Lab Results  Component Value Date   CHOL 208 (H) 06/13/2022   TRIG 125 06/13/2022   HDL 39 (L) 06/13/2022   CHOLHDL 5.3 (H) 06/13/2022   LDLCALC 146 (H) 06/13/2022   LDLCALC 104 (H) 03/08/2021   No results found for: TSH  Therapeutic Level Labs: No results found for: LITHIUM Lab Results  Component Value Date   VALPROATE 44 (L) 09/11/2023   Lab Results  Component Value Date   CBMZ 3.1 (L) 09/11/2023    Current  Medications: Current Outpatient Medications  Medication Sig Dispense Refill   meloxicam (MOBIC) 7.5 MG tablet Take 7.5 mg by mouth 2 (two) times daily.     albuterol  (PROVENTIL  HFA;VENTOLIN  HFA) 108 (90 Base) MCG/ACT inhaler Inhale 1-2 puffs into the lungs daily as needed for wheezing or shortness of breath.     ALPRAZolam  (XANAX  XR) 0.5 MG 24 hr tablet Take 1 tablet (0.5 mg total) by mouth at bedtime. 30 tablet 4   amLODipine  (NORVASC ) 5 MG tablet Take 1 tablet (5 mg total) by mouth daily. 90 tablet 3   aspirin EC 81 MG tablet Take 81 mg by mouth daily. Swallow whole.     carbamazepine  (TEGRETOL  XR) 100 MG 12 hr tablet 1 qam  2  qhs 90 tablet 6   cholecalciferol (VITAMIN D3) 25 MCG (1000 UNIT) tablet Take 1,000 Units by mouth daily.  Coenzyme Q10 (HM COQ-10) 200 MG capsule Take 1 capsule (200 mg total) by mouth daily. 90 capsule 3   divalproex (DEPAKOTE ER) 500 MG 24 hr tablet Take 500 mg by mouth daily.     doxepin  (SINEQUAN ) 25 MG capsule 3 qhs 90 capsule 4   Evolocumab  (REPATHA  SURECLICK) 140 MG/ML SOAJ Inject 140 mg into the skin every 14 (fourteen) days. 6 mL 2   lisinopril  (ZESTRIL ) 40 MG tablet Take 0.5 tablets (20 mg total) by mouth daily. 45 tablet 3   nitroGLYCERIN  (NITROSTAT ) 0.4 MG SL tablet Place 0.4 mg under the tongue every 5 (five) minutes as needed for chest pain.     omega-3 acid ethyl esters (LOVAZA ) 1 g capsule Take 2 capsules (2 g total) by mouth 2 (two) times daily. Patient needs appointment for further refills. 1 st attempt 120 capsule 0   SYMBICORT 160-4.5 MCG/ACT inhaler Inhale 2 puffs into the lungs 2 (two) times daily.     No current facility-administered medications for this visit.    Musculoskeletal: Strength & Muscle Tone: within normal limits Gait & Station: normal Patient leans: N/A  Psychiatric Specialty Exam: ROS  Blood pressure (!) 145/90, pulse 89, height 6' (1.829 m), weight 277 lb (125.6 kg).Body mass index is 37.57 kg/m.  General Appearance:  Casual and Disheveled  Eye Contact:  Fair  Speech:  Clear and Coherent and Normal Rate  Volume:  Normal  Mood:  Less irritable  Affect:  Appropriate and Congruent  Thought Process:  Goal Directed and Descriptions of Associations: Intact  Orientation:  Full (Time, Place, and Person)  Thought Content: Logical and Focused on Xanax    Suicidal Thoughts:  No  Homicidal Thoughts:  No  Memory:  Immediate;   Poor  Judgement:  Fair  Insight:  Present, Shallow and Improving  Psychomotor Activity:  Normal  Concentration:  Concentration: Fair  Recall:  [employer] of Knowledge: Fair  Language: Fair  Akathisia:  Negative  Handed:  Right  AIMS (if indicated): not done  Assets:  Communication Skills Desire for Improvement  ADL's:  Intact  Cognition: WNL  Sleep:  Fair   Screenings: PHQ2-9    Flowsheet Row Patient Outreach Telephone from 03/12/2017 in [facility]  PHQ-2 Total Score 2  PHQ-9 Total Score 4     Assessment and Plan    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  The patient has done very well taking Tegretol  which has helped his mood instability.  Doxepin  helps his sleep.  And he takes a rare Xanax  pill perhaps once a day.  His wife said he is stable.  Doing fairly well.  He will return again to see me in 3 months.  By that time we will have the blood work that he already had drawn at his primary care physician.  I believe he is stable at this time. Labs Ordered: No orders of the defined types were placed in this encounter.   Labs Reviewed: na  Collateral Obtained/Records Reviewed: Wife is present and able to corroborate episodic agitation and explosive behaviors when he is angry  Plan:    Equetro  100 mg twice daily.  The patient return to see us  in 2-1/2 months and shared how often he is been explosive.  At that time we will get blood work including a comprehensive metabolic panel.  Elna LILLETTE Lo, MD 04/30/2024, 4:29 PM  Blood  pressure (!) 145/90, pulse 89, height 6' (1.829 m), weight 277 lb (125.6  kg).Body mass index is 37.57 kg/m.  General Appearance: Casual and Disheveled  Eye Contact:  Fair  Speech:  Clear and Coherent and Normal Rate  Volume:  Normal  Mood:  Less irritable  Affect:  Appropriate and Congruent  Thought Process:  Goal Directed and Descriptions of Associations: Intact  Orientation:  Full (Time, Place, and Person)  Thought Content: Logical and Focused on Xanax    Suicidal Thoughts:  No  Homicidal Thoughts:  No  Memory:  Immediate;   Poor  Judgement:  Fair  Insight:  Present, Shallow and Improving  Psychomotor Activity:  Normal  Concentration:  Concentration: Fair  Recall:  [employer] of Knowledge: Fair  Language: Fair  Akathisia:  Negative  Handed:  Right  AIMS (if indicated): not done  Assets:  Communication Skills Desire for Improvement  ADL's:  Intact  Cognition: WNL  Sleep:  Fair   Screenings: PHQ2-9    Flowsheet Row Patient Outreach Telephone from 03/12/2017 in [facility]  PHQ-2 Total Score 2  PHQ-9 Total Score 4     Assessment and Plan   This patient's diagnosis is vascular dementia.  He has issues with impulses but they are well-controlled taking Tegretol  twice a day.  He also takes some Xanax  long-acting 0.5 mg every night which helps him sleep and reduce his anxiety.  Today her intervention is to double his doxepin  to take 50 mg instead of 25 mg.  He will be asked to come back in just 2 months.  It is my hope that with better sleep he will be even less irritable.  He has not exploded into violence but just feels more irritable and upset with things.  He continues to work full-time. Status of current problems: new to [employer] Ordered: No orders of the defined types were placed in this encounter.   Labs Reviewed: na  Collateral Obtained/Records Reviewed: Wife is present and able to corroborate episodic agitation and explosive behaviors when he is  angry  Plan:    Equetro  100 mg twice daily.  The patient return to see us  in 2-1/2 months and shared how often he is been explosive.  At that time we will get blood work including a comprehensive metabolic panel.  Elna LILLETTE Lo, MD 04/30/2024, 4:29 PM

## 2024-05-01 ENCOUNTER — Ambulatory Visit: Admitting: Cardiology

## 2024-05-20 ENCOUNTER — Ambulatory Visit: Admitting: Cardiology

## 2024-05-22 ENCOUNTER — Other Ambulatory Visit: Payer: Self-pay | Admitting: Cardiology

## 2024-05-22 DIAGNOSIS — E782 Mixed hyperlipidemia: Secondary | ICD-10-CM

## 2024-05-23 DIAGNOSIS — E781 Pure hyperglyceridemia: Secondary | ICD-10-CM | POA: Insufficient documentation

## 2024-05-26 ENCOUNTER — Ambulatory Visit: Attending: Cardiology | Admitting: Cardiology

## 2024-07-30 ENCOUNTER — Ambulatory Visit (HOSPITAL_COMMUNITY): Admitting: Psychiatry
